# Patient Record
Sex: Male | Born: 1940 | Race: White | Hispanic: No | Marital: Married | Smoking: Former smoker
Health system: Southern US, Community
[De-identification: ages and names within clinical notes are randomized; demographics above are authoritative.]

## PROBLEM LIST (undated history)

## (undated) DIAGNOSIS — I10 Essential (primary) hypertension: Secondary | ICD-10-CM

## (undated) DIAGNOSIS — R0600 Dyspnea, unspecified: Secondary | ICD-10-CM

## (undated) DIAGNOSIS — S76112A Strain of left quadriceps muscle, fascia and tendon, initial encounter: Secondary | ICD-10-CM

## (undated) DIAGNOSIS — G912 (Idiopathic) normal pressure hydrocephalus: Secondary | ICD-10-CM

## (undated) DIAGNOSIS — M1990 Unspecified osteoarthritis, unspecified site: Secondary | ICD-10-CM

## (undated) DIAGNOSIS — F32A Depression, unspecified: Secondary | ICD-10-CM

## (undated) DIAGNOSIS — R32 Unspecified urinary incontinence: Secondary | ICD-10-CM

## (undated) DIAGNOSIS — F329 Major depressive disorder, single episode, unspecified: Secondary | ICD-10-CM

## (undated) DIAGNOSIS — R7303 Prediabetes: Secondary | ICD-10-CM

## (undated) DIAGNOSIS — R011 Cardiac murmur, unspecified: Secondary | ICD-10-CM

## (undated) DIAGNOSIS — K219 Gastro-esophageal reflux disease without esophagitis: Secondary | ICD-10-CM

## (undated) DIAGNOSIS — K5790 Diverticulosis of intestine, part unspecified, without perforation or abscess without bleeding: Secondary | ICD-10-CM

## (undated) DIAGNOSIS — N400 Enlarged prostate without lower urinary tract symptoms: Secondary | ICD-10-CM

## (undated) DIAGNOSIS — Z8489 Family history of other specified conditions: Secondary | ICD-10-CM

## (undated) DIAGNOSIS — C4491 Basal cell carcinoma of skin, unspecified: Secondary | ICD-10-CM

## (undated) DIAGNOSIS — I350 Nonrheumatic aortic (valve) stenosis: Secondary | ICD-10-CM

## (undated) HISTORY — PX: ANAL FISSURE REPAIR: SHX2312

## (undated) HISTORY — DX: Unspecified osteoarthritis, unspecified site: M19.90

## (undated) HISTORY — PX: TONSILLECTOMY: SUR1361

## (undated) HISTORY — DX: Benign prostatic hyperplasia without lower urinary tract symptoms: N40.0

## (undated) HISTORY — PX: MICRODISCECTOMY LUMBAR: SUR864

## (undated) HISTORY — PX: CARDIAC CATHETERIZATION: SHX172

## (undated) HISTORY — DX: Essential (primary) hypertension: I10

## (undated) HISTORY — PX: OTHER SURGICAL HISTORY: SHX169

## (undated) HISTORY — DX: Diverticulosis of intestine, part unspecified, without perforation or abscess without bleeding: K57.90

## (undated) HISTORY — PX: ELBOW SURGERY: SHX618

## (undated) HISTORY — PX: COLONOSCOPY W/ POLYPECTOMY: SHX1380

## (undated) HISTORY — DX: Basal cell carcinoma of skin, unspecified: C44.91

## (undated) HISTORY — DX: Gastro-esophageal reflux disease without esophagitis: K21.9

## (undated) HISTORY — PX: LEG SURGERY: SHX1003

## (undated) HISTORY — PX: FINGER SURGERY: SHX640

## (undated) HISTORY — DX: Nonrheumatic aortic (valve) stenosis: I35.0

## (undated) HISTORY — PX: BACK SURGERY: SHX140

---

## 1998-04-25 ENCOUNTER — Other Ambulatory Visit: Admission: RE | Admit: 1998-04-25 | Discharge: 1998-04-25 | Payer: Self-pay | Admitting: Family Medicine

## 1998-06-07 ENCOUNTER — Ambulatory Visit (HOSPITAL_COMMUNITY): Admission: RE | Admit: 1998-06-07 | Discharge: 1998-06-07 | Payer: Self-pay | Admitting: Gastroenterology

## 1999-01-04 ENCOUNTER — Encounter: Admission: RE | Admit: 1999-01-04 | Discharge: 1999-01-04 | Payer: Self-pay | Admitting: Family Medicine

## 1999-01-04 ENCOUNTER — Encounter: Payer: Self-pay | Admitting: Family Medicine

## 1999-02-28 ENCOUNTER — Encounter: Payer: Self-pay | Admitting: Family Medicine

## 1999-02-28 ENCOUNTER — Encounter: Admission: RE | Admit: 1999-02-28 | Discharge: 1999-02-28 | Payer: Self-pay | Admitting: Family Medicine

## 2000-08-19 ENCOUNTER — Encounter: Payer: Self-pay | Admitting: Allergy and Immunology

## 2000-08-19 ENCOUNTER — Encounter: Admission: RE | Admit: 2000-08-19 | Discharge: 2000-08-19 | Payer: Self-pay | Admitting: *Deleted

## 2001-03-13 ENCOUNTER — Encounter: Payer: Self-pay | Admitting: Family Medicine

## 2001-03-13 ENCOUNTER — Encounter: Admission: RE | Admit: 2001-03-13 | Discharge: 2001-03-13 | Payer: Self-pay | Admitting: Family Medicine

## 2001-07-29 ENCOUNTER — Ambulatory Visit (HOSPITAL_BASED_OUTPATIENT_CLINIC_OR_DEPARTMENT_OTHER): Admission: RE | Admit: 2001-07-29 | Discharge: 2001-07-29 | Payer: Self-pay | Admitting: Surgery

## 2001-11-02 ENCOUNTER — Ambulatory Visit (HOSPITAL_COMMUNITY): Admission: RE | Admit: 2001-11-02 | Discharge: 2001-11-02 | Payer: Self-pay | Admitting: Gastroenterology

## 2007-02-27 ENCOUNTER — Emergency Department (HOSPITAL_COMMUNITY): Admission: EM | Admit: 2007-02-27 | Discharge: 2007-02-27 | Payer: Self-pay | Admitting: Emergency Medicine

## 2007-03-04 ENCOUNTER — Ambulatory Visit (HOSPITAL_BASED_OUTPATIENT_CLINIC_OR_DEPARTMENT_OTHER): Admission: RE | Admit: 2007-03-04 | Discharge: 2007-03-04 | Payer: Self-pay | Admitting: Orthopedic Surgery

## 2008-03-01 ENCOUNTER — Encounter: Admission: RE | Admit: 2008-03-01 | Discharge: 2008-03-01 | Payer: Self-pay | Admitting: Family Medicine

## 2008-07-27 ENCOUNTER — Encounter: Admission: RE | Admit: 2008-07-27 | Discharge: 2008-07-27 | Payer: Self-pay | Admitting: Orthopedic Surgery

## 2008-08-12 ENCOUNTER — Ambulatory Visit (HOSPITAL_BASED_OUTPATIENT_CLINIC_OR_DEPARTMENT_OTHER): Admission: RE | Admit: 2008-08-12 | Discharge: 2008-08-12 | Payer: Self-pay | Admitting: Orthopedic Surgery

## 2008-11-01 ENCOUNTER — Ambulatory Visit (HOSPITAL_BASED_OUTPATIENT_CLINIC_OR_DEPARTMENT_OTHER): Admission: RE | Admit: 2008-11-01 | Discharge: 2008-11-01 | Payer: Self-pay | Admitting: Orthopedic Surgery

## 2008-11-01 ENCOUNTER — Encounter (INDEPENDENT_AMBULATORY_CARE_PROVIDER_SITE_OTHER): Payer: Self-pay | Admitting: Orthopedic Surgery

## 2010-03-09 ENCOUNTER — Ambulatory Visit (HOSPITAL_COMMUNITY)
Admission: RE | Admit: 2010-03-09 | Discharge: 2010-03-09 | Disposition: A | Discharge status: Home / Self Care | Payer: Medicare Other | Source: Ambulatory Visit | Attending: Neurological Surgery | Admitting: Neurological Surgery

## 2010-03-09 ENCOUNTER — Other Ambulatory Visit (HOSPITAL_COMMUNITY): Payer: Self-pay | Admitting: Neurological Surgery

## 2010-03-09 ENCOUNTER — Encounter (HOSPITAL_COMMUNITY)
Admission: RE | Admit: 2010-03-09 | Discharge: 2010-03-09 | Disposition: A | Discharge status: Home / Self Care | Payer: Medicare Other | Source: Ambulatory Visit | Attending: Neurological Surgery | Admitting: Neurological Surgery

## 2010-03-09 DIAGNOSIS — Z01818 Encounter for other preprocedural examination: Secondary | ICD-10-CM | POA: Insufficient documentation

## 2010-03-09 DIAGNOSIS — I10 Essential (primary) hypertension: Secondary | ICD-10-CM | POA: Insufficient documentation

## 2010-03-09 DIAGNOSIS — Z01811 Encounter for preprocedural respiratory examination: Secondary | ICD-10-CM

## 2010-03-09 LAB — COMPREHENSIVE METABOLIC PANEL
ALT: 22 U/L (ref 0–53)
AST: 20 U/L (ref 0–37)
Albumin: 3.4 g/dL — ABNORMAL LOW (ref 3.5–5.2)
CO2: 28 mEq/L (ref 19–32)
Calcium: 9.5 mg/dL (ref 8.4–10.5)
GFR calc Af Amer: >60 mL/min (ref >60)
GFR calc non Af Amer: >60 mL/min (ref >60)
Sodium: 141 mEq/L (ref 135–145)

## 2010-03-09 LAB — CBC
Hemoglobin: 13.8 g/dL (ref 13.0–17.0)
MCHC: 33.8 g/dL (ref 30.0–36.0)
Platelets: 269 10*3/uL (ref 150–400)
RBC: 4.85 MIL/uL (ref 4.22–5.81)

## 2010-03-15 ENCOUNTER — Observation Stay (HOSPITAL_COMMUNITY)
Admission: RE | Admit: 2010-03-15 | Discharge: 2010-03-16 | Disposition: A | Discharge status: Home / Self Care | Payer: Medicare Other | Attending: Neurological Surgery | Admitting: Neurological Surgery

## 2010-03-15 ENCOUNTER — Inpatient Hospital Stay (HOSPITAL_COMMUNITY): Payer: Medicare Other

## 2010-03-15 DIAGNOSIS — M216X9 Other acquired deformities of unspecified foot: Secondary | ICD-10-CM | POA: Insufficient documentation

## 2010-03-15 DIAGNOSIS — M5126 Other intervertebral disc displacement, lumbar region: Principal | ICD-10-CM | POA: Insufficient documentation

## 2010-03-15 LAB — PROTIME-INR
INR: 1.03 (ref 0.00–1.49)
Prothrombin Time: 13.7 seconds (ref 11.6–15.2)

## 2010-03-15 LAB — APTT: aPTT: 29 seconds (ref 24–37)

## 2010-03-16 NOTE — Op Note (Signed)
Joseph [name], Joseph [name]               ACCOUNT NO.:  [id number]  MEDICAL RECORD NO.:  [id number]           PATIENT TYPE:  I  LOCATION:  3526                         FACILITY:  [facility]  PHYSICIAN:  Tia Alert, MD     DATE OF BIRTH:  [birth year]  DATE OF PROCEDURE:  03/15/2010 DATE OF DISCHARGE:                              OPERATIVE REPORT   PREOPERATIVE DIAGNOSIS:  Right L5-S1 herniated nucleus pulposus with free fragment extending to the pedicle level causing both an L5 and a S1 radiculopathy with a partial right footdrop.  POSTOPERATIVE DIAGNOSIS:  Right L5-S1 herniated nucleus pulposus with free fragment extending to the pedicle level causing both an L5 and a S1 radiculopathy with a partial right footdrop.  PROCEDURE:  Right lumbar hemilaminectomy, medial facetectomy with foraminotomy for decompression of both the L5 and the S1 nerve roots with microdiskectomy at L5-S1 on the right utilizing microscopic dissection.  SURGEON:  Tia Alert, MD  ASSISTANT:  Reinaldo Meeker, MD  ANESTHESIA:  General endotracheal.  COMPLICATIONS:  None apparent.  INDICATIONS FOR PROCEDURE:  Joseph [name] is a 70 year old gentleman who presented with right leg pain with a footdrop on the right.  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 Because of his weakness in his foot and the severity of this pain, I recommended a microdiskectomy at L5-S1 on the right side in hopes of improving his pain syndrome and improving the weakness in his foot.  He understood the risks, benefits, expected outcome and wished to proceed.  DESCRIPTION OF PROCEDURE:  The patient was taken to the operating room and after induction of adequate generalized endotracheal anesthesia, he was rolled in prone position on the Wilson frame and all pressure  points were padded.  His back was prepped with Hibiclens followed by DuraPrep and then draped in usual sterile fashion.  5 mL of local anesthesia was injected and a dorsal midline incision was made and carried down to the lumbosacral fascia.  The fascia was opened the paraspinous musculature was taken in subperiosteal fashion to expose L5-S1.  Intraoperative x- ray confirmed my level and then I used a Kerrison punch to perform a right hemilaminectomy, medial facetectomy, and foraminotomy.  The underlying yellow ligament was opened and removed.  The underlying epidural lipomatosis was removed with both suction and a nerve hook.  I was able to identify the lateral recess, identify the disk space, identify the S1 nerve root.  I marched up and undercut the pars until I identified the L5 nerve root and decompressed it distally into its foramen.  I undercut the lateral recess to decompress the lateral recess.  I brought in the operating microscope.  I retracted at the dura medially with a D'Errico retractor.  I was able to identify a large free fragment up under the L5 nerve root.  I was able to remove this in a piecemeal fashion with a very large fragment coming out with a nerve hook  and a pituitary rongeur.  Once this was done, I could see a cavity where this disk had been, I could feel up under the midline and along the L5 nerve root.  I felt no further compression.  I did not see a significant annular tear and did not feel an intradiscal diskectomy was necessary.  I, therefore, made sure the L5 and the S1 nerve roots were well decompressed.  I irrigated it with saline solution, bacitracin, dried all bleeding points, lined the dura with Gelfoam, removed the retractor, and then closed the fascia with 0 Vicryl, closed the subcutaneous and subcuticular tissue with 2-0 and 3-0 Vicryl, and closed the skin with Benzoin and Steri-Strips.  The drapes were removed, sterile dressing was applied, the  patient was awakened from general anesthesia and transferred to recovery room in stable condition.  At the end of the procedure, all sponge, needle, and instrument counts were correct.     Tia Alert, MD     DSJ/MEDQ  D:  03/15/2010  T:  03/16/2010  Job:  [id number]  Electronically Signed by Marikay Alar MD on 03/16/2010 05:02:41 PM

## 2010-04-26 LAB — POCT I-STAT, CHEM 8
BUN: 18 mg/dL (ref 6–23)
Chloride: 102 mEq/L (ref 96–112)
HCT: 45 % (ref 39.0–52.0)
Potassium: 4 mEq/L (ref 3.5–5.1)
Sodium: 137 mEq/L (ref 135–145)

## 2010-04-26 LAB — BASIC METABOLIC PANEL
Calcium: 8.7 mg/dL (ref 8.4–10.5)
GFR calc Af Amer: >60 mL/min (ref >60)
GFR calc non Af Amer: >60 mL/min (ref >60)
Potassium: 3.5 mEq/L (ref 3.5–5.1)
Sodium: 137 mEq/L (ref 135–145)

## 2010-04-29 LAB — BASIC METABOLIC PANEL
CO2: 24 mEq/L (ref 19–32)
Chloride: 106 mEq/L (ref 96–112)
Creatinine, Ser: 1.03 mg/dL (ref 0.4–1.5)
GFR calc Af Amer: >60 mL/min (ref >60)
Sodium: 139 mEq/L (ref 135–145)

## 2010-04-29 LAB — POCT HEMOGLOBIN-HEMACUE: Hemoglobin: 12.2 g/dL — ABNORMAL LOW (ref 13.0–17.0)

## 2010-06-05 NOTE — Op Note (Signed)
Joseph [name], Joseph [name]               ACCOUNT NO.:  [id number]   MEDICAL RECORD NO.:  [id number]          PATIENT TYPE:  AMB   LOCATION:  DSC                          FACILITY:  [facility]   PHYSICIAN:  Harvie Junior, M.D.   DATE OF BIRTH:  [birth year]   DATE OF PROCEDURE:  03/04/2007  DATE OF DISCHARGE:                               OPERATIVE REPORT   PREOPERATIVE DIAGNOSIS:  Quad tendon rupture right.   POSTOPERATIVE DIAGNOSIS:  Quad tendon rupture right.   PROCEDURE:  1. Right quad tendon repair with Fiberwire suture through drill holes      in the patella.  2. Thorough irrigation and debridement of the right knee joint.   SURGEON:  Harvie Junior, M.D.   ASSISTANT:  None.   ANESTHESIA:  General.   BRIEF HISTORY:  The patient is a 70 year old male with a long history of  giving way when he got up off of the couch.  He went to the emergency  room where they took some x-rays, were uncertain of the diagnosis and he  was sent for evaluation.  We felt that he had a quad tendon rupture.  We  talked about treatment options and ultimately felt that he needed to be  taken to the operating room for quad tendon repair given the big defect  that he had and inability to do a straight leg raise.  He is brought to  the operating room for this procedure.   DESCRIPTION OF PROCEDURE:  The patient was brought to the operating room  and after adequate anesthesia was obtained with general anesthesia, the  patient was placed supine on the operating table.  The right leg was  prepped and draped in the usual sterile fashion.  Following this, the  leg was exsanguinated and tourniquet was inflated to 350 mmHg.  Following this a midline incision was made.  Subcutaneous tissues were  dissected down to the level of the quad tendon.  The quad tendon was  completely torn from way medial in the retinacular area to way lateral  in the retinacular area and the complete quad tendon was torn.  At this  point the  #5 Fiberwire stitch was passed Bennell style centrally in the  tendon and then a #2 medially and laterally.  Four drill holes were made  through the patella and the corresponding sutures were passed through  the drill holes and with the knee held in about 60 degrees of flexion,  the stitches were tied down. Excellent anatomic repair of the quad  tendon to the patella was achieved and when this was completed, the  retinaculum medially and laterally was closed after a thorough  irrigation of the joint.  The joint was completely open and we did a  thorough irrigation.  At this point the knee was copiously and  thoroughly irrigated and suctioned dry prior to the sutures being tied.  The retinaculum is repaired as outlined.  At this point the skin was closed with 0 and 2-0 Vicryl and skin with  skin staples.  A sterile compressive dressing was applied as well  as EZ  Wrap and knee immobilizer.  The patient was taken to the recovery room  where she was noted to be in satisfactory condition.  Estimated blood  loss for the procedure was none.      Harvie Junior, M.D.  Electronically Signed     JLG/MEDQ  D:  03/04/2007  T:  03/05/2007  Job:  [id number]   cc:   Hardin Negus

## 2010-06-05 NOTE — Op Note (Signed)
Joseph [name], Joseph [name]               ACCOUNT NO.:  [id number]   MEDICAL RECORD NO.:  [id number]          PATIENT TYPE:  AMB   LOCATION:  DSC                          FACILITY:  [facility]   PHYSICIAN:  Harvie Junior, M.D.   DATE OF BIRTH:  [birth year]   DATE OF PROCEDURE:  08/12/2008  DATE OF DISCHARGE:                               OPERATIVE REPORT   PREOPERATIVE DIAGNOSES:  1. Medial meniscal tear.  2. Loose body.   POSTOPERATIVE DIAGNOSES:  1. Medial meniscal tear.  2. Loose body.  3. Chondromalacia patella.  4. Retained FiberWire suture in the suprapatellar pouch.   PROCEDURE:  1. Partial posterior horn medial meniscectomy.  2. Removal of loose body.  3. Removal of foreign suture material.  4. Debridement of chondromalacia patella.   SURGEON:  Harvie Junior, MD   ASSISTANT:  Marshia Ly, PA   ANESTHESIA:  General.   BRIEF HISTORY:  [name] is a 70 year old male with a long history of  having significant right knee pain.  He had had a previous quad tendon  repair and had done well.  There was little bit of a defect kind of in  the mid lateral area.  MRI showed that the quad was actually healed, but  did show meniscal tear and loose body.  We felt like this could be the  said cause of his giving way which is why he was complaining, felt like  it could be related to what I thought was a little bit of a defect in  the quad but ultimately felt that the most effective course of action  would be to debride this medial meniscal tear and see if we could help.   PROCEDURE IN DETAIL:  The patient was brought to the operating room.  After adequate anesthesia was obtained with general anesthetic, the  patient was placed supine on the operating table.  The right leg was  prepped and draped in usual sterile fashion.  Following this, a routine  arthroscopic examination of knee revealed that there was an obvious  posterior horn medial meniscal tear.  This was debrided back to a  smooth  and stable rim.  There was a osteocartilaginous loose body in the  posteromedial areas as was seen by MRI.  This was removed with a  grasper.  Following this, attention was then turned to the medial  femoral condyle which had grade 3 changes.  This was debrided.  Attention was then turned up into the ACL which was normal.  Lateral  side was normal.  Attention was turned to the patella which had some  chondromalacia.  This was debrided back to a smooth and stable rim.  Attention was then turned to the suprapatellar pouch.  There was a bunch  of FiberWire suture hanging down this area.  We made an accessory portal  superolaterally and then we were able to get a arthroscopic scissor into  there and cut the FiberWire suture.  There were multiple strands, about  4 strand.  We cut these and then pulled the suture material out the  inferolateral portal.  At this point, the knee was copiously and  thoroughly irrigated and suctioned dried.  The arthroscopic portals were  closed with a bandage.  Sterile compression was applied.  The patient  was taken to the recovery room and was noted to be in satisfactory  condition.  Estimated blood loss for this procedure was none.      Harvie Junior, M.D.  Electronically Signed     JLG/MEDQ  D:  08/12/2008  T:  08/13/2008  Job:  [id number]
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
## 2010-06-08 NOTE — Op Note (Signed)
   NAME:  Joseph [name], Joseph [name] NO.:  [id number]   MEDICAL RECORD NO.:  [id number]                   PATIENT TYPE:  AMB   LOCATION:  ENDO                                 FACILITY:  [facility]   PHYSICIAN:  Danise Edge, M.D.                DATE OF BIRTH:  1940-08-01   DATE OF PROCEDURE:  11/02/2001  DATE OF DISCHARGE:                                 OPERATIVE REPORT   PROCEDURE:  Surveillance colonoscopy.   INDICATIONS FOR PROCEDURE:  Mr. Zandyr Barnhill is a 70 year old male born  April 09, 1940. Mr. Laventure underwent a colonoscopy with removal of a  neoplastic polyp approximately three years ago. He is scheduled for  surveillance colonoscopy with polypectomy to prevent colon cancer.   ENDOSCOPIST:  Charolett Bumpers, M.D.   PREMEDICATION:  Versed 10 mg, Demerol 50 mg .   ENDOSCOPE:  Olympus pediatric colonoscope.   DESCRIPTION OF PROCEDURE:  After obtaining informed consent, Mr. Gervasi was  placed in the left lateral decubitus position. I administered intravenous  Demerol and intravenous Versed to achieve conscious sedation for the  procedure. The patient's blood pressure, oxygen saturation and cardiac  rhythm were monitored throughout the procedure and documented in the medical  record.   Anal inspection was normal. Digital rectal exam was normal. The Olympus  pediatric video colonoscope was introduced into the rectum and advanced to  the cecum. Colonic preparation for the exam today was satisfactory.   RECTUM:  Normal.   SIGMOID COLON AND DESCENDING COLON:  Normal.   SPLENIC FLEXURE:  Normal.   TRANSVERSE COLON:  Normal.   HEPATIC FLEXURE:  Normal.   ASCENDING COLON:  Normal.   CECUM AND ILEOCECAL VALVE:  Normal.    ASSESSMENT:  Normal surveillance proctocolonoscopy to the cecum.   RECOMMENDATIONS:  Repeat colonoscopy in five years.                                                Danise Edge, M.D.    MJ/MEDQ  D:  11/02/2001  T:   11/02/2001  Job:  [id number]   cc:   Bryan Lemma. Manus Gunning, M.D.  [address] [place]  [place] [address]  Fax: [phone]

## 2010-10-12 LAB — POCT HEMOGLOBIN-HEMACUE: Hemoglobin: 14.7

## 2010-10-12 LAB — BASIC METABOLIC PANEL
CO2: 26
Calcium: 9
Chloride: 105
Creatinine, Ser: 1.18
GFR calc Af Amer: >60
Sodium: 137

## 2011-02-14 ENCOUNTER — Other Ambulatory Visit: Payer: Self-pay | Admitting: Family Medicine

## 2011-10-11 ENCOUNTER — Other Ambulatory Visit: Payer: Self-pay | Admitting: Neurological Surgery

## 2011-10-11 DIAGNOSIS — M545 Low back pain: Secondary | ICD-10-CM

## 2011-10-17 ENCOUNTER — Ambulatory Visit
Admission: RE | Admit: 2011-10-17 | Discharge: 2011-10-17 | Disposition: A | Discharge status: Home / Self Care | Payer: Medicare Other | Source: Ambulatory Visit | Attending: Neurological Surgery | Admitting: Neurological Surgery

## 2011-10-17 DIAGNOSIS — M545 Low back pain: Secondary | ICD-10-CM

## 2011-10-17 MED ORDER — GADOBENATE DIMEGLUMINE 529 MG/ML IV SOLN
20.0000 mL | Freq: Once | INTRAVENOUS | Status: AC | PRN
  Administered 2011-10-17: 20 mL via INTRAVENOUS

## 2012-12-31 ENCOUNTER — Other Ambulatory Visit: Payer: Self-pay | Admitting: Gastroenterology

## 2013-08-13 ENCOUNTER — Ambulatory Visit (HOSPITAL_COMMUNITY): Payer: Medicare Other | Attending: Family Medicine | Admitting: Radiology

## 2013-08-13 ENCOUNTER — Other Ambulatory Visit (HOSPITAL_COMMUNITY): Payer: Self-pay | Admitting: Family Medicine

## 2013-08-13 DIAGNOSIS — E669 Obesity, unspecified: Secondary | ICD-10-CM | POA: Diagnosis not present

## 2013-08-13 DIAGNOSIS — I359 Nonrheumatic aortic valve disorder, unspecified: Secondary | ICD-10-CM | POA: Insufficient documentation

## 2013-08-13 DIAGNOSIS — I059 Rheumatic mitral valve disease, unspecified: Secondary | ICD-10-CM | POA: Insufficient documentation

## 2013-08-13 NOTE — Progress Notes (Signed)
Echocardiogram performed.  

## 2013-11-18 ENCOUNTER — Encounter: Payer: Self-pay | Admitting: Neurology

## 2013-11-18 ENCOUNTER — Ambulatory Visit (INDEPENDENT_AMBULATORY_CARE_PROVIDER_SITE_OTHER): Payer: Medicare Other | Admitting: Neurology

## 2013-11-18 VITALS — BP 138/70 | HR 76 | Ht 69.0 in | Wt 256.0 lb

## 2013-11-18 DIAGNOSIS — R2681 Unsteadiness on feet: Secondary | ICD-10-CM

## 2013-11-18 DIAGNOSIS — R202 Paresthesia of skin: Secondary | ICD-10-CM

## 2013-11-18 DIAGNOSIS — I10 Essential (primary) hypertension: Secondary | ICD-10-CM | POA: Insufficient documentation

## 2013-11-18 DIAGNOSIS — R251 Tremor, unspecified: Secondary | ICD-10-CM

## 2013-11-18 DIAGNOSIS — G214 Vascular parkinsonism: Secondary | ICD-10-CM | POA: Insufficient documentation

## 2013-11-18 DIAGNOSIS — W19XXXA Unspecified fall, initial encounter: Secondary | ICD-10-CM
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
## 2013-11-18 NOTE — Patient Instructions (Signed)
1. We have scheduled you at [facility] for your MRI on 12/02/13 at 2:00 pm. Please arrive 15 minutes prior and go to 1st floor radiology. If you need to reschedule for any reason please call [phone]. 2. We will call you with appt for your EMG.

## 2013-11-25 ENCOUNTER — Ambulatory Visit (INDEPENDENT_AMBULATORY_CARE_PROVIDER_SITE_OTHER): Payer: Medicare Other | Admitting: Neurology

## 2013-11-25 DIAGNOSIS — G5603 Carpal tunnel syndrome, bilateral upper limbs: Secondary | ICD-10-CM

## 2013-11-25 DIAGNOSIS — R202 Paresthesia of skin: Secondary | ICD-10-CM

## 2013-11-25 DIAGNOSIS — G5622 Lesion of ulnar nerve, left upper limb: Secondary | ICD-10-CM

## 2013-11-25 NOTE — Procedures (Signed)
[facility]  [place], [place]  [place], [place] [address] Tel: [phone] Fax:  [phone] Test Date:  11/25/2013  Patient: Joseph [name] DOB: [birth year] Physician: Narda Amber, DO  Sex: Male Height: 5\' 10"  Ref Phys: Alonza Bogus  ID#: [id number] Temp: 35.0C Technician:    Patient Complaints: This is 73 year-old gentleman presenting for evaluation of bilateral hand paresthesias, worse on the right.  NCV & EMG Findings: Extensive electrodiagnostic testing of the right upper extremity and additional studies of the left reveals:  1. Right palmar study is abnormal. The right median and ulnar sensory responses are within normal limits. Right median and ulnar motor responses are within normal limits. 2. Left median sensory response is prolonged with normal amplitude and motor response. Left ulnar sensory response shows reduced amplitude and preserved latency. The dorsal ulnar cutaneous sensory responses also prolonged and reduced in amplitude. Left ulnar motor response shows slowed conduction velocity across the elbow. 3. Chronic motor axonal loss changes are seen affecting the left abductor pollicis brevis and flexor digitorum profundus 4 & 5 muscles, without accompanying active denervation.  Impression: 1. Right median neuropathy at or distal to the wrist consistent with the clinical diagnosis of carpal tunnel syndrome, mild in degree electrically.  2. Left median neuropathy at or distal to the wrist consistent with clinical diagnosis of carpal tunnel syndrome, moderate in degree electrically. 3. Left ulnar neuropathy with slowing across the elbow, demyelinating and axonal loss type.   ___________________________ Narda Amber, DO    Nerve Conduction Studies Anti Sensory Summary Table   Site NR Peak (ms) Norm Peak (ms) P-T Amp (V) Norm P-T Amp  Left DorsCutan Anti Sensory (Dorsum 5th MC)  Wrist    4.0 <3.2 4.4 >5  Left Median Anti Sensory (2nd Digit)  Wrist     4.4 <3.8 13.3 >10  Right Median Anti Sensory (2nd Digit)  Wrist    3.8 <3.8 11.6 >10  Left Ulnar Anti Sensory (5th Digit)  Wrist    3.1 <3.2 3.3 >5  Right Ulnar Anti Sensory (5th Digit)  Wrist    3.0 <3.2 10.9 >5   Motor Summary Table   Site NR Onset (ms) Norm Onset (ms) O-P Amp (mV) Norm O-P Amp Site1 Site2 Delta-0 (ms) Dist (cm) Vel (m/s) Norm Vel (m/s)  Left Median Motor (Abd Poll Brev)  Wrist    4.2 <4.0 7.1 >5 Elbow Wrist 6.0 30.0 50 >50  Elbow    10.2  6.9         Right Median Motor (Abd Poll Brev)  Wrist    4.0 <4.0 8.8 >5 Elbow Wrist 5.9 30.0 51 >50  Elbow    9.9  8.5         Left Ulnar Motor (Abd Dig Minimi)  Wrist    2.6 <3.1 9.5 >7 B Elbow Wrist 5.2 28.0 54 >50  B Elbow    7.8  8.2  A Elbow B Elbow 2.7 10.0 37 >50  A Elbow    10.5  7.8         Right Ulnar Motor (Abd Dig Minimi)  Wrist    2.2 <3.1 10.0 >7 B Elbow Wrist 4.8 26.0 54 >50  B Elbow    7.0  9.7  A Elbow B Elbow 2.2 11.0 50 >50  A Elbow    9.2  9.5         Left Ulnar (FDI) Motor (1st DI)  Wrist    4.4 <4.5 8.9 >  7         Comparison Summary Table   Site NR Peak (ms) Norm Peak (ms) P-T Amp (V) Site1 Site2 Delta-P (ms) Norm Delta (ms)  Right Median/Ulnar Palm Comparison (Wrist - 8cm)  Median Palm    2.3 <2.2 20.8 Median Palm Ulnar Palm 0.4   Ulnar Palm    1.9 <2.2 6.5       EMG   Side Muscle Ins Act Fibs Psw Fasc Number Recrt Dur Dur. Amp Amp. Poly Poly. Comment  Right 1stDorInt Nml Nml Nml Nml Nml Nml Nml Nml Nml Nml Nml Nml N/A  Right Abd Poll Brev Nml Nml Nml Nml Nml Nml Nml Nml Nml Nml Nml Nml N/A  Right PronatorTeres Nml Nml Nml Nml Nml Nml Nml Nml Nml Nml Nml Nml N/A  Right Biceps Nml Nml Nml Nml Nml Nml Nml Nml Nml Nml Nml Nml N/A  Right Triceps Nml Nml Nml Nml Nml Nml Nml Nml Nml Nml Nml Nml N/A  Right Deltoid Nml Nml Nml Nml Nml Nml Nml Nml Nml Nml Nml Nml N/A  Right Ext Indicis Nml Nml Nml Nml Nml Nml Nml Nml Nml Nml Nml Nml N/A  Left Triceps Nml Nml Nml Nml Nml Nml Nml Nml Nml Nml Nml Nml N/A   Left 1stDorInt Nml Nml Nml Nml Nml Nml Nml Nml Nml Nml Nml Nml N/A  Left ABD Dig Min Nml Nml Nml Nml Nml Nml Nml Nml Nml Nml Nml Nml N/A  Left Abd Poll Brev Nml Nml Nml Nml 1- Mod-R Few 1+ Nml Nml Nml Nml N/A  Left FlexDigProf 4,5 Nml Nml Nml Nml 1- Mod-R Few 1+ Few 1+ Nml Nml N/A      Waveforms:

## 2013-11-26 ENCOUNTER — Telehealth: Payer: Self-pay | Admitting: *Deleted

## 2013-11-26 NOTE — Telephone Encounter (Signed)
-----   Message from [place], DO sent at 11/26/2013  8:56 AM EST ----- Let pt know that EMG showed b/l CTS and pinched nerve at elbow on L as well.  For now, give RX for cock up wrist splints (get at [facility] where they will fit him) and wear at least nightly.

## 2013-11-26 NOTE — Telephone Encounter (Signed)
Left message for pateint to return call regards results

## 2013-12-02 ENCOUNTER — Ambulatory Visit (HOSPITAL_COMMUNITY)
Admission: RE | Admit: 2013-12-02 | Discharge: 2013-12-02 | Disposition: A | Discharge status: Home / Self Care | Payer: Medicare Other | Source: Ambulatory Visit | Attending: Neurology | Admitting: Neurology

## 2013-12-02 DIAGNOSIS — G458 Other transient cerebral ischemic attacks and related syndromes: Secondary | ICD-10-CM | POA: Diagnosis not present

## 2013-12-02 DIAGNOSIS — R2681 Unsteadiness on feet: Secondary | ICD-10-CM

## 2013-12-02 DIAGNOSIS — R260 Ataxic gait: Secondary | ICD-10-CM | POA: Diagnosis present

## 2013-12-02 DIAGNOSIS — G319 Degenerative disease of nervous system, unspecified: Secondary | ICD-10-CM | POA: Insufficient documentation

## 2013-12-02 DIAGNOSIS — W19XXXA Unspecified fall, initial encounter: Secondary | ICD-10-CM

## 2013-12-02 DIAGNOSIS — R251 Tremor, unspecified: Secondary | ICD-10-CM

## 2013-12-03 ENCOUNTER — Other Ambulatory Visit: Payer: Self-pay

## 2013-12-07 ENCOUNTER — Encounter: Payer: Self-pay | Admitting: Neurology

## 2013-12-07 ENCOUNTER — Ambulatory Visit (INDEPENDENT_AMBULATORY_CARE_PROVIDER_SITE_OTHER): Payer: Medicare Other | Admitting: Neurology

## 2013-12-07 VITALS — BP 124/82 | HR 108 | Resp 18 | Ht 69.0 in | Wt 256.0 lb

## 2013-12-07 DIAGNOSIS — G20 Parkinson's disease: Secondary | ICD-10-CM

## 2013-12-07 DIAGNOSIS — G5602 Carpal tunnel syndrome, left upper limb: Secondary | ICD-10-CM

## 2013-12-07 DIAGNOSIS — G912 (Idiopathic) normal pressure hydrocephalus: Secondary | ICD-10-CM

## 2013-12-07 DIAGNOSIS — G5603 Carpal tunnel syndrome, bilateral upper limbs: Secondary | ICD-10-CM

## 2013-12-07 DIAGNOSIS — R9402 Abnormal brain scan: Secondary | ICD-10-CM

## 2013-12-07 DIAGNOSIS — G5601 Carpal tunnel syndrome, right upper limb: Secondary | ICD-10-CM

## 2013-12-07 MED ORDER — WRIST SPLINT/COCK-UP/RIGHT L MISC: 1.0000 | Freq: Every day | Status: DC

## 2013-12-07 MED ORDER — WRIST SPLINT/COCK-UP/LEFT L MISC: 1.0000 | Freq: Every day | Status: DC

## 2013-12-07 NOTE — Progress Notes (Signed)
Joseph [name] was seen today in the movement disorders clinic for neurologic consultation at the request of Simona Huh, MD.  The consultation is for the evaluation of balance changes, tremor and to r/o PD.  Pt is accompanied by his wife who supplements the history.    Pt states that the balance issues started first and he attributed it to a knee injury 5 years ago, but it has been worse over the last 6 months.  One time, he fell down a step.  One time, he fell off a chair a wheels.  One time, he tripped over his cane (yesterday).  He has had no fx's with these.  About 3 weeks ago, he noted tremor but he has noted only a few episodes.  His wife said that she noted only a single episode and it when he was holding the paper in the R hand.  He only noted one or two episodes.   He is R hand dominant.  12/07/13 update:  The patient has a history of parkinsonism.  He had an MRI of the brain since last visit that I had the opportunity to review.  There is evidence of moderate small vessel disease.  The ventricles were slightly enlarged and were just slightly out of proportion to the amount of atrophy.  No falls since last visit.  Admits to some incontinence.  Minimal memory changes.  Going out of town and wife would like to rent a scooter and asks me if that is a possibility.  They are moving to [place] soon.    He has has a history of hand paresthesias.  EMG demonstrated bilateral carpal tunnel syndrome.  The right side was mild and the left was moderate.  There is also evidence of ulnar neuropathy at the elbow on the left.  Pt reports that this is not very bothersome to him.    MRI brain reviewed with patient and wife today and films shown to him.  PREVIOUS MEDICATIONS: none to date  ALLERGIES:   Allergies  Allergen Reactions  . Ace Inhibitors Cough  . Atenolol   . Cozaar [Losartan Potassium]   . Norvasc [Amlodipine]   . Prilosec [Omeprazole] Rash    CURRENT MEDICATIONS:  Outpatient  Encounter Prescriptions as of 12/07/2013  Medication Sig  . acetaminophen (TYLENOL) 500 MG tablet Take 500 mg by mouth every 6 (six) hours as needed.  Marland Kitchen aspirin 81 MG tablet Take 81 mg by mouth daily.  . B Complex Vitamins (B-COMPLEX HIGH POTENCY PO) Take by mouth.  Marland Kitchen buPROPion (WELLBUTRIN XL) 150 MG 24 hr tablet Take 300 mg by mouth daily.   . Calcium Carbonate (CALTRATE 600 PO) Take by mouth.  . hydrochlorothiazide (HYDRODIURIL) 25 MG tablet Take 12.5 mg by mouth daily.  . lansoprazole (PREVACID) 30 MG capsule Take 30 mg by mouth daily at 12 noon.  Marland Kitchen losartan (COZAAR) 100 MG tablet Take 100 mg by mouth daily.  . mirabegron ER (MYRBETRIQ) 50 MG TB24 tablet Take 50 mg by mouth daily.  . nabumetone (RELAFEN) 750 MG tablet Take 1,500 mg by mouth daily.   . psyllium (REGULOID) 0.52 G capsule Take 0.52 g by mouth 2 (two) times daily.    PAST MEDICAL HISTORY:   Past Medical History  Diagnosis Date  . Hypertension   . GERD (gastroesophageal reflux disease)   . Diverticulosis   . Arthritis   . Aortic stenosis   . BPH (benign prostatic hyperplasia)   . Skin cancer, basal  cell     PAST SURGICAL HISTORY:   Past Surgical History  Procedure Laterality Date  . Left shoulder rtc tear    . Anal fissure repair    . Tonsillectomy    . Finger surgery    . Microdiscectomy lumbar      l5-s1  . Leg surgery      tendon repair right leg    SOCIAL HISTORY:   History   Social History  . Marital Status: Married    Spouse Name: N/A    Number of Children: N/A  . Years of Education: N/A   Occupational History  . Not on file.   Social History Main Topics  . Smoking status: Former Research scientist (life sciences)  . Smokeless tobacco: Not on file     Comment: Quit in the 80's  . Alcohol Use: Yes     Comment: 1-2 times week  . Drug Use: No  . Sexual Activity: Not on file   Other Topics Concern  . Not on file   Social History Narrative    FAMILY HISTORY:   Family Status  Relation Status Death Age  .  Mother Deceased     Dementia, heart disease  . Father Deceased     unknown    ROS:  Finger paresthesias with decreased coordination bilaterally.   A complete 10 system review of systems was obtained and was unremarkable apart from what is mentioned above.  PHYSICAL EXAMINATION:    VITALS:   Filed Vitals:   12/07/13 1232  BP: 124/82  Pulse: 108  Resp: 18  Height: 5\' 9"  (1.753 m)  Weight: 256 lb (116.121 kg)  SpO2: 93%    GEN:  The patient appears stated age and is in NAD. HEENT:  Normocephalic, atraumatic.  The mucous membranes are moist. The superficial temporal arteries are without ropiness or tenderness. CV:  RRR with 3/6 SEM Lungs:  CTAB Neck/HEME:  There are no carotid bruits bilaterally.  Neurological examination:  Orientation: The patient is alert and oriented x3. Fund of knowledge is appropriate.   Cranial nerves: There is good facial symmetry. Pupils are equal round and reactive to light bilaterally. Fundoscopic exam reveals clear margins bilaterally. Extraocular muscles are intact. The visual fields are full to confrontational testing. The speech is fluent and clear. Soft palate rises symmetrically and there is no tongue deviation. Hearing is intact to conversational tone. Sensation: Sensation is intact to light touch throughout. Motor: Strength is 5/5 in the bilateral upper and lower extremities, with the exception of finger abduction on the right and that is markedly decreased.   Shoulder shrug is equal and symmetric.  There is no pronator drift.  Movement examination: Tone: There is normal tone in the bilateral upper extremities.  The tone in the lower extremities is normal.  Abnormal movements: minimal tremor can be felt in the RUE Coordination:  There is no decremation with RAM's, with any form of RAM, including alternating supination and pronation of the forearm, hand opening and closing, finger taps, heel taps and toe taps. Gait and Station: The patient has  minimal difficulty arising out of a deep-seated chair without the use of the hands. The patient's stride length is decreased with a very wide based gait.  The patient has a positive pull test.      ASSESSMENT/PLAN:  1. Parkinsonism  -Differential now lies between vascular and NPH.  Talked to him about this.  Pt literature/handouts and education provided.  Will schedule for high volume LP with PT  present.  -If high volume LP helpful, then consider shunt.  If not, trial of levodopa. 2.  Bilateral CTS and L ulnar neuropathy  -cock up wrist splints at least nightly.  Pt not interested in injections or surgery.  Educated to stay off of the elbow in cars and when resting. 3.  Follow up after completion of LP

## 2013-12-07 NOTE — Patient Instructions (Signed)
1. Your lumbar puncture is scheduled on 12/23/2013 to arrive at [facility] at 9:00 am. Nothing to eat 4 hours prior and have a driver. If you need to reschedule for any reason please call [phone].

## 2013-12-20 ENCOUNTER — Ambulatory Visit (HOSPITAL_COMMUNITY): Payer: Medicare Other

## 2013-12-21 ENCOUNTER — Other Ambulatory Visit: Payer: Self-pay | Admitting: Neurology

## 2013-12-21 DIAGNOSIS — G912 (Idiopathic) normal pressure hydrocephalus: Secondary | ICD-10-CM

## 2013-12-22 ENCOUNTER — Other Ambulatory Visit: Payer: Self-pay | Admitting: Radiology

## 2013-12-23 ENCOUNTER — Ambulatory Visit (HOSPITAL_COMMUNITY)
Admission: RE | Admit: 2013-12-23 | Discharge: 2013-12-23 | Disposition: A | Discharge status: Home / Self Care | Payer: Medicare Other | Source: Ambulatory Visit | Attending: Neurology | Admitting: Neurology

## 2013-12-23 DIAGNOSIS — G912 (Idiopathic) normal pressure hydrocephalus: Secondary | ICD-10-CM | POA: Diagnosis present

## 2013-12-23 DIAGNOSIS — Z9181 History of falling: Secondary | ICD-10-CM | POA: Insufficient documentation

## 2013-12-23 NOTE — Discharge Instructions (Signed)
Lumbar Puncture, Care After °Refer to this sheet in the next few weeks. These instructions provide you with information on caring for yourself after your procedure. Your health care provider may also give you more specific instructions. Your treatment has been planned according to current medical practices, but problems sometimes occur. Call your health care provider if you have any problems or questions after your procedure. °WHAT TO EXPECT AFTER THE PROCEDURE °After your procedure, it is typical to have the following sensations: °· Mild discomfort or pain at the insertion site. °· Mild headache that is relieved with pain medicines. °HOME CARE INSTRUCTIONS °· Avoid lifting anything heavier than 10 lb (4.5 kg) for at least 12 hours after the procedure. °· Drink enough fluids to keep your urine clear or pale yellow. °SEEK MEDICAL CARE IF: °· You have fever or chills. °· You have nausea or vomiting. °· You have a headache that lasts for more than 2 days. °SEEK IMMEDIATE MEDICAL CARE IF: °· You have any numbness or tingling in your legs. °· You are unable to control your bowel or bladder. °· You have bleeding or swelling in your back at the insertion site. °· You are dizzy or faint. °Document Released: 01/12/2013 Document Reviewed: 01/12/2013 °ExitCare® Patient Information ©2015 [employer]. This information is not intended to replace advice given to you by your health care provider. Make sure you discuss any questions you have with your health care provider. ° °

## 2013-12-23 NOTE — Progress Notes (Signed)
12/23/2013  Pt's Pre and Post Puncture PT assessment completed.  Please see evaluation faxed to Dr. Doristine Devoid office (also sent to medical records to be scanned into E-chart/EPIC).  Pt with mild to moderate improvements in gait speed, stability, and balance.  He remains, even post test at a high fall risk and I continue to recommend cane use and if falls continue, OP PT for balance and falls therapy.  Pt and wife both subjectively report he looks more confident and he has less shuffle/choppy gait pattern (I agree).  Please call with questions.  Thanks,  Barbarann Ehlers. [place], [place], DPT [phone]

## 2013-12-23 NOTE — Procedures (Signed)
Technically successful fluoro guided LP yielding 30 cc of NML appearing CSF. No immediate post procedural complications.

## 2013-12-24 ENCOUNTER — Telehealth: Payer: Self-pay | Admitting: Neurology

## 2013-12-24 NOTE — Telephone Encounter (Signed)
Patient made aware we received lumbar puncture results and physical therapy assessment notes. He states he felt better after the lumbar puncture as well. He would like referral to discuss shunt. Referral made to [facility]. He requested Dr Ronnald Ramp due to MD performing a previous back surgery. Notes and referral faxed to [phone] with confirmation received. Physical therapy notes sent to scan.

## 2013-12-24 NOTE — Telephone Encounter (Signed)
-----   Message from Annamaria Helling, [place] sent at 12/21/2013  9:55 AM EST ----- Lumbar puncture

## 2013-12-27 ENCOUNTER — Telehealth: Payer: Self-pay | Admitting: Neurology

## 2013-12-27 NOTE — Telephone Encounter (Signed)
Referral sent to [facility] last week - they will schedule appt directly with patient.

## 2013-12-27 NOTE — Telephone Encounter (Signed)
Pt called this morning wanting to inform Jade and Dr.Tat to please make sure his referral appt to [facility] Is scheduled after this week.

## 2013-12-31 ENCOUNTER — Telehealth: Payer: Self-pay | Admitting: Neurology

## 2013-12-31 NOTE — Telephone Encounter (Signed)
Pt called f/u on the referral to [place]. Please call pt # [phone]

## 2013-12-31 NOTE — Telephone Encounter (Signed)
Patient informed that referral was sent  

## 2014-02-08 ENCOUNTER — Other Ambulatory Visit: Payer: Self-pay | Admitting: Neurological Surgery

## 2014-02-16 NOTE — Pre-Procedure Instructions (Signed)
Joseph [name]  02/16/2014   Your procedure is scheduled on:  Friday February 25, 2014 at 7:30 AM.  Report to [facility] Admitting at 5:30 AM.  Call this number if you have problems the morning of surgery: [phone]  For any other questions Monday-Friday from 8am-4pm call: [phone]  Remember:   Do not eat food or drink liquids after midnight.   Take these medicines the morning of surgery with A SIP OF WATER: Acetaminophen (Tylenol) if needed, Bupropion (Wellbutrin), Lansoprazole (Prevacid), Mirabegron (Myrbetriq)   Please stop taking any vitamins, relafen, advil, motrin, alleve, etc 7 days before your surgery Friday 02/18/14   Do not wear jewelry.  Do not wear lotions, powders, or cologne.   Men may shave face and neck.  Do not bring valuables to the hospital.  [facility] is not responsible for any belongings or valuables.               Contacts, dentures or bridgework may not be worn into surgery.  Leave suitcase in the car. After surgery it may be brought to your room.  For patients admitted to the hospital, discharge time is determined by your treatment team.               Patients discharged the day of surgery will not be allowed to drive home.  Name and phone number of your driver:   Special Instructions: Shower using CHG soap the night before and the morning of your surgery   Please read over the following fact sheets that you were given: Pain Booklet, Coughing and Deep Breathing and Surgical Site Infection Prevention

## 2014-02-17 ENCOUNTER — Encounter (HOSPITAL_COMMUNITY)
Admission: RE | Admit: 2014-02-17 | Discharge: 2014-02-17 | Disposition: A | Discharge status: Home / Self Care | Payer: Medicare Other | Source: Ambulatory Visit | Attending: Neurological Surgery | Admitting: Neurological Surgery

## 2014-02-17 ENCOUNTER — Encounter (HOSPITAL_COMMUNITY): Payer: Self-pay

## 2014-02-17 DIAGNOSIS — Z01812 Encounter for preprocedural laboratory examination: Secondary | ICD-10-CM | POA: Diagnosis not present

## 2014-02-17 DIAGNOSIS — Z01818 Encounter for other preprocedural examination: Secondary | ICD-10-CM | POA: Insufficient documentation

## 2014-02-17 DIAGNOSIS — Z01810 Encounter for preprocedural cardiovascular examination: Secondary | ICD-10-CM | POA: Insufficient documentation

## 2014-02-17 DIAGNOSIS — I10 Essential (primary) hypertension: Secondary | ICD-10-CM | POA: Diagnosis not present

## 2014-02-17 DIAGNOSIS — K219 Gastro-esophageal reflux disease without esophagitis: Secondary | ICD-10-CM | POA: Insufficient documentation

## 2014-02-17 DIAGNOSIS — Z87891 Personal history of nicotine dependence: Secondary | ICD-10-CM | POA: Insufficient documentation

## 2014-02-17 DIAGNOSIS — G919 Hydrocephalus, unspecified: Secondary | ICD-10-CM | POA: Diagnosis not present

## 2014-02-17 HISTORY — DX: Cardiac murmur, unspecified: R01.1

## 2014-02-17 HISTORY — DX: Family history of other specified conditions: Z84.89

## 2014-02-17 HISTORY — DX: Depression, unspecified: F32.A

## 2014-02-17 HISTORY — DX: Major depressive disorder, single episode, unspecified: F32.9

## 2014-02-17 HISTORY — DX: Unspecified urinary incontinence: R32

## 2014-02-17 LAB — BASIC METABOLIC PANEL
ANION GAP: 6 (ref 5–15)
BUN: 13 mg/dL (ref 6–23)
CALCIUM: 9.7 mg/dL (ref 8.4–10.5)
CO2: 29 mmol/L (ref 19–32)
Chloride: 105 mmol/L (ref 96–112)
Creatinine, Ser: 1.11 mg/dL (ref 0.50–1.35)
GFR calc Af Amer: 75 mL/min — ABNORMAL LOW (ref >90)
GFR calc non Af Amer: 65 mL/min — ABNORMAL LOW (ref >90)
GLUCOSE: 105 mg/dL — AB (ref 70–99)
Potassium: 4.3 mmol/L (ref 3.5–5.1)
Sodium: 140 mmol/L (ref 135–145)

## 2014-02-17 LAB — CBC WITH DIFFERENTIAL/PLATELET
Basophils Absolute: 0.1 10*3/uL (ref 0.0–0.1)
Basophils Relative: 1 % (ref 0–1)
EOS PCT: 5 % (ref 0–5)
Eosinophils Absolute: 0.4 10*3/uL (ref 0.0–0.7)
HCT: 50 % (ref 39.0–52.0)
HEMOGLOBIN: 17.2 g/dL — AB (ref 13.0–17.0)
LYMPHS ABS: 2 10*3/uL (ref 0.7–4.0)
Lymphocytes Relative: 25 % (ref 12–46)
MCH: 30.8 pg (ref 26.0–34.0)
MCHC: 34.4 g/dL (ref 30.0–36.0)
MCV: 89.6 fL (ref 78.0–100.0)
MONO ABS: 1 10*3/uL (ref 0.1–1.0)
Monocytes Relative: 13 % — ABNORMAL HIGH (ref 3–12)
NEUTROS ABS: 4.5 10*3/uL (ref 1.7–7.7)
Neutrophils Relative %: 56 % (ref 43–77)
Platelets: 230 10*3/uL (ref 150–400)
RBC: 5.58 MIL/uL (ref 4.22–5.81)
RDW: 13.2 % (ref 11.5–15.5)
WBC: 8 10*3/uL (ref 4.0–10.5)

## 2014-02-17 LAB — PROTIME-INR
INR: 1.08 (ref 0.00–1.49)
Prothrombin Time: 14.1 seconds (ref 11.6–15.2)

## 2014-02-17 NOTE — Progress Notes (Signed)
   02/17/14 0906  OBSTRUCTIVE SLEEP APNEA  Have you ever been diagnosed with sleep apnea through a sleep study? No  Do you snore loudly (loud enough to be heard through closed doors)?  0  Do you often feel tired, fatigued, or sleepy during the daytime? 1  Has anyone observed you stop breathing during your sleep? 1  Do you have, or are you being treated for high blood pressure? 1  BMI more than 35 kg/m2? 1  Age over 72 years old? 1  Neck circumference greater than 40 cm/16 inches? 1  Gender: 1  Obstructive Sleep Apnea Score 7  Score 4 or greater  Results sent to PCP   This patient has screened at risk for sleep apnea using the STOP Bang tool used during a pre-surgical visit. A score of 4 or greater is at risk for sleep apnea.

## 2014-02-17 NOTE — Progress Notes (Signed)
PCP is Gaynelle Arabian. Patient denied having a stress test, cardiac cath, or sleep study. Wife at chair side during PAT visit. Sleep apnea results sent to PCP.

## 2014-02-18 NOTE — Progress Notes (Addendum)
Anesthesia Chart Review:  Pt is 74 year old male scheduled for shunt replacement (dx normal pressure hydrocephalus) on 02/25/2014 with Dr. Ronnald Ramp.   PMH includes: HTN, aortic stenosis, heart murmur, GERD. Former smoker. BMI 37  Medications include: ASA, losartan, mirabegron  Preoperative labs reviewed.    Chest x-ray reviewed. No acute cardiopulmonary disease.   EKG: Sinus rhythm with frequent PVCs and PACs.   Echo 08/13/2013:  -Left ventricle: The cavity size was normal. Wall thickness was normal. Systolic function was normal. The estimated ejection fraction was in the range of 60% to 65%. Wall motion was normal; there were no regional wall motion abnormalities. Doppler parameters are consistent with abnormal left ventricular relaxation (grade 1 diastolic dysfunction). The E/e&' ratio is <8, suggesting normal LV filling pressure. - Aortic valve: Calcified leafelts with reduced excursion. Peak and mean gradients of 44 mmHG and 28 mmHG. Based on an LVOT diameter of 2.5 cm, the calculated AVA is 1.1 cm2, consistent with moderate aortic stenosis. Mean gradient (S): 24 mm Hg. Peak gradient (S): 39 mm Hg. - Mitral valve: Calcified annulus. - Left atrium: The atrium was normal in size. Impressions: - Compared to data from 06/2012, the peak and mean gradient has increased from 36 and 22 mmHG, to 44 and 28 mmHg, respectively. AVA is now closer to 1.1 cm2.`  There are notes in media tab from 2012 which indicate pt has seen Dr. Irish Lack in the past. I don't see any recent cardiac eval.   Discussed case with Dr. Conrad [place]. Pt will need cardiac clearance prior to surgery. Notified Lorriane Shire in Dr. Ronnald Ramp' office.   Willeen Cass, FNP-BC [facility]/Anesthesiology Phone: [phone] 02/18/2014 3:23 PM  Addendum: Patient was seen by cardiology by Cecilie Kicks, NP.  He had a nuclear stress test on 02/25/14 that showed: Overall Impression: Low risk stress nuclear study Mild  inferoapical/Septal ischemia although interferance from increased gut uptake may be confounding. Clinical correlation  Recommended. LV Wall Motion: Not performed because of ectopy. He was felt to be low risk based on Lee cardiac risk index (LEE criteria).  Cardiologist Dr. Irish Lack signed an noted of cardiac clearance on 03/01/14.  If no acute changes then I would anticipate that he could proceed as planned.  George Hugh [facility]/Anesthesiology Phone [phone] 03/02/2014 4:04 PM

## 2014-02-22 ENCOUNTER — Encounter: Payer: Self-pay | Admitting: Cardiology

## 2014-02-22 ENCOUNTER — Encounter: Payer: Self-pay | Admitting: Interventional Cardiology

## 2014-02-22 ENCOUNTER — Ambulatory Visit (INDEPENDENT_AMBULATORY_CARE_PROVIDER_SITE_OTHER): Payer: Medicare Other | Admitting: Cardiology

## 2014-02-22 VITALS — BP 118/78 | HR 96 | Ht 70.0 in | Wt 258.3 lb

## 2014-02-22 DIAGNOSIS — G214 Vascular parkinsonism: Secondary | ICD-10-CM

## 2014-02-22 DIAGNOSIS — I10 Essential (primary) hypertension: Secondary | ICD-10-CM

## 2014-02-22 DIAGNOSIS — Z01810 Encounter for preprocedural cardiovascular examination: Secondary | ICD-10-CM

## 2014-02-22 DIAGNOSIS — I38 Endocarditis, valve unspecified: Secondary | ICD-10-CM | POA: Insufficient documentation

## 2014-02-22 NOTE — Progress Notes (Signed)
02/22/2014   PCP: Simona Huh, MD   Chief Complaint  Patient presents with  . rov    patient is here for surgical clearance. having a shunt put in head dy Dr.David Jones.  last saw Dr. Beau Fanny 2 years ago    Primary Cardiologist:Dr. Irish Lack  HPI:  74 year old pt referred for cardiac eval for surgery planned for next week with Dr. Ronnald Ramp.  I do not have an office visit from him.  Pt tells me this will be general anesthesia.  Pt  With imbalance and Parkinsonism, after high volume LP done then referred to Dr. Ronnald Ramp.    Pt has no hx of chest pain or stress test.  He has moderate AS on Echo from 07/2013.  He had an EKG on 02/17/14 with SR with PACs and PVCs.  He is not very active due to balance issues.  He may walk up 2 flights of steps at home and with that no SOB or chest pain.  No awareness of tachycardia or palpitations.  He used to snore heavily but now very mild.  No tobacco for 28 years.  No lightheadedness no dizziness.  Allergies to ACE and BB. Also to amlodipine.  Echo: Left ventricle: The cavity size was normal. Wall thickness was normal. Systolic function was normal. The estimated ejection fraction was in the range of 60% to 65%. Wall motion was normal; there were no regional wall motion abnormalities. Doppler parameters are consistent with abnormal left ventricular relaxation (grade 1 diastolic dysfunction). The E/e&' ratio is <8, suggesting normal LV filling pressure. - Aortic valve: Calcified leafelts with reduced excursion. Peak and mean gradients of 44 mmHG and 28 mmHG. Based on an LVOT diameter of 2.5 cm, the calculated AVA is 1.1 cm2, consistent with moderate aortic stenosis. Mean gradient (S): 24 mm Hg. Peak gradient (S): 39 mm Hg. - Mitral valve: Calcified annulus. - Left atrium: The atrium was normal in size. Impressions: - Compared to data from 06/2012, the peak and mean gradient has increased from 36 and 22 mmHG, to 44 and  28 mmHg, respectively. AVA is now closer to 1.1 cm2.  No recent lipid panel.    BMET    Component Value Date/Time   NA 140 02/17/2014 0920   K 4.3 02/17/2014 0920   CL 105 02/17/2014 0920   CO2 29 02/17/2014 0920   GLUCOSE 105* 02/17/2014 0920   BUN 13 02/17/2014 0920   CREATININE 1.11 02/17/2014 0920   CALCIUM 9.7 02/17/2014 0920   GFRNONAA 65* 02/17/2014 0920   GFRAA 75* 02/17/2014 0920    CBC    Component Value Date/Time   WBC 8.0 02/17/2014 0920   RBC 5.58 02/17/2014 0920   HGB 17.2* 02/17/2014 0920   HCT 50.0 02/17/2014 0920   PLT 230 02/17/2014 0920   MCV 89.6 02/17/2014 0920   MCH 30.8 02/17/2014 0920   MCHC 34.4 02/17/2014 0920   RDW 13.2 02/17/2014 0920   LYMPHSABS 2.0 02/17/2014 0920   MONOABS 1.0 02/17/2014 0920   EOSABS 0.4 02/17/2014 0920   BASOSABS 0.1 02/17/2014 0920         Allergies  Allergen Reactions  . Ace Inhibitors Cough  . Atenolol Shortness Of Breath  . Beta Adrenergic Blockers Shortness Of Breath    Moderate reaction  . Erythromycin Diarrhea  . Norvasc [Amlodipine] Other (See Comments)    unknown  . Prilosec [Omeprazole] Rash    Current Outpatient Prescriptions  Medication Sig Dispense Refill  . aspirin EC 81 MG tablet Take 81 mg by mouth every morning.    . B Complex Vitamins (B-COMPLEX HIGH POTENCY PO) Take 1 tablet by mouth daily with supper.     Marland Kitchen buPROPion (WELLBUTRIN XL) 300 MG 24 hr tablet Take 300 mg by mouth every morning.     . Calcium Carb-Cholecalciferol (CALCIUM 1000 + D PO) Take 1 tablet by mouth 2 (two) times daily.    . lansoprazole (PREVACID) 30 MG capsule Take 30 mg by mouth 2 (two) times daily before a meal.     . losartan (COZAAR) 100 MG tablet Take 100 mg by mouth every morning.     . mirabegron ER (MYRBETRIQ) 50 MG TB24 tablet Take 50 mg by mouth every morning.     . nabumetone (RELAFEN) 750 MG tablet Take 1,500 mg by mouth every morning.     . psyllium (REGULOID) 0.52 G capsule Take 1.04 g by mouth daily  with supper.      No current facility-administered medications for this visit.    Past Medical History  Diagnosis Date  . Hypertension   . GERD (gastroesophageal reflux disease)   . Arthritis   . Aortic stenosis   . BPH (benign prostatic hyperplasia)   . Skin cancer, basal cell   . Family history of adverse reaction to anesthesia     Pts mother had a difficult time awakening after anesthesia  . Heart murmur   . Depression   . Urinary incontinence   . Diverticulosis     Past Surgical History  Procedure Laterality Date  . Left shoulder rtc tear    . Anal fissure repair    . Tonsillectomy    . Finger surgery    . Microdiscectomy lumbar      l5-s1  . Leg surgery      tendon repair right leg  . Elbow surgery Right   . Colonoscopy w/ polypectomy      BZJ:IRCVELF:YB colds or fevers, no weight changes Skin:no rashes or ulcers HEENT:no blurred vision, no congestion CV:see HPI PUL:see HPI GI:no diarrhea constipation or melena, no indigestion GU:no hematuria, no dysuria MS:no joint pain, no claudication Neuro:no syncope, no lightheadedness Endo:no diabetes, no thyroid disease  Wt Readings from Last 3 Encounters:  02/22/14 258 lb 4.8 oz (117.164 kg)  12/07/13 256 lb (116.121 kg)  11/18/13 256 lb (116.121 kg)    PHYSICAL EXAM BP 118/78 mmHg  Pulse 96  Ht 5\' 10"  (1.778 m)  Wt 258 lb 4.8 oz (117.164 kg)  BMI 37.06 kg/m2 General:Pleasant affect, NAD Skin:Warm and dry, brisk capillary refill HEENT:normocephalic, sclera clear, mucus membranes moist Neck:supple, no JVD, no bruits, no adenopathy  Heart:S1S2 RRR with 3/6 systolic aortic murmur, gallup, rub or click Lungs:clear without rales, rhonchi, or wheezes OFB:PZWC, non tender, + BS, do not palpate liver spleen or masses Ext:no lower ext edema, 2+ pedal pulses, 2+ radial pulses, + muscle wasting. Neuro:alert and oriented, MAE, follows commands, + facial symmetry EKG from the 28th reviewed but no old one to compare  to.  ASSESSMENT AND PLAN Pre-operative cardiovascular examination, valvular heart disease Pt with moderate AS.  No prior stress test, will do a lexiscan myoview to further evaluate risk as pt does not do much physical activity due to balance issues.  I discussed with Dr. Gwenlyn Found.  Once myoview complete will have Dr. Irish Lack review for surgical clearance.  Pt agreeable, plan to do this week.  Pt would not be  able to walk on treadmill.  Will obtain Dr. Ronnald Ramp office note.     HTN (hypertension) Controlled.   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

## 2014-02-22 NOTE — Assessment & Plan Note (Addendum)
Pt with moderate AS.  No prior stress test, will do a lexiscan myoview to further evaluate risk as pt does not do much physical activity due to balance issues.  I discussed with Dr. Gwenlyn Found.  Once myoview complete will have Dr. Irish Lack review for surgical clearance.  Pt agreeable, plan to do this week.  Pt would not be able to walk on treadmill.  Will obtain Dr. Ronnald Ramp office note.

## 2014-02-22 NOTE — Assessment & Plan Note (Signed)
Followed by Neuro. 

## 2014-02-22 NOTE — Patient Instructions (Signed)
Your physician has requested that you have a lexiscan myoview. This will be scheduled this week.  Your physician recommends that you schedule a follow-up appointment in: 3-4 months with Dr. Ala Dach.

## 2014-02-22 NOTE — Assessment & Plan Note (Signed)
Controlled.  

## 2014-02-23 ENCOUNTER — Telehealth (HOSPITAL_COMMUNITY): Payer: Self-pay

## 2014-02-23 NOTE — Telephone Encounter (Signed)
Encounter complete. 

## 2014-02-24 NOTE — Progress Notes (Addendum)
Spoke with Lorriane Shire at Dr Ronnald Ramp' office and she stated that pt is to have cardiac appointment today with Dr Acie Fredrickson and he was to let them know after he was there.  I reminded her that surgery is tomorrow at 0730.   Pt is scheduled for a stress test 02/25/14 with Dr Acie Fredrickson.  Spoke with Lorriane Shire and surgery date is changed to 2/11.

## 2014-02-25 ENCOUNTER — Ambulatory Visit (HOSPITAL_COMMUNITY)
Admission: RE | Admit: 2014-02-25 | Discharge: 2014-02-25 | Disposition: A | Discharge status: Home / Self Care | Payer: Medicare Other | Source: Ambulatory Visit | Attending: Cardiology | Admitting: Cardiology

## 2014-02-25 DIAGNOSIS — R9431 Abnormal electrocardiogram [ECG] [EKG]: Secondary | ICD-10-CM | POA: Insufficient documentation

## 2014-02-25 DIAGNOSIS — Z01810 Encounter for preprocedural cardiovascular examination: Secondary | ICD-10-CM | POA: Insufficient documentation

## 2014-02-25 DIAGNOSIS — E669 Obesity, unspecified: Secondary | ICD-10-CM | POA: Insufficient documentation

## 2014-02-25 DIAGNOSIS — I10 Essential (primary) hypertension: Secondary | ICD-10-CM | POA: Insufficient documentation

## 2014-02-25 DIAGNOSIS — Z87891 Personal history of nicotine dependence: Secondary | ICD-10-CM | POA: Insufficient documentation

## 2014-02-25 DIAGNOSIS — Z86718 Personal history of other venous thrombosis and embolism: Secondary | ICD-10-CM | POA: Insufficient documentation

## 2014-02-25 DIAGNOSIS — Z6837 Body mass index (BMI) 37.0-37.9, adult: Secondary | ICD-10-CM | POA: Insufficient documentation

## 2014-02-25 MED ORDER — TECHNETIUM TC 99M SESTAMIBI GENERIC - CARDIOLITE
10.4000 | Freq: Once | INTRAVENOUS | Status: AC | PRN
  Administered 2014-02-25: 10 via INTRAVENOUS

## 2014-02-25 MED ORDER — TECHNETIUM TC 99M SESTAMIBI GENERIC - CARDIOLITE
30.6000 | Freq: Once | INTRAVENOUS | Status: AC | PRN
  Administered 2014-02-25: 31 via INTRAVENOUS

## 2014-02-25 MED ORDER — REGADENOSON 0.4 MG/5ML IV SOLN
0.4000 mg | Freq: Once | INTRAVENOUS | Status: AC
  Administered 2014-02-25: 0.4 mg via INTRAVENOUS

## 2014-02-25 NOTE — Procedures (Addendum)
[facility] [address] [place] [address] [phone]  Cardiology Nuclear Med Study  Joseph [name] is a 73 y.o. male     MRN : [id number]     DOB: 10-28-1940  Procedure Date: 02/25/2014  Nuclear Med Background Indication for Stress Test:  Surgical Clearance and Abnormal EKG History:  Aortic Stenosis;Heart Murmu;DVT;No priro NUC MPI for comparison. Cardiac Risk Factors: History of Smoking, Hypertension and Obesity  Symptoms:  Pt states he has no symptoms at this time.   Nuclear Pre-Procedure Caffeine/Decaff Intake:  12:00am NPO After: 10am   IV Site: R Forearm  IV 0.9% NS with Angio Cath:  22g  Chest Size (in):  52" IV Started by: Rolene Course, RN  Height: 5\' 10"  (1.778 m)  Cup Size: n/a  BMI:  Body mass index is 37.02 kg/(m^2). Weight:  258 lb (117.028 kg)   Tech Comments:  n/a    Nuclear Med Study 1 or 2 day study: 1 day  Stress Test Type:  [place] Provider:  Dr. Irish Lack   Resting Radionuclide: Technetium 29m Sestamibi  Resting Radionuclide Dose: 10.4 mCi   Stress Radionuclide:  Technetium 43m Sestamibi  Stress Radionuclide Dose: 30.6 mCi           Stress Protocol Rest HR: 75 Stress HR: 108  Rest BP: 124/89 Stress BP: 137/96  Exercise Time (min): n/a METS: n/a          Dose of Adenosine (mg):  n/a Dose of Lexiscan: 0.4 mg  Dose of Atropine (mg): n/a Dose of Dobutamine: n/a mcg/kg/min (at max HR)  Stress Test Technologist: Leane Para, CCT Nuclear Technologist: Imagene Riches, CNMT   Rest Procedure:  Myocardial perfusion imaging was performed at rest 45 minutes following the intravenous administration of Technetium 7m Sestamibi. Stress Procedure:  The patient received IV Lexiscan 0.4 mg over 15-seconds.  Technetium 39m Sestamibi injected IV at 30-seconds.  There were no significant changes with Lexiscan.  Quantitative spect images were obtained after a 45 minute  delay.  Transient Ischemic Dilatation (Normal <1.22):  1.26  QGS EDV:  n/a ml QGS ESV:  n/a ml LV Ejection Fraction: Study not gated       Rest ECG: NSR - Normal EKG and Freq PVCs  Stress ECG: No significant change from baseline ECG  QPS Raw Data Images:  There is interference from nuclear activity from structures below the diaphragm. This does not affect the ability to read the study. Stress Images:  There is decreased uptake in the inferior wall. Rest Images:  Normal homogeneous uptake in all areas of the myocardium. Subtraction (SDS):  These findings are consistent with ischemia.  Impression Exercise Capacity:  Lexiscan with no exercise. BP Response:  Normal blood pressure response. Clinical Symptoms:  No significant symptoms noted. ECG Impression:  No significant ST segment change suggestive of ischemia. Comparison with Prior Nuclear Study: No images to compare  Overall Impression:  Low risk stress nuclear study Mild inferoapical/Septal  ischemia although interferance from increased gut uptake may be confounding. Clinical correlation recommended.  LV Wall Motion:  Not performed because of ectopy   Lorretta Harp, MD  02/25/2014 5:29 PM

## 2014-03-01 ENCOUNTER — Telehealth: Payer: Self-pay | Admitting: Cardiology

## 2014-03-01 NOTE — Telephone Encounter (Signed)
Benson Setting sent Mickel Baas a message, but I think all that can be done on this end is done.

## 2014-03-01 NOTE — Telephone Encounter (Signed)
Reviewed w/ pt that result findings OK.   Noted Joseph [name] sent correspondence back to requesting neurology physician.  Pt will f/u w/ neurology.  Routing to [place] for any additional correspondence needed.

## 2014-03-01 NOTE — Telephone Encounter (Signed)
Pt ould like his stress test results from 02-24-14 please.

## 2014-03-02 ENCOUNTER — Telehealth: Payer: Self-pay | Admitting: Interventional Cardiology

## 2014-03-02 MED ORDER — CEFAZOLIN SODIUM-DEXTROSE 2-3 GM-% IV SOLR
2.0000 g | INTRAVENOUS | Status: AC
  Administered 2014-03-03: 2 g via INTRAVENOUS
  Filled 2014-03-02: qty 50

## 2014-03-02 MED ORDER — DEXAMETHASONE SODIUM PHOSPHATE 10 MG/ML IJ SOLN
10.0000 mg | INTRAMUSCULAR | Status: AC
  Administered 2014-03-03: 10 mg via INTRAVENOUS
  Filled 2014-03-02: qty 1

## 2014-03-02 NOTE — Progress Notes (Signed)
PATIENT STATED THE OFFICE INSTRUCTED HIM TO ARRIVE AT 1330 PM.

## 2014-03-02 NOTE — Telephone Encounter (Signed)
New message     Request for surgical clearance:  1. What type of surgery is being performed? Shunt put in head  When is this surgery scheduled? 03-03-14 Are there any medications that need to be held prior to surgery and how long? no 2. Name of physician performing surgery?Dr Shanon Brow Jones---[facility]  3. What is your office phone and fax number? [phone] 4. Please call patient and Dr Ronnald Ramp with the stress test results

## 2014-03-02 NOTE — Telephone Encounter (Signed)
Spoke with pt and informed him that I faxed over the paperwork for his cardia clearance for surgery. Pt verbalized understanding.

## 2014-03-03 ENCOUNTER — Encounter (HOSPITAL_COMMUNITY)
Admission: RE | Disposition: A | Discharge status: Home / Self Care | Payer: Self-pay | Source: Ambulatory Visit | Attending: Neurological Surgery

## 2014-03-03 ENCOUNTER — Inpatient Hospital Stay (HOSPITAL_COMMUNITY): Payer: Medicare Other | Admitting: Vascular Surgery

## 2014-03-03 ENCOUNTER — Encounter (HOSPITAL_COMMUNITY): Payer: Self-pay | Admitting: *Deleted

## 2014-03-03 ENCOUNTER — Inpatient Hospital Stay (HOSPITAL_COMMUNITY)
Admission: RE | Admit: 2014-03-03 | Discharge: 2014-03-04 | DRG: 033 | Disposition: A | Discharge status: Home / Self Care | Payer: Medicare Other | Source: Ambulatory Visit | Attending: Neurological Surgery | Admitting: Neurological Surgery

## 2014-03-03 ENCOUNTER — Inpatient Hospital Stay (HOSPITAL_COMMUNITY): Payer: Medicare Other | Admitting: Anesthesiology

## 2014-03-03 DIAGNOSIS — Z79899 Other long term (current) drug therapy: Secondary | ICD-10-CM

## 2014-03-03 DIAGNOSIS — G912 (Idiopathic) normal pressure hydrocephalus: Secondary | ICD-10-CM | POA: Diagnosis present

## 2014-03-03 DIAGNOSIS — I10 Essential (primary) hypertension: Secondary | ICD-10-CM | POA: Diagnosis present

## 2014-03-03 DIAGNOSIS — R269 Unspecified abnormalities of gait and mobility: Secondary | ICD-10-CM | POA: Diagnosis present

## 2014-03-03 DIAGNOSIS — K219 Gastro-esophageal reflux disease without esophagitis: Secondary | ICD-10-CM | POA: Diagnosis present

## 2014-03-03 DIAGNOSIS — Z7982 Long term (current) use of aspirin: Secondary | ICD-10-CM

## 2014-03-03 DIAGNOSIS — Z982 Presence of cerebrospinal fluid drainage device: Secondary | ICD-10-CM

## 2014-03-03 DIAGNOSIS — M1990 Unspecified osteoarthritis, unspecified site: Secondary | ICD-10-CM | POA: Diagnosis present

## 2014-03-03 DIAGNOSIS — Z87891 Personal history of nicotine dependence: Secondary | ICD-10-CM | POA: Diagnosis not present

## 2014-03-03 DIAGNOSIS — Z85828 Personal history of other malignant neoplasm of skin: Secondary | ICD-10-CM | POA: Diagnosis not present

## 2014-03-03 DIAGNOSIS — N400 Enlarged prostate without lower urinary tract symptoms: Secondary | ICD-10-CM | POA: Diagnosis present

## 2014-03-03 DIAGNOSIS — Z6835 Body mass index (BMI) 35.0-35.9, adult: Secondary | ICD-10-CM | POA: Diagnosis not present

## 2014-03-03 DIAGNOSIS — F329 Major depressive disorder, single episode, unspecified: Secondary | ICD-10-CM | POA: Diagnosis present

## 2014-03-03 HISTORY — PX: VENTRICULOPERITONEAL SHUNT: SHX204

## 2014-03-03 LAB — MRSA PCR SCREENING: MRSA BY PCR: NEGATIVE

## 2014-03-03 SURGERY — SHUNT INSERTION VENTRICULAR-PERITONEAL
Anesthesia: General | Site: Head | Laterality: Right

## 2014-03-03 MED ORDER — FENTANYL CITRATE 0.05 MG/ML IJ SOLN
INTRAMUSCULAR | Status: AC
  Filled 2014-03-03: qty 5

## 2014-03-03 MED ORDER — BUPROPION HCL ER (XL) 300 MG PO TB24
300.0000 mg | ORAL_TABLET | Freq: Every morning | ORAL | Status: DC
  Administered 2014-03-04: 300 mg via ORAL
  Filled 2014-03-03: qty 1

## 2014-03-03 MED ORDER — ROCURONIUM BROMIDE 100 MG/10ML IV SOLN
INTRAVENOUS | Status: DC | PRN
  Administered 2014-03-03: 40 mg via INTRAVENOUS

## 2014-03-03 MED ORDER — NEOSTIGMINE METHYLSULFATE 10 MG/10ML IV SOLN
INTRAVENOUS | Status: DC | PRN
  Administered 2014-03-03: 3 mg via INTRAVENOUS

## 2014-03-03 MED ORDER — ROCURONIUM BROMIDE 50 MG/5ML IV SOLN
INTRAVENOUS | Status: AC
  Filled 2014-03-03: qty 1

## 2014-03-03 MED ORDER — FENTANYL CITRATE 0.05 MG/ML IJ SOLN
INTRAMUSCULAR | Status: DC | PRN
  Administered 2014-03-03: 50 ug via INTRAVENOUS
  Administered 2014-03-03: 150 ug via INTRAVENOUS
  Administered 2014-03-03: 50 ug via INTRAVENOUS

## 2014-03-03 MED ORDER — PHENYLEPHRINE HCL 10 MG/ML IJ SOLN
10.0000 mg | INTRAMUSCULAR | Status: DC | PRN
  Administered 2014-03-03: 10 ug/min via INTRAVENOUS

## 2014-03-03 MED ORDER — MORPHINE SULFATE 2 MG/ML IJ SOLN
1.0000 mg | INTRAMUSCULAR | Status: DC | PRN
  Administered 2014-03-04: 1 mg via INTRAVENOUS
  Filled 2014-03-03: qty 1

## 2014-03-03 MED ORDER — SODIUM CHLORIDE 0.9 % IR SOLN
Status: DC | PRN
  Administered 2014-03-03: 18:00:00

## 2014-03-03 MED ORDER — HEMOSTATIC AGENTS (NO CHARGE) OPTIME
TOPICAL | Status: DC | PRN
  Administered 2014-03-03: 1 via TOPICAL

## 2014-03-03 MED ORDER — PROMETHAZINE HCL 25 MG PO TABS: 12.5000 mg | ORAL_TABLET | ORAL | Status: DC | PRN

## 2014-03-03 MED ORDER — VECURONIUM BROMIDE 10 MG IV SOLR
INTRAVENOUS | Status: DC | PRN
  Administered 2014-03-03: 2 mg via INTRAVENOUS

## 2014-03-03 MED ORDER — THROMBIN 5000 UNITS EX SOLR
CUTANEOUS | Status: DC | PRN
  Administered 2014-03-03 (×2): 5000 [IU] via TOPICAL

## 2014-03-03 MED ORDER — POTASSIUM CHLORIDE IN NACL 20-0.9 MEQ/L-% IV SOLN
INTRAVENOUS | Status: DC
  Administered 2014-03-03: 22:00:00 via INTRAVENOUS
  Filled 2014-03-03 (×3): qty 1000

## 2014-03-03 MED ORDER — MIDAZOLAM HCL 5 MG/5ML IJ SOLN
INTRAMUSCULAR | Status: DC | PRN
  Administered 2014-03-03: 2 mg via INTRAVENOUS

## 2014-03-03 MED ORDER — OXYCODONE HCL 5 MG PO TABS: 5.0000 mg | ORAL_TABLET | Freq: Once | ORAL | Status: DC | PRN

## 2014-03-03 MED ORDER — OXYCODONE HCL 5 MG/5ML PO SOLN: 5.0000 mg | Freq: Once | ORAL | Status: DC | PRN

## 2014-03-03 MED ORDER — LACTATED RINGERS IV SOLN
INTRAVENOUS | Status: DC
  Administered 2014-03-03: 14:00:00 via INTRAVENOUS

## 2014-03-03 MED ORDER — ACETAMINOPHEN 650 MG RE SUPP: 650.0000 mg | RECTAL | Status: DC | PRN

## 2014-03-03 MED ORDER — GLYCOPYRROLATE 0.2 MG/ML IJ SOLN
INTRAMUSCULAR | Status: AC
  Filled 2014-03-03: qty 3

## 2014-03-03 MED ORDER — LIDOCAINE HCL (CARDIAC) 20 MG/ML IV SOLN
INTRAVENOUS | Status: DC | PRN
  Administered 2014-03-03: 100 mg via INTRAVENOUS

## 2014-03-03 MED ORDER — HYDROCODONE-ACETAMINOPHEN 5-325 MG PO TABS
1.0000 | ORAL_TABLET | ORAL | Status: DC | PRN
  Administered 2014-03-03 – 2014-03-04 (×2): 1 via ORAL
  Filled 2014-03-03 (×2): qty 1

## 2014-03-03 MED ORDER — PHENYLEPHRINE HCL 10 MG/ML IJ SOLN
INTRAMUSCULAR | Status: DC | PRN
  Administered 2014-03-03: 80 ug via INTRAVENOUS
  Administered 2014-03-03: 280 ug via INTRAVENOUS

## 2014-03-03 MED ORDER — ONDANSETRON HCL 4 MG/2ML IJ SOLN
INTRAMUSCULAR | Status: AC
  Filled 2014-03-03: qty 2

## 2014-03-03 MED ORDER — LACTATED RINGERS IV SOLN
INTRAVENOUS | Status: DC | PRN
  Administered 2014-03-03 (×2): via INTRAVENOUS

## 2014-03-03 MED ORDER — CETYLPYRIDINIUM CHLORIDE 0.05 % MT LIQD
7.0000 mL | Freq: Two times a day (BID) | OROMUCOSAL | Status: DC
  Administered 2014-03-03 – 2014-03-04 (×2): 7 mL via OROMUCOSAL

## 2014-03-03 MED ORDER — ACETAMINOPHEN 325 MG PO TABS: 650.0000 mg | ORAL_TABLET | ORAL | Status: DC | PRN

## 2014-03-03 MED ORDER — ONDANSETRON HCL 4 MG/2ML IJ SOLN
INTRAMUSCULAR | Status: DC | PRN
  Administered 2014-03-03: 4 mg via INTRAVENOUS

## 2014-03-03 MED ORDER — HYDROMORPHONE HCL 1 MG/ML IJ SOLN
INTRAMUSCULAR | Status: AC
  Filled 2014-03-03: qty 1

## 2014-03-03 MED ORDER — LIDOCAINE HCL (CARDIAC) 20 MG/ML IV SOLN
INTRAVENOUS | Status: AC
  Filled 2014-03-03: qty 5

## 2014-03-03 MED ORDER — VECURONIUM BROMIDE 10 MG IV SOLR
INTRAVENOUS | Status: AC
  Filled 2014-03-03: qty 10

## 2014-03-03 MED ORDER — ARTIFICIAL TEARS OP OINT
TOPICAL_OINTMENT | OPHTHALMIC | Status: AC
  Filled 2014-03-03: qty 3.5

## 2014-03-03 MED ORDER — GLYCOPYRROLATE 0.2 MG/ML IJ SOLN
INTRAMUSCULAR | Status: DC | PRN
  Administered 2014-03-03: 0.4 mg via INTRAVENOUS

## 2014-03-03 MED ORDER — ONDANSETRON HCL 4 MG/2ML IJ SOLN: 4.0000 mg | INTRAMUSCULAR | Status: DC | PRN

## 2014-03-03 MED ORDER — 0.9 % SODIUM CHLORIDE (POUR BTL) OPTIME
TOPICAL | Status: DC | PRN
  Administered 2014-03-03: 1000 mL

## 2014-03-03 MED ORDER — PROMETHAZINE HCL 25 MG/ML IJ SOLN
6.2500 mg | INTRAMUSCULAR | Status: DC | PRN
Start: 2014-03-03 — End: 2014-03-03

## 2014-03-03 MED ORDER — ONDANSETRON HCL 4 MG PO TABS: 4.0000 mg | ORAL_TABLET | ORAL | Status: DC | PRN

## 2014-03-03 MED ORDER — PROPOFOL 10 MG/ML IV BOLUS
INTRAVENOUS | Status: AC
  Filled 2014-03-03: qty 20

## 2014-03-03 MED ORDER — HYDROMORPHONE HCL 1 MG/ML IJ SOLN
0.2500 mg | INTRAMUSCULAR | Status: DC | PRN
  Administered 2014-03-03: 0.5 mg via INTRAVENOUS

## 2014-03-03 MED ORDER — BACITRACIN ZINC 500 UNIT/GM EX OINT
TOPICAL_OINTMENT | CUTANEOUS | Status: DC | PRN
  Administered 2014-03-03: 1 via TOPICAL

## 2014-03-03 MED ORDER — SUCCINYLCHOLINE CHLORIDE 20 MG/ML IJ SOLN
INTRAMUSCULAR | Status: AC
  Filled 2014-03-03: qty 1

## 2014-03-03 MED ORDER — PROPOFOL 10 MG/ML IV BOLUS
INTRAVENOUS | Status: DC | PRN
  Administered 2014-03-03: 130 mg via INTRAVENOUS

## 2014-03-03 MED ORDER — ARTIFICIAL TEARS OP OINT
TOPICAL_OINTMENT | OPHTHALMIC | Status: DC | PRN
  Administered 2014-03-03: 1 via OPHTHALMIC

## 2014-03-03 MED ORDER — LOSARTAN POTASSIUM 50 MG PO TABS: 100.0000 mg | ORAL_TABLET | Freq: Every morning | ORAL | Status: DC

## 2014-03-03 MED ORDER — LIDOCAINE-EPINEPHRINE 1 %-1:100000 IJ SOLN
INTRAMUSCULAR | Status: DC | PRN
  Administered 2014-03-03: 10 mL

## 2014-03-03 MED ORDER — LOSARTAN POTASSIUM 50 MG PO TABS
100.0000 mg | ORAL_TABLET | Freq: Every day | ORAL | Status: DC
  Administered 2014-03-04: 100 mg via ORAL
  Filled 2014-03-03: qty 2

## 2014-03-03 MED ORDER — CEFAZOLIN SODIUM-DEXTROSE 2-3 GM-% IV SOLR
2.0000 g | Freq: Three times a day (TID) | INTRAVENOUS | Status: AC
  Administered 2014-03-03 – 2014-03-04 (×2): 2 g via INTRAVENOUS
  Filled 2014-03-03 (×2): qty 50

## 2014-03-03 MED ORDER — MIDAZOLAM HCL 2 MG/2ML IJ SOLN
INTRAMUSCULAR | Status: AC
  Filled 2014-03-03: qty 2

## 2014-03-03 SURGICAL SUPPLY — 71 items
APL SKNCLS STERI-STRIP NONHPOA (GAUZE/BANDAGES/DRESSINGS) ×2
BAG DECANTER FOR FLEXI CONT (MISCELLANEOUS) ×4 IMPLANT
BENZOIN TINCTURE PRP APPL 2/3 (GAUZE/BANDAGES/DRESSINGS) ×5 IMPLANT
BLADE CLIPPER SURG NEURO (BLADE) ×1 IMPLANT
BUR ACORN 6.0 PRECISION (BURR) ×3 IMPLANT
BUR ACORN 6.0MM PRECISION (BURR) ×1
CANISTER SUCT 3000ML PPV (MISCELLANEOUS) ×4 IMPLANT
CLIP RANEY DISP (INSTRUMENTS) IMPLANT
CLOSURE WOUND 1/2 X4 (GAUZE/BANDAGES/DRESSINGS) ×1
CONT SPEC 4OZ CLIKSEAL STRL BL (MISCELLANEOUS) ×3 IMPLANT
DRAPE INCISE IOBAN 66X45 STRL (DRAPES) ×4 IMPLANT
DRAPE ORTHO SPLIT 77X108 STRL (DRAPES) ×8
DRAPE POUCH INSTRU U-SHP 10X18 (DRAPES) ×4 IMPLANT
DRAPE SURG 17X23 STRL (DRAPES) ×3 IMPLANT
DRAPE SURG ORHT 6 SPLT 77X108 (DRAPES) ×4 IMPLANT
DRSG OPSITE 4X5.5 SM (GAUZE/BANDAGES/DRESSINGS) ×2 IMPLANT
DRSG OPSITE POSTOP 4X6 (GAUZE/BANDAGES/DRESSINGS) ×3 IMPLANT
DRSG TELFA 3X8 NADH (GAUZE/BANDAGES/DRESSINGS) IMPLANT
ELECT REM PT RETURN 9FT ADLT (ELECTROSURGICAL) ×4
ELECTRODE REM PT RTRN 9FT ADLT (ELECTROSURGICAL) ×2 IMPLANT
GAUZE SPONGE 4X4 12PLY STRL (GAUZE/BANDAGES/DRESSINGS) ×3 IMPLANT
GAUZE SPONGE 4X4 16PLY XRAY LF (GAUZE/BANDAGES/DRESSINGS) ×1 IMPLANT
GLOVE BIO SURGEON STRL SZ8 (GLOVE) ×7 IMPLANT
GLOVE BIOGEL PI IND STRL 7.5 (GLOVE) ×1 IMPLANT
GLOVE BIOGEL PI IND STRL 8.5 (GLOVE) ×1 IMPLANT
GLOVE BIOGEL PI INDICATOR 7.5 (GLOVE) ×2
GLOVE BIOGEL PI INDICATOR 8.5 (GLOVE) ×2
GLOVE ECLIPSE 7.5 STRL STRAW (GLOVE) ×6 IMPLANT
GOWN STRL REUS W/ TWL LRG LVL3 (GOWN DISPOSABLE) ×1 IMPLANT
GOWN STRL REUS W/ TWL XL LVL3 (GOWN DISPOSABLE) ×2 IMPLANT
GOWN STRL REUS W/TWL 2XL LVL3 (GOWN DISPOSABLE) ×1 IMPLANT
GOWN STRL REUS W/TWL LRG LVL3 (GOWN DISPOSABLE) ×4
GOWN STRL REUS W/TWL XL LVL3 (GOWN DISPOSABLE) ×8
KIT BASIN OR (CUSTOM PROCEDURE TRAY) ×4 IMPLANT
KIT ROOM TURNOVER OR (KITS) ×4 IMPLANT
LIQUID BAND (GAUZE/BANDAGES/DRESSINGS) ×3 IMPLANT
NDL BLUNT 16X1.5 OR ONLY (NEEDLE) IMPLANT
NDL HYPO 25X1 1.5 SAFETY (NEEDLE) ×1 IMPLANT
NEEDLE BLUNT 16X1.5 OR ONLY (NEEDLE) IMPLANT
NEEDLE HYPO 25X1 1.5 SAFETY (NEEDLE) ×4 IMPLANT
NS IRRIG 1000ML POUR BTL (IV SOLUTION) ×4 IMPLANT
PACK LAMINECTOMY NEURO (CUSTOM PROCEDURE TRAY) ×4 IMPLANT
PAD ARMBOARD 7.5X6 YLW CONV (MISCELLANEOUS) ×12 IMPLANT
PAD DRESSING TELFA 3X8 NADH (GAUZE/BANDAGES/DRESSINGS) ×1 IMPLANT
PATTIES SURGICAL .5 X.5 (GAUZE/BANDAGES/DRESSINGS) IMPLANT
PATTIES SURGICAL .5 X3 (DISPOSABLE) IMPLANT
PATTIES SURGICAL .75X.75 (GAUZE/BANDAGES/DRESSINGS) IMPLANT
RUBBERBAND STERILE (MISCELLANEOUS) IMPLANT
SHEATH PERITONEAL INTRO 46 (MISCELLANEOUS) ×3 IMPLANT
SHEATH PERITONEAL INTRO 61 (MISCELLANEOUS) ×3 IMPLANT
SPONGE LAP 4X18 X RAY DECT (DISPOSABLE) IMPLANT
SPONGE SURGIFOAM ABS GEL SZ50 (HEMOSTASIS) ×4 IMPLANT
STAPLER VISISTAT 35W (STAPLE) ×4 IMPLANT
STRIP CLOSURE SKIN 1/2X4 (GAUZE/BANDAGES/DRESSINGS) ×3 IMPLANT
SUT ETHILON 3 0 FSL (SUTURE) ×3 IMPLANT
SUT SILK 0 TIES 10X30 (SUTURE) IMPLANT
SUT SILK 2 0 TIES 17X18 (SUTURE) ×4
SUT SILK 2-0 18XBRD TIE BLK (SUTURE) ×2 IMPLANT
SUT SILK 3 0 SH 30 (SUTURE) IMPLANT
SUT VIC AB 2-0 CP2 18 (SUTURE) ×8 IMPLANT
SUT VIC AB 3-0 SH 8-18 (SUTURE) ×7 IMPLANT
SYR 5ML LL (SYRINGE) ×3 IMPLANT
SYR CONTROL 10ML LL (SYRINGE) ×5 IMPLANT
SYR TB 1ML 25GX5/8 (SYRINGE) IMPLANT
TAPE CLOTH SURG 4X10 WHT LF (GAUZE/BANDAGES/DRESSINGS) ×3 IMPLANT
TOWEL OR 17X24 6PK STRL BLUE (TOWEL DISPOSABLE) ×4 IMPLANT
TOWEL OR 17X26 10 PK STRL BLUE (TOWEL DISPOSABLE) ×7 IMPLANT
TRAY FOLEY CATH 14FRSI W/METER (CATHETERS) IMPLANT
UNDERPAD 30X30 INCONTINENT (UNDERPADS AND DIAPERS) ×4 IMPLANT
VALVE RT ANGLE UNITIZE DIST (Valve) ×3 IMPLANT
WATER STERILE IRR 1000ML POUR (IV SOLUTION) ×4 IMPLANT

## 2014-03-03 NOTE — Progress Notes (Signed)
Called pt to arrive at 1400. Per Neuro holding, Dr. Ronnald Ramp running about an hour ahead. No answer, message left on answering machine. Called and informed Donnie in Neuro holding regarding inability to speak with pt.

## 2014-03-03 NOTE — Anesthesia Postprocedure Evaluation (Signed)
  Anesthesia Post-op Note  Patient: Joseph [name]  Procedure(s) Performed: Procedure(s): SHUNT INSERTION VENTRICULAR-PERITONEAL (Right)  Patient Location: PACU  Anesthesia Type:General  Level of Consciousness: awake and alert   Airway and Oxygen Therapy: Patient Spontanous Breathing  Post-op Pain: mild  Post-op Assessment: Post-op Vital signs reviewed, Patient's Cardiovascular Status Stable and Respiratory Function Stable  Post-op Vital Signs: Reviewed  Filed Vitals:   03/03/14 1915  BP:   Pulse: 91  Temp:   Resp: 16    Complications: No apparent anesthesia complications

## 2014-03-03 NOTE — Anesthesia Preprocedure Evaluation (Addendum)
Anesthesia Evaluation  Patient identified by MRN, date of birth, ID band Patient awake    Reviewed: Allergy & Precautions, NPO status , Patient's Chart, lab work & pertinent test results  Airway Mallampati: II  TM Distance: >3 FB Neck ROM: Full    Dental  (+) Teeth Intact, Dental Advisory Given   Pulmonary former smoker,  breath sounds clear to auscultation        Cardiovascular hypertension, Pt. on medications + Valvular Problems/Murmurs AS Rhythm:Regular Rate:Normal + Systolic murmurs 01/6008: EF 55%. Moderate AS  02/26/14- Low risk nuclear stress test.   Neuro/Psych Depression Normal pressure hydrocephalus    GI/Hepatic Neg liver ROS, GERD-  ,  Endo/Other  Morbid obesity  Renal/GU negative Renal ROS     Musculoskeletal  (+) Arthritis -,   Abdominal   Peds  Hematology negative hematology ROS (+)   Anesthesia Other Findings   Reproductive/Obstetrics                          Anesthesia Physical Anesthesia Plan  ASA: III  Anesthesia Plan: General   Post-op Pain Management:    Induction: Intravenous  Airway Management Planned: Oral ETT  Additional Equipment:   Intra-op Plan:   Post-operative Plan: Extubation in OR  Informed Consent: I have reviewed the patients History and Physical, chart, labs and discussed the procedure including the risks, benefits and alternatives for the proposed anesthesia with the patient or authorized representative who has indicated his/her understanding and acceptance.   Dental advisory given  Plan Discussed with: CRNA  Anesthesia Plan Comments:         Anesthesia Quick Evaluation

## 2014-03-03 NOTE — Progress Notes (Signed)
Pt arrived at this time. Pt reported that he was told his surgery was at 1530. Joseph [name], in Neuro holding called and informed.

## 2014-03-03 NOTE — Anesthesia Procedure Notes (Signed)
Procedure Name: Intubation Date/Time: 03/03/2014 4:22 PM Performed by: Ned Grace Pre-anesthesia Checklist: Patient identified, Timeout performed, Emergency Drugs available, Suction available and Patient being monitored Patient Re-evaluated:Patient Re-evaluated prior to inductionOxygen Delivery Method: Circle system utilized Preoxygenation: Pre-oxygenation with 100% oxygen Intubation Type: IV induction Ventilation: Mask ventilation without difficulty and Oral airway inserted - appropriate to patient size Laryngoscope Size: Mac and 4 Grade View: Grade II Tube type: Oral Tube size: 7.5 mm Number of attempts: 1 Airway Equipment and Method: Stylet and Oral airway Placement Confirmation: ETT inserted through vocal cords under direct vision,  breath sounds checked- equal and bilateral and positive ETCO2 Secured at: 23 cm Tube secured with: Tape Dental Injury: Teeth and Oropharynx as per pre-operative assessment

## 2014-03-03 NOTE — Transfer of Care (Signed)
Immediate Anesthesia Transfer of Care Note  Patient: Joseph [name]  Procedure(s) Performed: Procedure(s): SHUNT INSERTION VENTRICULAR-PERITONEAL (Right)  Patient Location: PACU  Anesthesia Type:General  Level of Consciousness: awake, alert , oriented and patient cooperative  Airway & Oxygen Therapy: Patient Spontanous Breathing and Patient connected to face mask oxygen  Post-op Assessment: Report given to RN, Post -op Vital signs reviewed and stable and Patient moving all extremities X 4  Post vital signs: Reviewed and stable  Complications: No apparent anesthesia complications

## 2014-03-03 NOTE — Op Note (Signed)
03/03/2014  6:24 PM  PATIENT:  Joseph [name]  74 y.o. male  PRE-OPERATIVE DIAGNOSIS:  Normal pressure hydrocephalus  POST-OPERATIVE DIAGNOSIS:  Same  PROCEDURE:  Right ventriculoperitoneal shunt placement with [place] programmable valve set at 140  SURGEON:  Sherley Bounds, MD  ASSISTANTS: Dr. Vertell Limber  ANESTHESIA:   General  EBL: 30 ml  Total I/O In: 1500 [I.V.:1500] Out: 30 [Blood:30]  BLOOD ADMINISTERED:none  DRAINS: None   SPECIMEN:  No Specimen  INDICATION FOR PROCEDURE: This patient presented with difficulty with ambulation as well as some urinary incontinence. He had a lumbar puncture which improved his gait stability. MRI showed ventriculomegaly. He was diagnosed with normal pressure hydrocephalus. We recommended ventriculoperitoneal shunting. Patient understood the risks, benefits, and alternatives and potential outcomes and wished to proceed.  PROCEDURE DETAILS: The patient was taken to the operating room and after induction of adequate general endotracheal anesthesia he was placed in supine position on operating room table. His right parietal Region was shaved and cleaned with alcohol. He was draped from the right axilla region down to the right upper quadrant. He was then cleaned with Betadine and prepped with DuraPrep and draped in usual sterile fashion. A curvilinear incision was made behind the right parietal occipital region. A linear incision was made over the rectus muscle and the right upper quadrant. The shunt passer was passed from the right upper quadrant incision to the cranial incision. We then passed the unitized distal catheter through the shunt passer to the right upper quadrant incision. A burr hole was placed in the right parietal region to expose underlying dura which was opened. The brain was coagulated and the shunt passer was passed to a depth of 9 mm with good flow of clear CSF under pressure. We cut it and attached it to the [place] reservoir. We used a  silk suture to attach the ventricular catheter to the [place] reservoir. We then had good flow through the distal catheter. We then opened the rectus fascia and went through the muscle and identified the posterior rectus fascia which was opened within a 5 the underlying peritoneum. The peritoneum was opened and we saw omentum with peristalsis. As then passed in the right upper quadrant. The wounds were then irrigated and the cranial incision was closed with 2-0 Vicryl in the galea and staples in the skin. The abdominal incision was closed with 2-0 Vicryl in the rectus fascia both posteriorly and anteriorly. Subcutaneous tissues closed with 2-0 Vicryl in the subcuticular tissue was closed with 3-0 Vicryl. The skin was closed with benzoin and Steri-Strips. Sterile dressings were applied, patient was awakened from general anesthesia and transported to the recovery room in stable condition. At the end of the procedure all sponge needle and instrument counts were correct.  PLAN OF CARE: Admit to inpatient   PATIENT DISPOSITION:  ICU   Delay start of Pharmacological VTE agent (>24hrs) due to surgical blood loss or risk of bleeding:  yes

## 2014-03-03 NOTE — H&P (Signed)
Subjective:   Patient is a 74 y.o. male admitted for NPH. The patient first presented to me with complaints of difficulty with gait. He has also had urinary incontinence and some difficulty with memory.. Onset of symptoms was 1 year ago. The symptoms have been slowly progressive. Symptoms are exacerbated by none, and are relieved by none.  Previous work up includes lumbar puncture which improved his gait for 2 or 3 days. Imaging showed ventriculomegaly.  Past Medical History  Diagnosis Date  . Hypertension   . GERD (gastroesophageal reflux disease)   . Arthritis   . Aortic stenosis   . BPH (benign prostatic hyperplasia)   . Skin cancer, basal cell   . Family history of adverse reaction to anesthesia     Pts mother had a difficult time awakening after anesthesia  . Heart murmur   . Depression   . Urinary incontinence   . Diverticulosis     Past Surgical History  Procedure Laterality Date  . Left shoulder rtc tear    . Anal fissure repair    . Tonsillectomy    . Finger surgery    . Microdiscectomy lumbar      l5-s1  . Leg surgery      tendon repair right leg  . Elbow surgery Right   . Colonoscopy w/ polypectomy      Allergies  Allergen Reactions  . Ace Inhibitors Cough  . Atenolol Shortness Of Breath  . Beta Adrenergic Blockers Shortness Of Breath    Moderate reaction  . Erythromycin Diarrhea  . Norvasc [Amlodipine] Other (See Comments)    unknown  . Prilosec [Omeprazole] Rash    History  Substance Use Topics  . Smoking status: Former Research scientist (life sciences)  . Smokeless tobacco: Never Used     Comment: Quit in the 80's  . Alcohol Use: 1.2 - 1.8 oz/week    2-3 Glasses of wine per week     Comment: occasional 1-2 times week    Family History  Problem Relation Age of Onset  . Heart disease Mother    Prior to Admission medications   Medication Sig Start Date End Date Taking? Authorizing Provider  aspirin EC 81 MG tablet Take 81 mg by mouth every morning.   Yes Historical Provider,  MD  B Complex Vitamins (B-COMPLEX HIGH POTENCY PO) Take 1 tablet by mouth daily with supper.    Yes Historical Provider, MD  buPROPion (WELLBUTRIN XL) 300 MG 24 hr tablet Take 300 mg by mouth every morning.    Yes Historical Provider, MD  Calcium Carb-Cholecalciferol (CALCIUM 1000 + D PO) Take 1 tablet by mouth 2 (two) times daily.   Yes Historical Provider, MD  lansoprazole (PREVACID) 30 MG capsule Take 30 mg by mouth 2 (two) times daily before a meal.    Yes Historical Provider, MD  losartan (COZAAR) 100 MG tablet Take 100 mg by mouth every morning.    Yes Historical Provider, MD  mirabegron ER (MYRBETRIQ) 50 MG TB24 tablet Take 50 mg by mouth every morning.    Yes Historical Provider, MD  nabumetone (RELAFEN) 750 MG tablet Take 1,500 mg by mouth every morning.    Yes Historical Provider, MD  psyllium (REGULOID) 0.52 G capsule Take 1.04 g by mouth daily with supper.    Yes Historical Provider, MD     Review of Systems  Positive ROS: Negative  All other systems have been reviewed and were otherwise negative with the exception of those mentioned in the HPI and as  above.  Objective: Vital signs in last 24 hours: Temp:  [98 F (36.7 C)] 98 F (36.7 C) (02/11 1402) Pulse Rate:  [76] 76 (02/11 1402) Resp:  [18] 18 (02/11 1402) BP: (125)/(84) 125/84 mmHg (02/11 1402) SpO2:  [98 %] 98 % (02/11 1402) Weight:  [258 lb (117.028 kg)] 258 lb (117.028 kg) (02/11 1402)  General Appearance: Alert, cooperative, no distress, appears stated age Head: Normocephalic, without obvious abnormality, atraumatic Eyes: PERRL, conjunctiva/corneas clear, EOM's intact      Neck: Supple, symmetrical, trachea midline, Back: Symmetric, no curvature, ROM normal, no CVA tenderness Lungs:  respirations unlabored Heart: Regular rate and rhythm Abdomen: Soft, non-tender Extremities: Extremities normal, atraumatic, no cyanosis or edema Pulses: 2+ and symmetric all extremities Skin: Skin color, texture, turgor  normal, no rashes or lesions  NEUROLOGIC:  Mental status: Alert and oriented x4, no aphasia, good attention span, fund of knowledge and memory  Motor Exam - grossly normal Sensory Exam - grossly normal Reflexes: 1+ Coordination - grossly normal Gait - antalgic Balance - poor Cranial Nerves: I: smell Not tested  II: visual acuity  OS: nl    OD: nl  II: visual fields Full to confrontation  II: pupils Equal, round, reactive to light  III,VII: ptosis None  III,IV,VI: extraocular muscles  Full ROM  V: mastication Normal  V: facial light touch sensation  Normal  V,VII: corneal reflex  Present  VII: facial muscle function - upper  Normal  VII: facial muscle function - lower Normal  VIII: hearing Not tested  IX: soft palate elevation  Normal  IX,X: gag reflex Present  XI: trapezius strength  5/5  XI: sternocleidomastoid strength 5/5  XI: neck flexion strength  5/5  XII: tongue strength  Normal    Data Review Lab Results  Component Value Date   WBC 8.0 02/17/2014   HGB 17.2* 02/17/2014   HCT 50.0 02/17/2014   MCV 89.6 02/17/2014   PLT 230 02/17/2014   Lab Results  Component Value Date   NA 140 02/17/2014   K 4.3 02/17/2014   CL 105 02/17/2014   CO2 29 02/17/2014   BUN 13 02/17/2014   CREATININE 1.11 02/17/2014   GLUCOSE 105* 02/17/2014   Lab Results  Component Value Date   INR 1.08 02/17/2014    Assessment:   Normal pressure hydrocephalus. Patient has failed conservative therapy. Planned surgery : Ventricular peritoneal shunt placement  Plan:   I explained the condition and procedure to the patient and answered any questions.  Patient wishes to proceed with procedure as planned. Understands risks/ benefits/ and expected or typical outcomes.  Arwyn Besaw S 03/03/2014 3:23 PM

## 2014-03-04 ENCOUNTER — Encounter (HOSPITAL_COMMUNITY): Payer: Self-pay | Admitting: Neurological Surgery

## 2014-03-04 MED ORDER — HYDROCODONE-ACETAMINOPHEN 5-325 MG PO TABS: 1.0000 | ORAL_TABLET | ORAL | Status: DC | PRN

## 2014-03-04 NOTE — Discharge Summary (Signed)
Physician Discharge Summary  Patient ID: Joseph [name] MRN: [id number] DOB/AGE: Dec 19, 1940 74 y.o.  Admit date: 03/03/2014 Discharge date: 03/04/2014  Admission Diagnoses: Hydrocephalus   Discharge Diagnoses: Same   Discharged Condition: stable  Hospital Course: The patient was admitted on 03/03/2014 and taken to the operating room where the patient underwent ventriculoperitoneal shunting. The patient tolerated the procedure well and was taken to the recovery room and then to the ICU in stable condition. The hospital course was routine. There were no complications. The wound remained clean dry and intact. Pt had appropriate head soreness. No complaints of  new N/T/W. The patient remained afebrile with stable vital signs, and tolerated a regular diet. The patient continued to increase activities and was ambulating around the unit without difficulty and minimal assistance, and pain was well controlled with oral pain medications.   Consults: None  Significant Diagnostic Studies:  Results for orders placed or performed during the hospital encounter of 03/03/14  MRSA PCR Screening  Result Value Ref Range   MRSA by PCR NEGATIVE NEGATIVE    Chest 2 View  02/17/2014   CLINICAL DATA:  Hydrocephalus.  EXAM: CHEST  2 VIEW  COMPARISON:  03/09/2010.  FINDINGS: Mediastinum and hilar structures are normal. The lungs are clear. Heart size normal. No pleural effusion or pneumothorax. No acute bony abnormality identified.  IMPRESSION: No acute cardiopulmonary disease.   Electronically Signed   By: Marcello Moores  Register   On: 02/17/2014 10:15    Antibiotics:  Anti-infectives    Start     Dose/Rate Route Frequency Ordered Stop   03/04/14 0030  ceFAZolin (ANCEF) IVPB 2 g/50 mL premix     2 g 100 mL/hr over 30 Minutes Intravenous Every 8 hours 03/03/14 2010 03/04/14 1629   03/03/14 1741  bacitracin 50,000 Units in sodium chloride irrigation 0.9 % 500 mL irrigation  Status:  Discontinued       As needed  03/03/14 1742 03/03/14 1828   03/03/14 0600  ceFAZolin (ANCEF) IVPB 2 g/50 mL premix     2 g 100 mL/hr over 30 Minutes Intravenous On call to O.R. 03/02/14 1400 03/03/14 1625      Discharge Exam: Blood pressure 101/67, pulse 94, temperature 98.1 F (36.7 C), temperature source Oral, resp. rate 15, height 5\' 10"  (1.778 m), weight 244 lb 14.9 oz (111.1 kg), SpO2 92 %. Neurologic: Grossly normal, walking well with wide-based gait, awake and alert and pleasant and conversant Incision clean dry and intact  Discharge Medications:     Medication List    TAKE these medications        aspirin EC 81 MG tablet  Take 81 mg by mouth every morning.     B-COMPLEX HIGH POTENCY PO  Take 1 tablet by mouth daily with supper.     buPROPion 300 MG 24 hr tablet  Commonly known as:  WELLBUTRIN XL  Take 300 mg by mouth every morning.     CALCIUM 1000 + D PO  Take 1 tablet by mouth 2 (two) times daily.     HYDROcodone-acetaminophen 5-325 MG per tablet  Commonly known as:  NORCO/VICODIN  Take 1 tablet by mouth every 4 (four) hours as needed for moderate pain.     lansoprazole 30 MG capsule  Commonly known as:  PREVACID  Take 30 mg by mouth 2 (two) times daily before a meal.     losartan 100 MG tablet  Commonly known as:  COZAAR  Take 100 mg by mouth every morning.  MYRBETRIQ 50 MG Tb24 tablet  Generic drug:  mirabegron ER  Take 50 mg by mouth every morning.     nabumetone 750 MG tablet  Commonly known as:  RELAFEN  Take 1,500 mg by mouth every morning.     psyllium 0.52 G capsule  Commonly known as:  REGULOID  Take 1.04 g by mouth daily with supper.        Disposition: Home   Final Dx: Ventriculoperitoneal shunt placement      Discharge Instructions    Call MD for:  difficulty breathing, headache or visual disturbances    Complete by:  As directed      Call MD for:  persistant nausea and vomiting    Complete by:  As directed      Call MD for:  redness, tenderness, or  signs of infection (pain, swelling, redness, odor or green/yellow discharge around incision site)    Complete by:  As directed      Call MD for:  severe uncontrolled pain    Complete by:  As directed      Call MD for:  temperature >100.4    Complete by:  As directed      Diet - low sodium heart healthy    Complete by:  As directed      Discharge instructions    Complete by:  As directed   No driving or heavy lifting, no strenuous activity     Increase activity slowly    Complete by:  As directed      Remove dressing in 48 hours    Complete by:  As directed            Follow-up Information    Follow up with Tonica Brasington S, MD. Schedule an appointment as soon as possible for a visit in 2 weeks.   Specialty:  Neurosurgery   Contact information:   [address] [place] [address] [phone]        Signed: [name] 03/04/2014, 7:33 AM

## 2014-03-04 NOTE — Progress Notes (Signed)
UR completed.  Rayley Gao, RN BSN [facility] CCM Trauma/Neuro ICU Case Manager [phone]  

## 2014-05-05 ENCOUNTER — Other Ambulatory Visit: Payer: Self-pay | Admitting: Neurological Surgery

## 2014-05-05 DIAGNOSIS — G912 (Idiopathic) normal pressure hydrocephalus: Secondary | ICD-10-CM

## 2014-05-31 ENCOUNTER — Ambulatory Visit
Admission: RE | Admit: 2014-05-31 | Discharge: 2014-05-31 | Disposition: A | Discharge status: Home / Self Care | Payer: Medicare Other | Source: Ambulatory Visit | Attending: Neurological Surgery | Admitting: Neurological Surgery

## 2014-05-31 DIAGNOSIS — G912 (Idiopathic) normal pressure hydrocephalus: Secondary | ICD-10-CM

## 2014-07-18 ENCOUNTER — Other Ambulatory Visit: Payer: Self-pay

## 2014-07-20 ENCOUNTER — Ambulatory Visit (INDEPENDENT_AMBULATORY_CARE_PROVIDER_SITE_OTHER): Payer: Medicare Other | Admitting: Interventional Cardiology

## 2014-07-20 ENCOUNTER — Encounter (HOSPITAL_BASED_OUTPATIENT_CLINIC_OR_DEPARTMENT_OTHER): Payer: Self-pay | Admitting: *Deleted

## 2014-07-20 ENCOUNTER — Encounter: Payer: Self-pay | Admitting: Interventional Cardiology

## 2014-07-20 VITALS — BP 106/74 | HR 105 | Ht 70.0 in | Wt 240.0 lb

## 2014-07-20 DIAGNOSIS — Z01810 Encounter for preprocedural cardiovascular examination: Principal | ICD-10-CM

## 2014-07-20 DIAGNOSIS — R9439 Abnormal result of other cardiovascular function study: Secondary | ICD-10-CM

## 2014-07-20 DIAGNOSIS — I38 Endocarditis, valve unspecified: Secondary | ICD-10-CM

## 2014-07-20 DIAGNOSIS — R931 Abnormal findings on diagnostic imaging of heart and coronary circulation: Secondary | ICD-10-CM

## 2014-07-20 DIAGNOSIS — I350 Nonrheumatic aortic (valve) stenosis: Secondary | ICD-10-CM | POA: Diagnosis not present

## 2014-07-20 NOTE — Patient Instructions (Signed)
Medication Instructions:  None  Labwork: None  Testing/Procedures: None  Follow-Up: Your physician wants you to follow-up in: 1 year with Dr. Varanasi.  You will receive a reminder letter in the mail two months in advance. If you don't receive a letter, please call our office to schedule the follow-up appointment.    Any Other Special Instructions Will Be Listed Below (If Applicable).   

## 2014-07-20 NOTE — Progress Notes (Signed)
Patient ID: Joseph [name], male   DOB: July 08, 1940, 74 y.o.   MRN: [id number]     Cardiology Office Note   Date:  07/20/2014   ID:  Joseph [name], DOB [birth year], MRN [id number]  PCP:  Simona Huh, MD    No chief complaint on file.   aortic stenosis  Wt Readings from Last 3 Encounters:  07/20/14 240 lb (108.863 kg)  03/03/14 244 lb 14.9 oz (111.1 kg)  02/25/14 258 lb (117.028 kg)       History of Present Illness: Joseph [name] is a 74 y.o. male  with a history of moderate aortic stenosis. He underwent stress testing in February 2016 prior to an intracranial shunt placement. He did well with anesthesia. His stress test was mildly abnormal but it was thought perhaps the abnormality was due to diaphragmatic attenuation. He had been doing well. He does not do any regular exercise but does walk some. He denied any chest discomfort or shortness of breath. Several days ago, he tripped while going down some stairs. He tore quadriceps tendon. He will likely need surgery. He did not pass out. There are no chest pain or shortness of breath associated with this. He has not had any lightheadedness or fluid retention symptoms.    Past Medical History  Diagnosis Date  . Hypertension   . GERD (gastroesophageal reflux disease)   . Arthritis   . Aortic stenosis   . BPH (benign prostatic hyperplasia)   . Skin cancer, basal cell   . Family history of adverse reaction to anesthesia     Pts mother had a difficult time awakening after anesthesia  . Heart murmur   . Depression   . Urinary incontinence   . Diverticulosis     Past Surgical History  Procedure Laterality Date  . Left shoulder rtc tear    . Anal fissure repair    . Tonsillectomy    . Finger surgery    . Microdiscectomy lumbar      l5-s1  . Leg surgery      tendon repair right leg  . Elbow surgery Right   . Colonoscopy w/ polypectomy    . Ventriculoperitoneal shunt Right 03/03/2014    Procedure: SHUNT INSERTION  VENTRICULAR-PERITONEAL;  Surgeon: Eustace Moore, MD;  Location: [facility];  Service: Neurosurgery;  Laterality: Right;     Current Outpatient Prescriptions  Medication Sig Dispense Refill  . aspirin EC 81 MG tablet Take 81 mg by mouth every morning.    . B Complex Vitamins (B-COMPLEX HIGH POTENCY PO) Take 1 tablet by mouth daily with supper.     Marland Kitchen buPROPion (WELLBUTRIN XL) 300 MG 24 hr tablet Take 300 mg by mouth every morning.     . Calcium Carb-Cholecalciferol (CALCIUM 1000 + D PO) Take 1 tablet by mouth 2 (two) times daily.    Marland Kitchen HYDROcodone-acetaminophen (NORCO/VICODIN) 5-325 MG per tablet Take 1 tablet by mouth every 4 (four) hours as needed for moderate pain. 30 tablet 0  . lansoprazole (PREVACID) 30 MG capsule Take 30 mg by mouth 2 (two) times daily before a meal.     . losartan (COZAAR) 100 MG tablet Take 100 mg by mouth every morning.     . mirabegron ER (MYRBETRIQ) 50 MG TB24 tablet Take 50 mg by mouth every morning.     . nabumetone (RELAFEN) 750 MG tablet Take 1,500 mg by mouth every morning.     . psyllium (REGULOID) 0.52 G capsule Take 1.04  g by mouth daily with supper.      No current facility-administered medications for this visit.    Allergies:   Ace inhibitors; Atenolol; Beta adrenergic blockers; Erythromycin; Norvasc; and Prilosec    Social History:  The patient  reports that he has quit smoking. He has never used smokeless tobacco. He reports that he drinks about 1.2 - 1.8 oz of alcohol per week. He reports that he does not use illicit drugs.   Family History:  The patient's family history includes Heart disease in his mother.    ROS:  Please see the history of present illness.   Otherwise, review of systems are positive for leg pain.   All other systems are reviewed and negative.    PHYSICAL EXAM: VS:  BP 106/74 mmHg  Pulse 105  Ht 5\' 10"  (1.778 m)  Wt 240 lb (108.863 kg)  BMI 34.44 kg/m2  SpO2 93% , BMI Body mass index is 34.44 kg/(m^2). GEN: Well  nourished, well developed, in no acute distress HEENT: normal Neck: no JVD, carotid bruits, or masses Cardiac: RRR; 2/6 systolic murmur, no rubs, or gallops,no edema  Respiratory:  clear to auscultation bilaterally, normal work of breathing GI: soft, nontender, nondistended, + BS MS: no deformity or atrophy, Left leg bruised Skin: warm and dry, no rash Neuro:  Strength and sensation are intact Psych: euthymic mood, full affect     Recent Labs: 02/17/2014: BUN 13; Creatinine, Ser 1.11; Hemoglobin 17.2*; Platelets 230; Potassium 4.3; Sodium 140   Lipid Panel No results found for: CHOL, TRIG, HDL, CHOLHDL, VLDL, LDLCALC, LDLDIRECT      ASSESSMENT AND PLAN:  1. Aortic stenosis:  Moderate. No symptoms of severe aortic stenosis 2. Abnormal stress test:   Mild inferior abnormality periods test still low risk. No symptoms. Tolerated anesthesia well before a few months ago. 3. Preoperative cardiovascular evaluation:  Given the above results, he does not require any further cardiac evaluation prior to orthopedic surgery. 4.  Long-term, he will benefit from weight loss and exercise. I tried to stress the importance of this to him. He stated several times that he is lazy. 5.  obesity: he states that he thinks his weight is closer to 260 than 240.   We discussed weight loss.   Current medicines are reviewed at length with the patient today.  The patient concerns regarding his medicines were addressed.  The following changes have been made:  No change  Labs/ tests ordered today include:  No orders of the defined types were placed in this encounter.    Recommend 150 minutes/week of aerobic exercise Low fat, low carb, high fiber diet recommended  Disposition:   FU in  1 year   Teresita Madura., MD  07/20/2014 9:14 AM    [facility] [address], [place], [place]  [address] Phone: [phone]; Fax: [phone]

## 2014-07-20 NOTE — Progress Notes (Signed)
Patient chart and cardiac notes reviewed by Dr Marcie Bal, [place] for [facility].

## 2014-07-21 ENCOUNTER — Ambulatory Visit (HOSPITAL_BASED_OUTPATIENT_CLINIC_OR_DEPARTMENT_OTHER): Payer: Medicare Other | Admitting: Certified Registered"

## 2014-07-21 ENCOUNTER — Encounter (HOSPITAL_BASED_OUTPATIENT_CLINIC_OR_DEPARTMENT_OTHER): Payer: Self-pay | Admitting: *Deleted

## 2014-07-21 ENCOUNTER — Ambulatory Visit (HOSPITAL_BASED_OUTPATIENT_CLINIC_OR_DEPARTMENT_OTHER)
Admission: RE | Admit: 2014-07-21 | Discharge: 2014-07-21 | Disposition: A | Discharge status: Home / Self Care | Payer: Medicare Other | Source: Ambulatory Visit | Attending: Orthopedic Surgery | Admitting: Orthopedic Surgery

## 2014-07-21 ENCOUNTER — Encounter (HOSPITAL_BASED_OUTPATIENT_CLINIC_OR_DEPARTMENT_OTHER)
Admission: RE | Disposition: A | Discharge status: Home / Self Care | Payer: Self-pay | Source: Ambulatory Visit | Attending: Orthopedic Surgery

## 2014-07-21 DIAGNOSIS — M1990 Unspecified osteoarthritis, unspecified site: Secondary | ICD-10-CM | POA: Diagnosis not present

## 2014-07-21 DIAGNOSIS — Z87891 Personal history of nicotine dependence: Secondary | ICD-10-CM | POA: Diagnosis not present

## 2014-07-21 DIAGNOSIS — Z982 Presence of cerebrospinal fluid drainage device: Secondary | ICD-10-CM | POA: Insufficient documentation

## 2014-07-21 DIAGNOSIS — K219 Gastro-esophageal reflux disease without esophagitis: Secondary | ICD-10-CM | POA: Insufficient documentation

## 2014-07-21 DIAGNOSIS — Y999 Unspecified external cause status: Secondary | ICD-10-CM | POA: Diagnosis not present

## 2014-07-21 DIAGNOSIS — S76112A Strain of left quadriceps muscle, fascia and tendon, initial encounter: Secondary | ICD-10-CM | POA: Diagnosis not present

## 2014-07-21 DIAGNOSIS — I10 Essential (primary) hypertension: Secondary | ICD-10-CM | POA: Diagnosis not present

## 2014-07-21 DIAGNOSIS — Z791 Long term (current) use of non-steroidal anti-inflammatories (NSAID): Secondary | ICD-10-CM | POA: Insufficient documentation

## 2014-07-21 DIAGNOSIS — W109XXA Fall (on) (from) unspecified stairs and steps, initial encounter: Secondary | ICD-10-CM | POA: Diagnosis not present

## 2014-07-21 DIAGNOSIS — R32 Unspecified urinary incontinence: Secondary | ICD-10-CM | POA: Diagnosis not present

## 2014-07-21 DIAGNOSIS — F329 Major depressive disorder, single episode, unspecified: Secondary | ICD-10-CM | POA: Insufficient documentation

## 2014-07-21 DIAGNOSIS — Z79891 Long term (current) use of opiate analgesic: Secondary | ICD-10-CM | POA: Diagnosis not present

## 2014-07-21 DIAGNOSIS — Z79899 Other long term (current) drug therapy: Secondary | ICD-10-CM | POA: Diagnosis not present

## 2014-07-21 DIAGNOSIS — Z7982 Long term (current) use of aspirin: Secondary | ICD-10-CM | POA: Diagnosis not present

## 2014-07-21 DIAGNOSIS — N400 Enlarged prostate without lower urinary tract symptoms: Secondary | ICD-10-CM | POA: Diagnosis not present

## 2014-07-21 DIAGNOSIS — Y939 Activity, unspecified: Secondary | ICD-10-CM | POA: Insufficient documentation

## 2014-07-21 DIAGNOSIS — Y929 Unspecified place or not applicable: Secondary | ICD-10-CM | POA: Diagnosis not present

## 2014-07-21 DIAGNOSIS — Z85828 Personal history of other malignant neoplasm of skin: Secondary | ICD-10-CM | POA: Insufficient documentation

## 2014-07-21 DIAGNOSIS — I350 Nonrheumatic aortic (valve) stenosis: Secondary | ICD-10-CM | POA: Diagnosis not present

## 2014-07-21 HISTORY — PX: QUADRICEPS TENDON REPAIR: SHX756

## 2014-07-21 HISTORY — DX: (Idiopathic) normal pressure hydrocephalus: G91.2

## 2014-07-21 HISTORY — DX: Strain of left quadriceps muscle, fascia and tendon, initial encounter: S76.112A

## 2014-07-21 LAB — POCT HEMOGLOBIN-HEMACUE: Hemoglobin: 14.8 g/dL (ref 13.0–17.0)

## 2014-07-21 SURGERY — REPAIR, TENDON, QUADRICEPS
Anesthesia: Regional | Site: Knee | Laterality: Left

## 2014-07-21 MED ORDER — PROPOFOL 500 MG/50ML IV EMUL
INTRAVENOUS | Status: AC
  Filled 2014-07-21: qty 50

## 2014-07-21 MED ORDER — ONDANSETRON HCL 4 MG PO TABS: 4.0000 mg | ORAL_TABLET | Freq: Three times a day (TID) | ORAL | Status: DC | PRN

## 2014-07-21 MED ORDER — CEFAZOLIN SODIUM-DEXTROSE 2-3 GM-% IV SOLR
INTRAVENOUS | Status: AC
  Filled 2014-07-21: qty 50

## 2014-07-21 MED ORDER — CEFAZOLIN SODIUM-DEXTROSE 2-3 GM-% IV SOLR
2.0000 g | INTRAVENOUS | Status: AC
  Administered 2014-07-21: 2 g via INTRAVENOUS

## 2014-07-21 MED ORDER — GLYCOPYRROLATE 0.2 MG/ML IJ SOLN: 0.2000 mg | Freq: Once | INTRAMUSCULAR | Status: DC | PRN

## 2014-07-21 MED ORDER — ONDANSETRON HCL 4 MG/2ML IJ SOLN
INTRAMUSCULAR | Status: DC | PRN
  Administered 2014-07-21: 4 mg via INTRAVENOUS

## 2014-07-21 MED ORDER — PROPOFOL 10 MG/ML IV BOLUS
INTRAVENOUS | Status: DC | PRN
  Administered 2014-07-21: 30 mg via INTRAVENOUS
  Administered 2014-07-21: 120 mg via INTRAVENOUS
  Administered 2014-07-21: 20 mg via INTRAVENOUS

## 2014-07-21 MED ORDER — HYDROCODONE-ACETAMINOPHEN 10-325 MG PO TABS: 1.0000 | ORAL_TABLET | Freq: Four times a day (QID) | ORAL | Status: DC | PRN

## 2014-07-21 MED ORDER — SCOPOLAMINE 1 MG/3DAYS TD PT72: 1.0000 | MEDICATED_PATCH | Freq: Once | TRANSDERMAL | Status: DC | PRN

## 2014-07-21 MED ORDER — MIDAZOLAM HCL 2 MG/2ML IJ SOLN
INTRAMUSCULAR | Status: AC
Start: 2014-07-21 — End: 2014-07-21
  Filled 2014-07-21: qty 2

## 2014-07-21 MED ORDER — CEFAZOLIN SODIUM-DEXTROSE 2-3 GM-% IV SOLR: 2.0000 g | INTRAVENOUS | Status: DC

## 2014-07-21 MED ORDER — MIDAZOLAM HCL 2 MG/2ML IJ SOLN
1.0000 mg | INTRAMUSCULAR | Status: DC | PRN
  Administered 2014-07-21: 1 mg via INTRAVENOUS

## 2014-07-21 MED ORDER — FENTANYL CITRATE (PF) 100 MCG/2ML IJ SOLN
50.0000 ug | INTRAMUSCULAR | Status: AC | PRN
  Administered 2014-07-21 (×4): 50 ug via INTRAVENOUS

## 2014-07-21 MED ORDER — SENNA-DOCUSATE SODIUM 8.6-50 MG PO TABS: 2.0000 | ORAL_TABLET | Freq: Every day | ORAL | Status: DC

## 2014-07-21 MED ORDER — DEXAMETHASONE SODIUM PHOSPHATE 10 MG/ML IJ SOLN
INTRAMUSCULAR | Status: DC | PRN
  Administered 2014-07-21: 10 mg via INTRAVENOUS

## 2014-07-21 MED ORDER — BACLOFEN 10 MG PO TABS: 10.0000 mg | ORAL_TABLET | Freq: Three times a day (TID) | ORAL | Status: DC

## 2014-07-21 MED ORDER — MIDAZOLAM HCL 2 MG/2ML IJ SOLN
INTRAMUSCULAR | Status: AC
  Filled 2014-07-21: qty 2

## 2014-07-21 MED ORDER — BUPIVACAINE HCL (PF) 0.5 % IJ SOLN
INTRAMUSCULAR | Status: DC | PRN
  Administered 2014-07-21: 10 mL

## 2014-07-21 MED ORDER — LIDOCAINE HCL (CARDIAC) 20 MG/ML IV SOLN
INTRAVENOUS | Status: DC | PRN
Start: 2014-07-21 — End: 2014-07-21
  Administered 2014-07-21: 60 mg via INTRAVENOUS

## 2014-07-21 MED ORDER — LACTATED RINGERS IV SOLN
INTRAVENOUS | Status: DC
  Administered 2014-07-21: 13:00:00 via INTRAVENOUS
  Administered 2014-07-21: 10 mL/h via INTRAVENOUS
  Administered 2014-07-21: 12:00:00 via INTRAVENOUS

## 2014-07-21 MED ORDER — FENTANYL CITRATE (PF) 100 MCG/2ML IJ SOLN
INTRAMUSCULAR | Status: AC
  Filled 2014-07-21: qty 2

## 2014-07-21 MED ORDER — FENTANYL CITRATE (PF) 100 MCG/2ML IJ SOLN
INTRAMUSCULAR | Status: AC
  Filled 2014-07-21: qty 6

## 2014-07-21 SURGICAL SUPPLY — 74 items
BAG DECANTER FOR FLEXI CONT (MISCELLANEOUS) IMPLANT
BANDAGE ELASTIC 6 VELCRO ST LF (GAUZE/BANDAGES/DRESSINGS) ×5 IMPLANT
BANDAGE ESMARK 6X9 LF (GAUZE/BANDAGES/DRESSINGS) ×1 IMPLANT
BIT DRILL 5/64X5 DISP (BIT) ×2 IMPLANT
BLADE SURG 15 STRL LF DISP TIS (BLADE) ×2 IMPLANT
BLADE SURG 15 STRL SS (BLADE) ×6
BNDG CMPR 9X6 STRL LF SNTH (GAUZE/BANDAGES/DRESSINGS) ×1
BNDG ESMARK 6X9 LF (GAUZE/BANDAGES/DRESSINGS) ×3
CANISTER SUCT 1200ML W/VALVE (MISCELLANEOUS) ×2 IMPLANT
CLOSURE STERI-STRIP 1/2X4 (GAUZE/BANDAGES/DRESSINGS) ×1
CLSR STERI-STRIP ANTIMIC 1/2X4 (GAUZE/BANDAGES/DRESSINGS) ×2 IMPLANT
DECANTER SPIKE VIAL GLASS SM (MISCELLANEOUS) IMPLANT
DRAPE EXTREMITY T 121X128X90 (DRAPE) ×3 IMPLANT
DRAPE U-SHAPE 47X51 STRL (DRAPES) ×3 IMPLANT
DURAPREP 26ML APPLICATOR (WOUND CARE) ×3 IMPLANT
ELECT REM PT RETURN 9FT ADLT (ELECTROSURGICAL) ×3
ELECTRODE REM PT RTRN 9FT ADLT (ELECTROSURGICAL) ×1 IMPLANT
GAUZE SPONGE 4X4 12PLY STRL (GAUZE/BANDAGES/DRESSINGS) ×3 IMPLANT
GAUZE XEROFORM 1X8 LF (GAUZE/BANDAGES/DRESSINGS) IMPLANT
GLOVE BIO SURGEON STRL SZ 6.5 (GLOVE) ×1 IMPLANT
GLOVE BIO SURGEONS STRL SZ 6.5 (GLOVE) ×1
GLOVE BIOGEL PI IND STRL 7.0 (GLOVE) IMPLANT
GLOVE BIOGEL PI IND STRL 8 (GLOVE) ×2 IMPLANT
GLOVE BIOGEL PI INDICATOR 7.0 (GLOVE) ×6
GLOVE BIOGEL PI INDICATOR 8 (GLOVE) ×6
GLOVE ECLIPSE 6.5 STRL STRAW (GLOVE) ×2 IMPLANT
GLOVE ORTHO TXT STRL SZ7.5 (GLOVE) ×3 IMPLANT
GLOVE SURG ORTHO 8.0 STRL STRW (GLOVE) ×1 IMPLANT
GOWN STRL REUS W/ TWL LRG LVL3 (GOWN DISPOSABLE) ×1 IMPLANT
GOWN STRL REUS W/ TWL XL LVL3 (GOWN DISPOSABLE) ×2 IMPLANT
GOWN STRL REUS W/TWL LRG LVL3 (GOWN DISPOSABLE) ×3
GOWN STRL REUS W/TWL XL LVL3 (GOWN DISPOSABLE) ×9
IMMOBILIZER KNEE 24 THIGH 36 (MISCELLANEOUS) ×1 IMPLANT
IMMOBILIZER KNEE 24 UNIV (MISCELLANEOUS)
NDL KEITH SZ10 STRAIGHT (NEEDLE) IMPLANT
NDL MAYO TROCAR (NEEDLE) IMPLANT
NDL SUT 6 .5 CRC .975X.05 MAYO (NEEDLE) IMPLANT
NEEDLE KEITH SZ10 STRAIGHT (NEEDLE) ×3 IMPLANT
NEEDLE MAYO TAPER (NEEDLE)
NEEDLE MAYO TROCAR (NEEDLE) ×3 IMPLANT
NS IRRIG 1000ML POUR BTL (IV SOLUTION) ×3 IMPLANT
PACK ARTHROSCOPY DSU (CUSTOM PROCEDURE TRAY) ×3 IMPLANT
PACK BASIN DAY SURGERY FS (CUSTOM PROCEDURE TRAY) ×3 IMPLANT
PADDING CAST ABS 4INX4YD NS (CAST SUPPLIES) ×6
PADDING CAST ABS COTTON 4X4 ST (CAST SUPPLIES) IMPLANT
PADDING CAST COTTON 6X4 STRL (CAST SUPPLIES) ×3 IMPLANT
PASSER SUT SWANSON 36MM LOOP (INSTRUMENTS) IMPLANT
PENCIL BUTTON HOLSTER BLD 10FT (ELECTRODE) ×3 IMPLANT
SHEET MEDIUM DRAPE 40X70 STRL (DRAPES) ×3 IMPLANT
SLEEVE SCD COMPRESS KNEE MED (MISCELLANEOUS) ×3 IMPLANT
SPLINT FAST PLASTER 5X30 (CAST SUPPLIES) ×60
SPLINT PLASTER CAST FAST 5X30 (CAST SUPPLIES) IMPLANT
SPONGE LAP 18X18 X RAY DECT (DISPOSABLE) ×2 IMPLANT
SPONGE LAP 4X18 X RAY DECT (DISPOSABLE) ×3 IMPLANT
STAPLER VISISTAT (STAPLE) IMPLANT
STAPLER VISISTAT 35W (STAPLE) IMPLANT
SUCTION FRAZIER TIP 10 FR DISP (SUCTIONS) IMPLANT
SUT FIBERWIRE #2 38 T-5 BLUE (SUTURE) ×6
SUT MNCRL AB 4-0 PS2 18 (SUTURE) ×1 IMPLANT
SUT VIC AB 0 CT1 18XCR BRD 8 (SUTURE) IMPLANT
SUT VIC AB 0 CT1 27 (SUTURE) ×9
SUT VIC AB 0 CT1 27XBRD ANBCTR (SUTURE) IMPLANT
SUT VIC AB 0 CT1 8-18 (SUTURE)
SUT VIC AB 0 SH 27 (SUTURE) IMPLANT
SUT VIC AB 1 CT1 27 (SUTURE)
SUT VIC AB 1 CT1 27XBRD ANBCTR (SUTURE) ×1 IMPLANT
SUT VIC AB 2-0 SH 27 (SUTURE) ×3
SUT VIC AB 2-0 SH 27XBRD (SUTURE) ×1 IMPLANT
SUT VICRYL 3-0 CR8 SH (SUTURE) ×3 IMPLANT
SUTURE FIBERWR #2 38 T-5 BLUE (SUTURE) IMPLANT
SYR BULB 3OZ (MISCELLANEOUS) ×3 IMPLANT
TOWEL OR 17X24 6PK STRL BLUE (TOWEL DISPOSABLE) ×5 IMPLANT
UNDERPAD 30X30 (UNDERPADS AND DIAPERS) ×1 IMPLANT
YANKAUER SUCT BULB TIP NO VENT (SUCTIONS) ×2 IMPLANT

## 2014-07-21 NOTE — Anesthesia Procedure Notes (Signed)
Procedure Name: LMA Insertion Date/Time: 07/21/2014 12:05 PM Performed by: Baxter Flattery Pre-anesthesia Checklist: Patient identified, Emergency Drugs available, Suction available and Patient being monitored Patient Re-evaluated:Patient Re-evaluated prior to inductionOxygen Delivery Method: Circle System Utilized Preoxygenation: Pre-oxygenation with 100% oxygen Intubation Type: IV induction Ventilation: Mask ventilation without difficulty LMA: LMA inserted LMA Size: 5.0 Number of attempts: 1 Airway Equipment and Method: Bite block Placement Confirmation: positive ETCO2 and breath sounds checked- equal and bilateral Tube secured with: Tape Dental Injury: Teeth and Oropharynx as per pre-operative assessment

## 2014-07-21 NOTE — Op Note (Signed)
07/21/2014  1:09 PM  PATIENT:  Joseph [name]    PRE-OPERATIVE DIAGNOSIS:  LEFT QUADRICEPS TENDON RUPTURE  POST-OPERATIVE DIAGNOSIS:  Same  PROCEDURE:  REPAIR LEFT QUADRICEPS TENDON  SURGEON:  Johnny Bridge, MD  PHYSICIAN ASSISTANT: Joya Gaskins, OPA-C, present and scrubbed throughout the case, critical for completion in a timely fashion, and for retraction, instrumentation, and closure.  ANESTHESIA:   General  PREOPERATIVE INDICATIONS:  [name] is a  74 y.o. male was had a previous right quadriceps tendon rupture, who fell down the stairs and tore his left quadriceps tendon.  The risks benefits and alternatives were discussed with the patient preoperatively including but not limited to the risks of infection, bleeding, nerve injury, cardiopulmonary complications, the need for revision surgery, among others, and the patient was willing to proceed. We also discussed the risks for extensor lag, recurrent rupture, incomplete relief of symptoms, weakness, among others.  OPERATIVE IMPLANTS: FiberWire 2 taken through drill holes in the patella with 0 Vicryl for the retinaculum on medial and lateral sides.  OPERATIVE FINDINGS: 100% quadriceps tendon rupture with substantial soft tissue destruction.  OPERATIVE PROCEDURE: The patient is brought to the operating room and placed in supine position. General anesthesia was noticed her. IV antibiotics were given. The left lower extremity was prepped and draped in usual sterile fashion. Time out performed. The leg was elevated and exsanguinated and a tourniquet was inflated. Anterior midline approach was carried out, and medial and lateral flaps elevated. There was substantial soft tissue destruction. The bed of the patella was prepared with a rongeur, and the tendon edges freshened, and I used a total of 2 #2 FiberWire using a Krakw-type suture going up and down the tendon twice, and then passed through a total of 3 drill holes in the  patella, with 2 of the suture limbs going centrally and one suture going through a medial tunnel and a lateral tunnel. I then used a free needle to minimize the soft tissue bridge between the sutures distally, and then secured the quadriceps tendon down to bone. Excellent repair of the tissue was achieved. I irrigated the wounds copiously and repaired the retinacular tissue with 0 Vicryl, along with 0 Vicryl for the anterior prepatellar tissue as well.  The subcutaneous tissue was closed with Vicryl followed by routine closure for the skin with Steri-Strips and sterile gauze and a long-leg splint with plaster.  He was awakened and returned to the PACU in stable and satisfactory condition. There were no complications and he tolerated the procedure well.

## 2014-07-21 NOTE — Discharge Instructions (Signed)
Diet: As you were doing prior to hospitalization   Shower:  May shower but keep the wounds dry, use an occlusive plastic wrap, NO SOAKING IN TUB.  May need to do "birdbath" given splint.  Dressing:  Keep splint dry.  Activity:  Increase activity slowly as tolerated, but follow the weight bearing instructions below.  No lifting or driving for 6 weeks.  Weight Bearing:   As tolerated with the splint on and using a walker or crutches..    To prevent constipation: you may use a stool softener such as -  Colace (over the counter) 100 mg by mouth twice a day  Drink plenty of fluids (prune juice may be helpful) and high fiber foods Miralax (over the counter) for constipation as needed.    Itching:  If you experience itching with your medications, try taking only a single pain pill, or even half a pain pill at a time.  You may take up to 10 pain pills per day, and you can also use benadryl over the counter for itching or also to help with sleep.   Precautions:  If you experience chest pain or shortness of breath - call 911 immediately for transfer to the hospital emergency department!!  If you develop a fever greater that 101 F, purulent drainage from wound, increased redness or drainage from wound, or calf pain -- Call the office at [phone]                                                Follow- Up Appointment:  Please call for an appointment to be seen in 2 weeks [place] - [phone]  Regional Anesthesia Blocks  1. Numbness or the inability to move the "blocked" extremity may last from 3-48 hours after placement. The length of time depends on the medication injected and your individual response to the medication. If the numbness is not going away after 48 hours, call your surgeon.  2. The extremity that is blocked will need to be protected until the numbness is gone and the  Strength has returned. Because you cannot feel it, you will need to take extra care to avoid injury. Because it  may be weak, you may have difficulty moving it or using it. You may not know what position it is in without looking at it while the block is in effect.  3. For blocks in the legs and feet, returning to weight bearing and walking needs to be done carefully. You will need to wait until the numbness is entirely gone and the strength has returned. You should be able to move your leg and foot normally before you try and bear weight or walk. You will need someone to be with you when you first try to ensure you do not fall and possibly risk injury.  4. Bruising and tenderness at the needle site are common side effects and will resolve in a few days.  5. Persistent numbness or new problems with movement should be communicated to the surgeon or the [place] [phone] [place] [phone]).  Post Anesthesia Home Care Instructions  Activity: Get plenty of rest for the remainder of the day. A responsible adult should stay with you for 24 hours following the procedure.  For the next 24 hours, DO NOT: -Drive a car -Paediatric nurse -Drink alcoholic beverages -Take  any medication unless instructed by your physician -Make any legal decisions or sign important papers.  Meals: Start with liquid foods such as gelatin or soup. Progress to regular foods as tolerated. Avoid greasy, spicy, heavy foods. If nausea and/or vomiting occur, drink only clear liquids until the nausea and/or vomiting subsides. Call your physician if vomiting continues.  Special Instructions/Symptoms: Your throat may feel dry or sore from the anesthesia or the breathing tube placed in your throat during surgery. If this causes discomfort, gargle with warm salt water. The discomfort should disappear within 24 hours.  If you had a scopolamine patch placed behind your ear for the management of post- operative nausea and/or vomiting:  1. The medication in the patch is effective for 72 hours, after which  it should be removed.  Wrap patch in a tissue and discard in the trash. Wash hands thoroughly with soap and water. 2. You may remove the patch earlier than 72 hours if you experience unpleasant side effects which may include dry mouth, dizziness or visual disturbances. 3. Avoid touching the patch. Wash your hands with soap and water after contact with the patch.

## 2014-07-21 NOTE — Anesthesia Preprocedure Evaluation (Signed)
Anesthesia Evaluation  Patient identified by MRN, date of birth, ID band Patient awake    Reviewed: Allergy & Precautions, NPO status , Patient's Chart, lab work & pertinent test results  Airway Mallampati: II   Neck ROM: full    Dental   Pulmonary former smoker,  breath sounds clear to auscultation        Cardiovascular hypertension, + Valvular Problems/Murmurs AS Rhythm:regular Rate:Normal  Moderate AS.  Cleared by cardiologist.   Neuro/Psych Depression    GI/Hepatic GERD-  ,  Endo/Other  obese  Renal/GU      Musculoskeletal  (+) Arthritis -,   Abdominal   Peds  Hematology   Anesthesia Other Findings   Reproductive/Obstetrics                             Anesthesia Physical Anesthesia Plan  ASA: III  Anesthesia Plan: General and Regional   Post-op Pain Management: MAC Combined w/ Regional for Post-op pain   Induction: Intravenous  Airway Management Planned: LMA  Additional Equipment:   Intra-op Plan:   Post-operative Plan:   Informed Consent: I have reviewed the patients History and Physical, chart, labs and discussed the procedure including the risks, benefits and alternatives for the proposed anesthesia with the patient or authorized representative who has indicated his/her understanding and acceptance.     Plan Discussed with: CRNA, Anesthesiologist and Surgeon  Anesthesia Plan Comments:         Anesthesia Quick Evaluation

## 2014-07-21 NOTE — Transfer of Care (Signed)
Immediate Anesthesia Transfer of Care Note  Patient: Joseph [name]  Procedure(s) Performed: Procedure(s): REPAIR LEFT QUADRICEP TENDON (Left)  Patient Location: PACU  Anesthesia Type:GA combined with regional for post-op pain  Level of Consciousness: awake, sedated and responds to stimulation  Airway & Oxygen Therapy: Patient Spontanous Breathing and Patient connected to face mask oxygen  Post-op Assessment: Report given to RN, Post -op Vital signs reviewed and stable and Patient moving all extremities  Post vital signs: Reviewed and stable  Last Vitals:  Filed Vitals:   07/21/14 1135  BP: 122/69  Pulse:   Temp:   Resp: 19    Complications: No apparent anesthesia complications

## 2014-07-21 NOTE — Progress Notes (Signed)
Assisted Dr. Marcie Bal with left, ultrasound guided, femoral block. Side rails up, monitors on throughout procedure. See vital signs in flow sheet. Tolerated Procedure well.

## 2014-07-21 NOTE — H&P (Signed)
PREOPERATIVE H&P  Chief Complaint: LEFT QUADRICEPS TENDON RUPTURE  HPI: Joseph [name] is a 74 y.o. male who presents for preoperative history and physical with a diagnosis of quadriceps tendon rupture, left. Symptoms are rated as moderate to severe, and have been worsening.  This is significantly impairing activities of daily living.  He has elected for surgical management. He had a previous right quadriceps tendon rupture before, that required surgical intervention. He currently cannot walk on the left side, complains of acute severe pain that was traumatic after he slipped on a stair last week.  Past Medical History  Diagnosis Date  . Hypertension   . GERD (gastroesophageal reflux disease)   . Arthritis   . Aortic stenosis   . BPH (benign prostatic hyperplasia)   . Skin cancer, basal cell   . Family history of adverse reaction to anesthesia     Pts mother had a difficult time awakening after anesthesia  . Heart murmur   . Depression   . Urinary incontinence   . Diverticulosis   . 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 Left shoulder rtc tear    . Anal fissure repair    . Tonsillectomy    . Finger surgery    . Microdiscectomy lumbar      l5-s1  . Leg surgery      tendon repair right leg  . Elbow surgery Right   . Colonoscopy w/ polypectomy    . Ventriculoperitoneal shunt Right 03/03/2014    Procedure: SHUNT INSERTION VENTRICULAR-PERITONEAL;  Surgeon: Eustace Moore, MD;  Location: [facility];  Service: Neurosurgery;  Laterality: Right;   History   Social History  . Marital Status: Married    Spouse Name: N/A  . Number of Children: N/A  . Years of Education: N/A   Social History Main Topics  . Smoking status: Former Research scientist (life sciences)  . Smokeless tobacco: Never Used     Comment: Quit in the 80's  . Alcohol Use: 1.2 - 1.8 oz/week    2-3 Glasses of wine per week     Comment: occasional 1-2 times  week  . Drug Use: No  . Sexual Activity: Not on file   Other Topics Concern  . None   Social History Narrative   Family History  Problem Relation Age of Onset  . Heart disease Mother    Allergies  Allergen Reactions  . Ace Inhibitors Cough  . Atenolol Shortness Of Breath  . Beta Adrenergic Blockers Shortness Of Breath    Moderate reaction  . Erythromycin Diarrhea  . Norvasc [Amlodipine] Other (See Comments)    unknown   Prior to Admission medications   Medication Sig Start Date End Date Taking? Authorizing Provider  aspirin EC 81 MG tablet Take 81 mg by mouth every morning.   Yes Historical Provider, MD  B Complex Vitamins (B-COMPLEX HIGH POTENCY PO) Take 1 tablet by mouth daily with supper.    Yes Historical Provider, MD  buPROPion (WELLBUTRIN XL) 300 MG 24 hr tablet Take 300 mg by mouth every morning.    Yes Historical Provider, MD  Calcium Carb-Cholecalciferol (CALCIUM 1000 + D PO) Take 1 tablet by mouth 2 (two) times daily.   Yes Historical Provider, MD  HYDROcodone-acetaminophen (NORCO/VICODIN) 5-325 MG per tablet Take 1 tablet by mouth every 4 (four) hours as needed for moderate pain. 03/04/14  Yes Eustace Moore, MD  lansoprazole (PREVACID)  30 MG capsule Take 30 mg by mouth 2 (two) times daily before a meal.    Yes Historical Provider, MD  losartan (COZAAR) 100 MG tablet Take 100 mg by mouth every morning.    Yes Historical Provider, MD  mirabegron ER (MYRBETRIQ) 50 MG TB24 tablet Take 50 mg by mouth every morning.    Yes Historical Provider, MD  nabumetone (RELAFEN) 750 MG tablet Take 1,500 mg by mouth every morning.    Yes Historical Provider, MD  psyllium (REGULOID) 0.52 G capsule Take 1.04 g by mouth daily with supper.    Yes Historical Provider, MD     Positive ROS: All other systems have been reviewed and were otherwise negative with the exception of those mentioned in the HPI and as above.  Physical Exam: General: Alert, no acute distress Cardiovascular: No pedal  edema Respiratory: No cyanosis, no use of accessory musculature GI: No organomegaly, abdomen is soft and non-tender Skin: No lesions in the area of chief complaint Neurologic: Sensation intact distally Psychiatric: Patient is competent for consent with normal mood and affect Lymphatic: No axillary or cervical lymphadenopathy  MUSCULOSKELETAL: Left leg has a palpable gap above the patella, unable to do a straight leg raise, sensation intact distally in the leg.  Assessment: Left quadriceps tendon rupture  Plan: Plan for Procedure(s): REPAIR LEFT QUADRICEP TENDON  The risks benefits and alternatives were discussed with the patient including but not limited to the risks of nonoperative treatment, versus surgical intervention including infection, bleeding, nerve injury,  blood clots, cardiopulmonary complications, morbidity, mortality, among others, and they were willing to proceed. We also discussed the risk for recurrent rupture, extensor lag, weakness, among others.  Johnny Bridge, MD [facility] [phone]   07/21/2014 10:47 AM

## 2014-07-21 NOTE — Anesthesia Postprocedure Evaluation (Signed)
Anesthesia Post Note  Patient: Joseph [name]  Procedure(s) Performed: Procedure(s) (LRB): REPAIR LEFT QUADRICEP TENDON (Left)  Anesthesia type: general  Patient location: PACU  Post pain: Pain level controlled  Post assessment: Patient's Cardiovascular Status Stable  Last Vitals:  Filed Vitals:   07/21/14 1400  BP: 136/85  Pulse: 95  Temp:   Resp: 16    Post vital signs: Reviewed and stable  Level of consciousness: sedated  Complications: No apparent anesthesia complications

## 2014-07-22 ENCOUNTER — Encounter (HOSPITAL_BASED_OUTPATIENT_CLINIC_OR_DEPARTMENT_OTHER): Payer: Self-pay | Admitting: Orthopedic Surgery

## 2014-10-20 ENCOUNTER — Other Ambulatory Visit (HOSPITAL_COMMUNITY): Payer: Self-pay | Admitting: Family Medicine

## 2014-10-20 DIAGNOSIS — I350 Nonrheumatic aortic (valve) stenosis: Secondary | ICD-10-CM

## 2014-10-25 ENCOUNTER — Other Ambulatory Visit (HOSPITAL_COMMUNITY): Payer: Medicare Other

## 2014-10-26 ENCOUNTER — Ambulatory Visit (HOSPITAL_COMMUNITY): Payer: Medicare Other | Attending: Cardiology

## 2014-10-26 ENCOUNTER — Other Ambulatory Visit: Payer: Self-pay

## 2014-10-26 DIAGNOSIS — I10 Essential (primary) hypertension: Secondary | ICD-10-CM | POA: Insufficient documentation

## 2014-10-26 DIAGNOSIS — I352 Nonrheumatic aortic (valve) stenosis with insufficiency: Secondary | ICD-10-CM | POA: Diagnosis not present

## 2014-10-26 DIAGNOSIS — Z6837 Body mass index (BMI) 37.0-37.9, adult: Secondary | ICD-10-CM | POA: Diagnosis not present

## 2014-10-26 DIAGNOSIS — I350 Nonrheumatic aortic (valve) stenosis: Secondary | ICD-10-CM

## 2014-10-26 DIAGNOSIS — I059 Rheumatic mitral valve disease, unspecified: Secondary | ICD-10-CM | POA: Insufficient documentation

## 2015-02-27 ENCOUNTER — Other Ambulatory Visit: Payer: Self-pay | Admitting: Family Medicine

## 2015-02-27 DIAGNOSIS — Z136 Encounter for screening for cardiovascular disorders: Secondary | ICD-10-CM

## 2015-02-27 DIAGNOSIS — Z789 Other specified health status: Secondary | ICD-10-CM

## 2015-03-09 ENCOUNTER — Ambulatory Visit
Admission: RE | Admit: 2015-03-09 | Discharge: 2015-03-09 | Disposition: A | Discharge status: Home / Self Care | Payer: Medicare HMO | Source: Ambulatory Visit | Attending: Family Medicine | Admitting: Family Medicine

## 2015-03-09 DIAGNOSIS — Z789 Other specified health status: Secondary | ICD-10-CM

## 2015-03-09 DIAGNOSIS — Z136 Encounter for screening for cardiovascular disorders: Secondary | ICD-10-CM

## 2015-07-19 NOTE — Progress Notes (Signed)
Patient ID: Joseph [name], male   DOB: Jun 20, 1940, 75 y.o.   MRN: [id number]     Cardiology Office Note   Date:  07/20/2015   ID:  Joseph [name], DOB 1940-10-04, MRN [id number]  PCP:  Simona Huh, MD    No chief complaint on file.   aortic stenosis  Wt Readings from Last 3 Encounters:  07/20/15 267 lb (121.11 kg)  07/21/14 260 lb (117.935 kg)  07/20/14 240 lb (108.863 kg)       History of Present Illness: Joseph [name] is a 75 y.o. male  with a history of moderate aortic stenosis. He underwent stress testing in February 2016 prior to an intracranial shunt placement. He did well with anesthesia. His stress test was mildly abnormal but it was thought perhaps the abnormality was due to diaphragmatic attenuation. He had been doing well. He does not do any regular exercise but does walk some. He denied any chest discomfort or shortness of breath.  Last year, he tripped while going down some stairs. He tore quadriceps tendon and had surgery in 6/16. That went well.   There are no chest pain or shortness of breath. He has not had any lightheadedness or fluid retention symptoms.  He now lives in [place].  He has gained weight because the food is good.  3x/week, he does water aerobics, he does chair aerobics on other days.  Many classes are available.  He knows he needs to decrease PO intake.  BP has been better.     Past Medical History  Diagnosis Date  . Hypertension   . GERD (gastroesophageal reflux disease)   . Arthritis   . Aortic stenosis   . BPH (benign prostatic hyperplasia)   . Skin cancer, basal cell   . Family history of adverse reaction to anesthesia     Pts mother had a difficult time awakening after anesthesia  . Heart murmur   . Depression   . Urinary incontinence   . Diverticulosis   . 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 Rupture of left quadriceps tendon 07/21/2014    Past Surgical History  Procedure Laterality Date    . Left shoulder rtc tear    . Anal fissure repair    . Tonsillectomy    . Finger surgery    . Microdiscectomy lumbar      l5-s1  . Leg surgery      tendon repair right leg  . Elbow surgery Right   . Colonoscopy w/ polypectomy    . Ventriculoperitoneal shunt Right 03/03/2014    Procedure: SHUNT INSERTION VENTRICULAR-PERITONEAL;  Surgeon: Eustace Moore, MD;  Location: [facility];  Service: Neurosurgery;  Laterality: Right;  . Quadriceps tendon repair Left 07/21/2014    Procedure: REPAIR LEFT QUADRICEP TENDON;  Surgeon: Marchia Bond, MD;  Location: [facility];  Service: Orthopedics;  Laterality: Left;     Current Outpatient Prescriptions  Medication Sig Dispense Refill  . acetaminophen (TYLENOL) 500 MG tablet Take 500 mg by mouth every 6 (six) hours as needed for mild pain.    Marland Kitchen aspirin EC 81 MG tablet Take 81 mg by mouth every morning.    . B Complex Vitamins (B-COMPLEX HIGH POTENCY PO) Take 1 tablet by mouth daily with supper.     . Calcium Carb-Cholecalciferol (CALCIUM 1000 + D PO) Take 3 tablets by mouth daily.     . lansoprazole (PREVACID) 30 MG capsule Take 60  mg by mouth daily at 12 noon.     Marland Kitchen losartan (COZAAR) 100 MG tablet Take 100 mg by mouth every morning.     . mirabegron ER (MYRBETRIQ) 50 MG TB24 tablet Take 50 mg by mouth every morning.     . nabumetone (RELAFEN) 750 MG tablet Take 750 mg by mouth 2 (two) times daily.     . psyllium (REGULOID) 0.52 G capsule Take 1.04 g by mouth daily with supper.      No current facility-administered medications for this visit.    Allergies:   Ace inhibitors; Atenolol; Beta adrenergic blockers; Norvasc; and Erythromycin    Social History:  The patient  reports that he has quit smoking. He has never used smokeless tobacco. He reports that he drinks about 1.2 - 1.8 oz of alcohol per week. He reports that he does not use illicit drugs.   Family History:  The patient's family history includes Heart attack in his  maternal grandmother; Heart disease in his mother. There is no history of Hypertension or Stroke.    ROS:  Please see the history of present illness.   Otherwise, review of systems are positive for weight gain.   All other systems are reviewed and negative.    PHYSICAL EXAM: VS:  BP 110/70 mmHg  Pulse 92  Ht 5\' 9"  (1.753 m)  Wt 267 lb (121.11 kg)  BMI 39.41 kg/m2 , BMI Body mass index is 39.41 kg/(m^2). GEN: Well nourished, well developed, in no acute distress HEENT: normal Neck: no JVD, carotid bruits, or masses Cardiac: RRR; 2/6 systolic murmur, no rubs, or gallops,no edema  Respiratory:  clear to auscultation bilaterally, normal work of breathing GI: soft, nontender, nondistended, + BS MS: no deformity or atrophy, Left leg bruised Skin: warm and dry, no rash Neuro:  Strength and sensation are intact Psych: euthymic mood, full affect     Recent Labs: 07/21/2014: Hemoglobin 14.8   Lipid Panel No results found for: CHOL, TRIG, HDL, CHOLHDL, VLDL, LDLCALC, LDLDIRECT      ASSESSMENT AND PLAN:  1. Aortic stenosis:  Severe in 10/16. No symptoms of severe aortic stenosis.  He remains active.  He will let us know if this changes. 2. Abnormal stress test:   Mild inferior abnormality periods test still low risk. No symptoms. Tolerated cataract surgery well before a few months ago.  Would not pursue any further w/u.  3.  Long-term, he will benefit from weight loss and exercise. I stressed the importance of this to him. 4.   obesity: he states that he thinks his weight is closer to 260 than 240.   We discussed weight loss.  He will try to cut back on portion size.  5. HTN: Well controlled.   Current medicines are reviewed at length with the patient today.  The patient concerns regarding his medicines were addressed.  The following changes have been made:  No change  Labs/ tests ordered today include:   Orders Placed This Encounter  Procedures  . EKG 12-Lead    Recommend  150 minutes/week of aerobic exercise Low fat, low carb, high fiber diet recommended  Disposition:   FU in  1 year   Signed, Larae Grooms, MD  07/20/2015 10:19 AM    [facility] [address], [place], [place]  [address] Phone: [phone]; Fax: [phone]

## 2015-07-20 ENCOUNTER — Encounter: Payer: Self-pay | Admitting: Interventional Cardiology

## 2015-07-20 ENCOUNTER — Ambulatory Visit (INDEPENDENT_AMBULATORY_CARE_PROVIDER_SITE_OTHER): Payer: Medicare HMO | Admitting: Interventional Cardiology

## 2015-07-20 DIAGNOSIS — R931 Abnormal findings on diagnostic imaging of heart and coronary circulation: Secondary | ICD-10-CM | POA: Diagnosis not present

## 2015-07-20 DIAGNOSIS — I350 Nonrheumatic aortic (valve) stenosis: Secondary | ICD-10-CM | POA: Diagnosis not present

## 2015-07-20 DIAGNOSIS — I10 Essential (primary) hypertension: Secondary | ICD-10-CM | POA: Diagnosis not present

## 2015-07-20 DIAGNOSIS — R9439 Abnormal result of other cardiovascular function study: Secondary | ICD-10-CM

## 2015-07-20 NOTE — Patient Instructions (Signed)
**Note De-identified [employer]agail Whittlesey Obfuscation** Medication Instructions:  Same-no changes  Labwork: None  Testing/Procedures: None  Follow-Up: Your physician wants you to follow-up in: 1 year. You will receive a reminder letter in the mail two months in advance. If you don't receive a letter, please call our office to schedule the follow-up appointment.      If you need a refill on your cardiac medications before your next appointment, please call your pharmacy.   

## 2015-10-25 ENCOUNTER — Other Ambulatory Visit: Payer: Self-pay | Admitting: Family Medicine

## 2015-10-25 ENCOUNTER — Ambulatory Visit
Admission: RE | Admit: 2015-10-25 | Discharge: 2015-10-25 | Disposition: A | Discharge status: Home / Self Care | Payer: Medicare HMO | Source: Ambulatory Visit | Attending: Family Medicine | Admitting: Family Medicine

## 2015-10-25 DIAGNOSIS — R52 Pain, unspecified: Secondary | ICD-10-CM

## 2016-01-31 DIAGNOSIS — S92354D Nondisplaced fracture of fifth metatarsal bone, right foot, subsequent encounter for fracture with routine healing: Secondary | ICD-10-CM | POA: Diagnosis not present

## 2016-03-12 DIAGNOSIS — G912 (Idiopathic) normal pressure hydrocephalus: Secondary | ICD-10-CM | POA: Diagnosis not present

## 2016-03-15 ENCOUNTER — Other Ambulatory Visit: Payer: Self-pay | Admitting: Neurological Surgery

## 2016-03-15 DIAGNOSIS — G912 (Idiopathic) normal pressure hydrocephalus: Secondary | ICD-10-CM

## 2016-03-21 ENCOUNTER — Ambulatory Visit
Admission: RE | Admit: 2016-03-21 | Discharge: 2016-03-21 | Disposition: A | Discharge status: Home / Self Care | Payer: PPO | Source: Ambulatory Visit | Attending: Neurological Surgery | Admitting: Neurological Surgery

## 2016-03-21 DIAGNOSIS — R42 Dizziness and giddiness: Secondary | ICD-10-CM | POA: Diagnosis not present

## 2016-03-21 DIAGNOSIS — G912 (Idiopathic) normal pressure hydrocephalus: Secondary | ICD-10-CM

## 2016-03-26 DIAGNOSIS — G912 (Idiopathic) normal pressure hydrocephalus: Secondary | ICD-10-CM | POA: Diagnosis not present

## 2016-03-27 DIAGNOSIS — Q253 Supravalvular aortic stenosis: Secondary | ICD-10-CM | POA: Diagnosis not present

## 2016-03-27 DIAGNOSIS — Z86010 Personal history of colonic polyps: Secondary | ICD-10-CM | POA: Diagnosis not present

## 2016-03-27 DIAGNOSIS — E669 Obesity, unspecified: Secondary | ICD-10-CM | POA: Diagnosis not present

## 2016-03-27 DIAGNOSIS — R7303 Prediabetes: Secondary | ICD-10-CM | POA: Diagnosis not present

## 2016-03-27 DIAGNOSIS — G912 (Idiopathic) normal pressure hydrocephalus: Secondary | ICD-10-CM | POA: Diagnosis not present

## 2016-03-27 DIAGNOSIS — Z0000 Encounter for general adult medical examination without abnormal findings: Secondary | ICD-10-CM | POA: Diagnosis not present

## 2016-03-27 DIAGNOSIS — K219 Gastro-esophageal reflux disease without esophagitis: Secondary | ICD-10-CM | POA: Diagnosis not present

## 2016-03-27 DIAGNOSIS — N400 Enlarged prostate without lower urinary tract symptoms: Secondary | ICD-10-CM | POA: Diagnosis not present

## 2016-03-27 DIAGNOSIS — F325 Major depressive disorder, single episode, in full remission: Secondary | ICD-10-CM | POA: Diagnosis not present

## 2016-03-27 DIAGNOSIS — M1990 Unspecified osteoarthritis, unspecified site: Secondary | ICD-10-CM | POA: Diagnosis not present

## 2016-03-27 DIAGNOSIS — I10 Essential (primary) hypertension: Secondary | ICD-10-CM | POA: Diagnosis not present

## 2016-05-15 DIAGNOSIS — Z13820 Encounter for screening for osteoporosis: Secondary | ICD-10-CM | POA: Diagnosis not present

## 2016-05-15 DIAGNOSIS — Z8781 Personal history of (healed) traumatic fracture: Secondary | ICD-10-CM | POA: Diagnosis not present

## 2016-05-15 DIAGNOSIS — M8588 Other specified disorders of bone density and structure, other site: Secondary | ICD-10-CM | POA: Diagnosis not present

## 2016-05-20 DIAGNOSIS — M899 Disorder of bone, unspecified: Secondary | ICD-10-CM | POA: Diagnosis not present

## 2016-06-11 DIAGNOSIS — H40013 Open angle with borderline findings, low risk, bilateral: Secondary | ICD-10-CM | POA: Diagnosis not present

## 2016-06-14 DIAGNOSIS — L814 Other melanin hyperpigmentation: Secondary | ICD-10-CM | POA: Diagnosis not present

## 2016-06-14 DIAGNOSIS — L820 Inflamed seborrheic keratosis: Secondary | ICD-10-CM | POA: Diagnosis not present

## 2016-06-14 DIAGNOSIS — L570 Actinic keratosis: Secondary | ICD-10-CM | POA: Diagnosis not present

## 2016-06-14 DIAGNOSIS — D1801 Hemangioma of skin and subcutaneous tissue: Secondary | ICD-10-CM | POA: Diagnosis not present

## 2016-06-14 DIAGNOSIS — L918 Other hypertrophic disorders of the skin: Secondary | ICD-10-CM | POA: Diagnosis not present

## 2016-06-14 DIAGNOSIS — D225 Melanocytic nevi of trunk: Secondary | ICD-10-CM | POA: Diagnosis not present

## 2016-06-14 DIAGNOSIS — L821 Other seborrheic keratosis: Secondary | ICD-10-CM | POA: Diagnosis not present

## 2016-07-19 ENCOUNTER — Ambulatory Visit: Payer: PPO | Admitting: Interventional Cardiology

## 2016-07-23 ENCOUNTER — Other Ambulatory Visit: Payer: Self-pay | Admitting: *Deleted

## 2016-07-23 NOTE — Patient Outreach (Addendum)
[facility]) Care Management  07/23/2016  Joseph [name] [birth year] [id number]   First attempt made to contact member for screening according to high risk assessment with [employer]. No answer, HIPAA compliant voice message left.  Will await call back.  If no call back, will follow up within the next week.   Update @ 1pm:  Voice message received back from member.  Call placed back to member, attempted to identify address and date of birth, member reluctant to provide information, stating "you called me, I shouldn't have to give you that information."  He is advised that this information will have to be verified for his safety and per HIPAA guidelines.  He continues to refuse to answer questions, however is receptive to verifying when stated by this care manager.  He confirms that he has had several visits with his primary MD, including one yearly to "make sure I'm not dead."    According to questionnaire, he needs assistance with ADL/IADLs, however when reviewing with member, he denies needing assistance, stating he is independent.  He denies currently smoking, and report his blood pressure is controlled with medications.  He does express concern regarding affordability of medications once he hit the donut hole.  Offered to place referral to pharmacy, he declines stating "if they're going to ask me what my date of birth and address is when they call there's no need for them to call."  He also state "there's no need for them to call if they can't eliminate the donut hole."  Assistance through [facility] pharmacist offered again, he declines.  He is receptive to receiving information packet in the mail.    Will not place referral, will not open case.   Joseph [name], [place], MSN [place] [phone]

## 2016-08-01 ENCOUNTER — Encounter: Payer: Self-pay | Admitting: *Deleted

## 2016-08-05 ENCOUNTER — Ambulatory Visit (INDEPENDENT_AMBULATORY_CARE_PROVIDER_SITE_OTHER): Payer: PPO | Admitting: Interventional Cardiology

## 2016-08-05 ENCOUNTER — Encounter: Payer: Self-pay | Admitting: Interventional Cardiology

## 2016-08-05 VITALS — BP 134/70 | HR 97 | Ht 69.0 in | Wt 274.8 lb

## 2016-08-05 DIAGNOSIS — Z6841 Body Mass Index (BMI) 40.0 and over, adult: Secondary | ICD-10-CM

## 2016-08-05 DIAGNOSIS — I350 Nonrheumatic aortic (valve) stenosis: Secondary | ICD-10-CM

## 2016-08-05 DIAGNOSIS — R9439 Abnormal result of other cardiovascular function study: Secondary | ICD-10-CM

## 2016-08-05 DIAGNOSIS — I119 Hypertensive heart disease without heart failure: Secondary | ICD-10-CM | POA: Diagnosis not present

## 2016-08-05 DIAGNOSIS — E669 Obesity, unspecified: Secondary | ICD-10-CM | POA: Diagnosis not present

## 2016-08-05 DIAGNOSIS — IMO0001 Reserved for inherently not codable concepts without codable children: Secondary | ICD-10-CM

## 2016-08-05 NOTE — Progress Notes (Signed)
Cardiology Office Note   Date:  08/05/2016   ID:  Joseph [name], DOB [birth year], MRN [id number]  PCP:  Gaynelle Arabian, MD    Chief Complaint  Patient presents with  . Follow-up  aortic stenosis   Wt Readings from Last 3 Encounters:  08/05/16 274 lb 12.8 oz (124.6 kg)  07/20/15 267 lb (121.1 kg)  07/21/14 260 lb (117.9 kg)       History of Present Illness: Joseph [name] is a 76 y.o. male  with a history of moderate aortic stenosis. He underwent stress testing in February 2016 prior to an intracranial shunt placement. He did well with anesthesia. His stress test was mildly abnormal but it was thought perhaps the abnormality was due to diaphragmatic attenuation.  He tore quadriceps tendon and had surgery in 6/16.   He moved to [place] in 2016 and gained weight because the food is good.    3x/week, he does water aerobics, he does chair aerobics on other days.  Many classes are available.   Denies : Chest pain. Dizziness. Leg edema. Nitroglycerin use. Orthopnea. Palpitations. Paroxysmal nocturnal dyspnea. Syncope.   DOE with long walks.  His legs have been a problem and this has limited walking in general.  Occasional lightheadedness when he coughs.        Past Medical History:  Diagnosis Date  . Aortic stenosis   . Arthritis   . BPH (benign prostatic hyperplasia)   . Depression   . Diverticulosis   . Family history of adverse reaction to anesthesia    Pts mother had a difficult time awakening after anesthesia  . GERD (gastroesophageal reflux disease)   . Heart murmur   . Hypertension   . 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 Rupture of left quadriceps tendon 07/21/2014  . Skin cancer, basal cell   . Urinary incontinence     Past Surgical History:  Procedure Laterality Date  . ANAL FISSURE REPAIR    . COLONOSCOPY W/ POLYPECTOMY    . ELBOW SURGERY Right   . FINGER SURGERY    . left shoulder RTC tear    . LEG SURGERY       tendon repair right leg  . MICRODISCECTOMY LUMBAR     l5-s1  . QUADRICEPS TENDON REPAIR Left 07/21/2014   Procedure: REPAIR LEFT QUADRICEP TENDON;  Surgeon: Marchia Bond, MD;  Location: [facility];  Service: Orthopedics;  Laterality: Left;  . TONSILLECTOMY    . 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   Procedure: SHUNT INSERTION VENTRICULAR-PERITONEAL;  Surgeon: Eustace Moore, MD;  Location: [facility];  Service: Neurosurgery;  Laterality: Right;     Current Outpatient Prescriptions  Medication Sig Dispense Refill  . acetaminophen (TYLENOL) 500 MG tablet Take 500 mg by mouth every 6 (six) hours as needed for mild pain.    Marland Kitchen aspirin EC 81 MG tablet Take 81 mg by mouth daily.     . B Complex Vitamins (B-COMPLEX HIGH POTENCY PO) Take 1 tablet by mouth daily with supper.     . Calcium Carb-Cholecalciferol (CALCIUM 1000 + D PO) Take 3 tablets by mouth daily.     . lansoprazole (PREVACID) 30 MG capsule Take 60 mg by mouth daily at 12 noon.     Marland Kitchen losartan (COZAAR) 100 MG tablet Take 100 mg by mouth every morning.     . mirabegron ER (MYRBETRIQ) 50 MG TB24 tablet Take 50 mg by mouth every  morning.     . nabumetone (RELAFEN) 750 MG tablet Take 750 mg by mouth 2 (two) times daily.     . psyllium (REGULOID) 0.52 G capsule Take 1.04 g by mouth daily with supper.      No current facility-administered medications for this visit.     Allergies:   Ace inhibitors; Atenolol; Beta adrenergic blockers; Norvasc [amlodipine]; and Erythromycin    Social History:  The patient  reports that he has quit smoking. He has never used smokeless tobacco. He reports that he drinks about 1.2 - 1.8 oz of alcohol per week . He reports that he does not use drugs.   Family History:  The patient's family history includes Heart attack in his maternal grandmother; Heart disease in his mother.    ROS:  Please see the history of present illness.   Otherwise, review of systems are positive for DOE,  weight increasing.   All other systems are reviewed and negative.    PHYSICAL EXAM: VS:  BP 134/70   Pulse 97   Ht 5\' 9"  (1.753 m)   Wt 274 lb 12.8 oz (124.6 kg)   BMI 40.58 kg/m  , BMI Body mass index is 40.58 kg/m. GEN: Well nourished, well developed, in no acute distress  HEENT: normal  Neck: no JVD, carotid bruits, or masses Cardiac: RRR, occasional premature beats; 3/6 murmurs, no rubs, or gallops,; tr edema  Respiratory:  clear to auscultation bilaterally, normal work of breathing GI: soft, nontender, nondistended, + BS MS: no deformity or atrophy  Skin: warm and dry, no rash Neuro:  Strength and sensation are intact Psych: euthymic mood, full affect   EKG:   The ekg ordered today demonstrates NSR, PVC, new lateral ST changes compared to prior   Recent Labs: No results found for requested labs within last 8760 hours.   Lipid Panel No results found for: CHOL, TRIG, HDL, CHOLHDL, VLDL, LDLCALC, LDLDIRECT   Other studies Reviewed: Additional studies/ records that were reviewed today with results demonstrating: 2016 echo reviewed.   ASSESSMENT AND PLAN:  1. Aortic stenosis: Noted to be severe in 2016. Difficult to determine whether he is having exertional symptoms due to his orthopedic issues. He does tolerate water aerobics. We will recheck echocardiogram to see if there is been progression of his valve disease. We will likely refer him to cardiac surgery just to get an evaluation for valve replacement. Given his overall status, he may be a candidate for TAVR, depending on his risk score. 2. Mildly abnormal stress test: It was thought to be due to diaphragmatic attenuation.  No symptoms of angina. Continue medical therapy. He will need a cardiac cath if aortic valve replacement is to take place. 3. Obesity: We spoke about portion control to help with his obesity. His weight has increased since he moved to [place]. 4. Hypertensive heart disease: Some evidence of LVH on  ECG today.   This may be related to blood pressure as well as progression of his aortic stenosis.   Current medicines are reviewed at length with the patient today.  The patient concerns regarding his medicines were addressed.  The following changes have been made:  No change  Labs/ tests ordered today include:  No orders of the defined types were placed in this encounter.   Recommend 150 minutes/week of aerobic exercise Low fat, low carb, high fiber diet recommended  Disposition:   FU for echo   Lawson Radar, MD  08/05/2016 10:04 AM  [facility] [place], [place], [place]  [address] Phone: [phone]; Fax: [phone]

## 2016-08-05 NOTE — Patient Instructions (Signed)
Medication Instructions:  Your physician recommends that you continue on your current medications as directed. Please refer to the Current Medication list given to you today.   Labwork: None ordered  Testing/Procedures: Your physician has requested that you have an echocardiogram. Echocardiography is a painless test that uses sound waves to create images of your heart. It provides your doctor with information about the size and shape of your heart and how well your heart's chambers and valves are working. This procedure takes approximately one hour. There are no restrictions for this procedure.  Follow-Up: Your physician wants you to follow-up in: 1 year with Dr. Varanasi. You will receive a reminder letter in the mail two months in advance. If you don't receive a letter, please call our office to schedule the follow-up appointment.   Any Other Special Instructions Will Be Listed Below (If Applicable).     If you need a refill on your cardiac medications before your next appointment, please call your pharmacy.   

## 2016-08-12 ENCOUNTER — Ambulatory Visit (HOSPITAL_COMMUNITY): Payer: PPO | Attending: Cardiovascular Disease

## 2016-08-12 ENCOUNTER — Other Ambulatory Visit: Payer: Self-pay

## 2016-08-12 DIAGNOSIS — I350 Nonrheumatic aortic (valve) stenosis: Secondary | ICD-10-CM

## 2016-08-16 ENCOUNTER — Other Ambulatory Visit: Payer: Self-pay

## 2016-08-16 DIAGNOSIS — I350 Nonrheumatic aortic (valve) stenosis: Secondary | ICD-10-CM

## 2016-08-28 ENCOUNTER — Institutional Professional Consult (permissible substitution) (INDEPENDENT_AMBULATORY_CARE_PROVIDER_SITE_OTHER): Payer: PPO | Admitting: Surgery

## 2016-08-28 VITALS — BP 127/73 | HR 57 | Resp 16 | Ht 69.0 in

## 2016-08-28 DIAGNOSIS — I350 Nonrheumatic aortic (valve) stenosis: Secondary | ICD-10-CM

## 2016-08-29 ENCOUNTER — Other Ambulatory Visit: Payer: PPO | Admitting: *Deleted

## 2016-08-29 ENCOUNTER — Telehealth: Payer: Self-pay | Admitting: Interventional Cardiology

## 2016-08-29 ENCOUNTER — Telehealth: Payer: Self-pay

## 2016-08-29 DIAGNOSIS — I350 Nonrheumatic aortic (valve) stenosis: Secondary | ICD-10-CM

## 2016-08-29 NOTE — Telephone Encounter (Signed)
Spoke with patient's wife and made her aware that we ask for all patient's to plan for one night of stay just in case there are complications. However, made her aware that he should be able to go home the same day. Wife verbalized understanding and thanked me for the call.

## 2016-08-29 NOTE — Telephone Encounter (Signed)
Jettie Booze, MD  Laury Deep, RN Cc: Drue Novel I, RN        Thanks Ryan.   [place], Please set him up for right and left heart cath with me.   JV   Previous Messages    ----- Message -----  From: Laury Deep, RN  Sent: 08/28/2016  7:48 PM  To: Jettie Booze, MD   Trudi Ida saw this patient today and we are going to move forward with TAVR. First he will need R/L heart cath and after that if it is okay, I will complete the rest of the TAVR workup.   Can you have your nurse set him up for a heart cath please? :)   Thanks,  Thurmond Butts

## 2016-08-29 NOTE — Telephone Encounter (Signed)
Patient scheduled for L&R heart cath with Dr. Irish Lack on 8/14 at 7:30 AM. Reviewed cath instructions with the patient. Patient aware that he needs to arrive at 5:30AM. Patient will get his labs today and pick up his instruction letter as well. Patient verbalized understanding and thanked me for the call.

## 2016-08-29 NOTE — Telephone Encounter (Signed)
New message   Pt is asking if he has to stay overnight after his catherization requests a call back

## 2016-08-30 LAB — BASIC METABOLIC PANEL
BUN / CREAT RATIO: 14 (ref 10–24)
BUN: 14 mg/dL (ref 8–27)
CALCIUM: 9.2 mg/dL (ref 8.6–10.2)
CO2: 22 mmol/L (ref 20–29)
Chloride: 106 mmol/L (ref 96–106)
Creatinine, Ser: 0.98 mg/dL (ref 0.76–1.27)
GFR, EST AFRICAN AMERICAN: 87 mL/min/{1.73_m2} (ref >59)
GFR, EST NON AFRICAN AMERICAN: 76 mL/min/{1.73_m2} (ref >59)
Glucose: 160 mg/dL — ABNORMAL HIGH (ref 65–99)
POTASSIUM: 4.6 mmol/L (ref 3.5–5.2)
Sodium: 143 mmol/L (ref 134–144)

## 2016-08-30 LAB — CBC
HEMATOCRIT: 47.2 % (ref 37.5–51.0)
HEMOGLOBIN: 15.8 g/dL (ref 13.0–17.7)
MCH: 30.7 pg (ref 26.6–33.0)
MCHC: 33.5 g/dL (ref 31.5–35.7)
MCV: 92 fL (ref 79–97)
Platelets: 271 10*3/uL (ref 150–379)
RBC: 5.14 x10E6/uL (ref 4.14–5.80)
RDW: 13.4 % (ref 12.3–15.4)
WBC: 8.1 10*3/uL (ref 3.4–10.8)

## 2016-08-30 LAB — PROTIME-INR
INR: 1.1 (ref 0.8–1.2)
Prothrombin Time: 11.2 s (ref 9.1–12.0)

## 2016-09-02 ENCOUNTER — Telehealth: Payer: Self-pay

## 2016-09-02 NOTE — Telephone Encounter (Signed)
Patient contacted pre-catheterization at [facility] scheduled for:  09/03/2016 @ 0730 Verified arrival time and place:  NT @ 0530 Confirmed AM meds to be taken pre-cath with sip of water: Take ASA Confirmed patient has responsible person to drive home post procedure and observe patient for 24 hours:  wife Addl concerns:  None noted

## 2016-09-03 ENCOUNTER — Encounter (HOSPITAL_COMMUNITY): Payer: Self-pay | Admitting: *Deleted

## 2016-09-03 ENCOUNTER — Encounter: Payer: Self-pay | Admitting: Surgery

## 2016-09-03 ENCOUNTER — Ambulatory Visit (HOSPITAL_COMMUNITY)
Admission: RE | Admit: 2016-09-03 | Discharge: 2016-09-03 | Disposition: A | Discharge status: Home / Self Care | Payer: PPO | Source: Ambulatory Visit | Attending: Interventional Cardiology | Admitting: Interventional Cardiology

## 2016-09-03 ENCOUNTER — Encounter (HOSPITAL_COMMUNITY)
Admission: RE | Disposition: A | Discharge status: Home / Self Care | Payer: Self-pay | Source: Ambulatory Visit | Attending: Interventional Cardiology

## 2016-09-03 DIAGNOSIS — I2510 Atherosclerotic heart disease of native coronary artery without angina pectoris: Secondary | ICD-10-CM | POA: Diagnosis not present

## 2016-09-03 DIAGNOSIS — Z7982 Long term (current) use of aspirin: Secondary | ICD-10-CM | POA: Diagnosis not present

## 2016-09-03 DIAGNOSIS — E669 Obesity, unspecified: Secondary | ICD-10-CM | POA: Diagnosis not present

## 2016-09-03 DIAGNOSIS — Z880 Allergy status to penicillin: Secondary | ICD-10-CM | POA: Insufficient documentation

## 2016-09-03 DIAGNOSIS — Z888 Allergy status to other drugs, medicaments and biological substances status: Secondary | ICD-10-CM | POA: Diagnosis not present

## 2016-09-03 DIAGNOSIS — Z87891 Personal history of nicotine dependence: Secondary | ICD-10-CM | POA: Diagnosis not present

## 2016-09-03 DIAGNOSIS — Z79899 Other long term (current) drug therapy: Secondary | ICD-10-CM | POA: Insufficient documentation

## 2016-09-03 DIAGNOSIS — Z85828 Personal history of other malignant neoplasm of skin: Secondary | ICD-10-CM | POA: Diagnosis not present

## 2016-09-03 DIAGNOSIS — I350 Nonrheumatic aortic (valve) stenosis: Secondary | ICD-10-CM | POA: Insufficient documentation

## 2016-09-03 DIAGNOSIS — N400 Enlarged prostate without lower urinary tract symptoms: Secondary | ICD-10-CM | POA: Diagnosis not present

## 2016-09-03 DIAGNOSIS — I119 Hypertensive heart disease without heart failure: Secondary | ICD-10-CM | POA: Diagnosis not present

## 2016-09-03 HISTORY — PX: RIGHT/LEFT HEART CATH AND CORONARY ANGIOGRAPHY: CATH118266

## 2016-09-03 LAB — POCT I-STAT 3, ART BLOOD GAS (G3+)
Acid-base deficit: 2 mmol/L (ref 0.0–2.0)
Bicarbonate: 22.2 mmol/L (ref 20.0–28.0)
O2 SAT: 96 %
PH ART: 7.394 (ref 7.350–7.450)
TCO2: 23 mmol/L (ref 0–100)
pCO2 arterial: 36.4 mmHg (ref 32.0–48.0)
pO2, Arterial: 81 mmHg — ABNORMAL LOW (ref 83.0–108.0)

## 2016-09-03 LAB — POCT I-STAT 3, VENOUS BLOOD GAS (G3P V)
ACID-BASE DEFICIT: 2 mmol/L (ref 0.0–2.0)
Bicarbonate: 24.2 mmol/L (ref 20.0–28.0)
O2 SAT: 71 %
TCO2: 25 mmol/L (ref 0–100)
pCO2, Ven: 43.6 mmHg — ABNORMAL LOW (ref 44.0–60.0)
pH, Ven: 7.352 (ref 7.250–7.430)
pO2, Ven: 39 mmHg (ref 32.0–45.0)

## 2016-09-03 SURGERY — RIGHT/LEFT HEART CATH AND CORONARY ANGIOGRAPHY
Anesthesia: LOCAL

## 2016-09-03 MED ORDER — FENTANYL CITRATE (PF) 100 MCG/2ML IJ SOLN
INTRAMUSCULAR | Status: DC | PRN
  Administered 2016-09-03 (×2): 25 ug via INTRAVENOUS

## 2016-09-03 MED ORDER — VERAPAMIL HCL 2.5 MG/ML IV SOLN
INTRAVENOUS | Status: DC | PRN
  Administered 2016-09-03: 10 mL via INTRA_ARTERIAL

## 2016-09-03 MED ORDER — IOPAMIDOL (ISOVUE-370) INJECTION 76%
INTRAVENOUS | Status: AC
  Filled 2016-09-03: qty 50

## 2016-09-03 MED ORDER — SODIUM CHLORIDE 0.9% FLUSH: 3.0000 mL | INTRAVENOUS | Status: DC | PRN

## 2016-09-03 MED ORDER — HEPARIN (PORCINE) IN NACL 2-0.9 UNIT/ML-% IJ SOLN
INTRAMUSCULAR | Status: AC | PRN
  Administered 2016-09-03: 1000 mL

## 2016-09-03 MED ORDER — IOPAMIDOL (ISOVUE-370) INJECTION 76%
INTRAVENOUS | Status: DC | PRN
  Administered 2016-09-03: 120 mL via INTRAVENOUS

## 2016-09-03 MED ORDER — SODIUM CHLORIDE 0.9 % WEIGHT BASED INFUSION
3.0000 mL/kg/h | INTRAVENOUS | Status: DC
  Administered 2016-09-03: 3 mL/kg/h via INTRAVENOUS

## 2016-09-03 MED ORDER — LIDOCAINE HCL (PF) 1 % IJ SOLN
INTRAMUSCULAR | Status: AC
  Filled 2016-09-03: qty 30

## 2016-09-03 MED ORDER — VERAPAMIL HCL 2.5 MG/ML IV SOLN
INTRAVENOUS | Status: AC
  Filled 2016-09-03: qty 2

## 2016-09-03 MED ORDER — SODIUM CHLORIDE 0.9 % IV SOLN: 250.0000 mL | INTRAVENOUS | Status: DC | PRN

## 2016-09-03 MED ORDER — MIDAZOLAM HCL 2 MG/2ML IJ SOLN
INTRAMUSCULAR | Status: DC | PRN
Start: 2016-09-03 — End: 2016-09-03
  Administered 2016-09-03: 1 mg via INTRAVENOUS
  Administered 2016-09-03: 2 mg via INTRAVENOUS

## 2016-09-03 MED ORDER — SODIUM CHLORIDE 0.9% FLUSH: 3.0000 mL | Freq: Two times a day (BID) | INTRAVENOUS | Status: DC

## 2016-09-03 MED ORDER — SODIUM CHLORIDE 0.9 % IV SOLN: INTRAVENOUS | Status: AC

## 2016-09-03 MED ORDER — SODIUM CHLORIDE 0.9 % WEIGHT BASED INFUSION: 1.0000 mL/kg/h | INTRAVENOUS | Status: DC

## 2016-09-03 MED ORDER — ASPIRIN 81 MG PO CHEW: 81.0000 mg | CHEWABLE_TABLET | ORAL | Status: DC

## 2016-09-03 MED ORDER — MIDAZOLAM HCL 2 MG/2ML IJ SOLN
INTRAMUSCULAR | Status: AC
  Filled 2016-09-03: qty 2

## 2016-09-03 MED ORDER — LIDOCAINE HCL (PF) 1 % IJ SOLN
INTRAMUSCULAR | Status: DC | PRN
Start: 2016-09-03 — End: 2016-09-03
  Administered 2016-09-03: 15 mL
  Administered 2016-09-03: 5 mL

## 2016-09-03 MED ORDER — HEPARIN (PORCINE) IN NACL 2-0.9 UNIT/ML-% IJ SOLN
INTRAMUSCULAR | Status: AC
  Filled 2016-09-03: qty 500

## 2016-09-03 MED ORDER — FENTANYL CITRATE (PF) 100 MCG/2ML IJ SOLN
INTRAMUSCULAR | Status: AC
  Filled 2016-09-03: qty 2

## 2016-09-03 MED ORDER — IOPAMIDOL (ISOVUE-370) INJECTION 76%
INTRAVENOUS | Status: AC
  Filled 2016-09-03: qty 100

## 2016-09-03 SURGICAL SUPPLY — 20 items
CATH 5FR JL3.5 JR4 ANG PIG MP (CATHETERS) ×1 IMPLANT
CATH BALLN WEDGE 5F 110CM (CATHETERS) ×1 IMPLANT
CATH INFINITI 5FR JL4 (CATHETERS) ×1 IMPLANT
CATH LAUNCHER 5F EBU3.0 (CATHETERS) IMPLANT
CATH LAUNCHER 5F EBU3.5 (CATHETERS) ×1 IMPLANT
CATHETER LAUNCHER 5F EBU3.0 (CATHETERS) ×2
COVER PRB 48X5XTLSCP FOLD TPE (BAG) IMPLANT
COVER PROBE 5X48 (BAG) ×2
DEVICE RAD COMP TR BAND LRG (VASCULAR PRODUCTS) ×1 IMPLANT
GLIDESHEATH SLEND SS 6F .021 (SHEATH) ×1 IMPLANT
GUIDEWIRE .025 260CM (WIRE) ×1 IMPLANT
GUIDEWIRE INQWIRE 1.5J.035X260 (WIRE) IMPLANT
INQWIRE 1.5J .035X260CM (WIRE) ×2
KIT HEART LEFT (KITS) ×2 IMPLANT
PACK CARDIAC CATHETERIZATION (CUSTOM PROCEDURE TRAY) ×2 IMPLANT
SHEATH GLIDE SLENDER 4/5FR (SHEATH) ×2 IMPLANT
SHEATH PINNACLE 5F 10CM (SHEATH) ×1 IMPLANT
TRANSDUCER W/STOPCOCK (MISCELLANEOUS) ×2 IMPLANT
TUBING CIL FLEX 10 FLL-RA (TUBING) ×2 IMPLANT
WIRE EMERALD 3MM-J .025X260CM (WIRE) ×1 IMPLANT

## 2016-09-03 NOTE — Progress Notes (Addendum)
Site area: RFA Site Prior to Removal:  Level 0 Pressure Applied For:20 min Manual:  yes  Patient Status During Pull:  stable Post Pull Site:  Level 0 Post Pull Instructions Given: yes  Post Pull Pulses Present: palpable Dressing Applied:  tegaderm Bedrest begins @ 9447 till 1345 Comments:

## 2016-09-03 NOTE — H&P (View-Only) (Signed)
Cardiology Office Note   Date:  08/05/2016   ID:  Joseph [name], DOB 08-21-1940, MRN [id number]  PCP:  Joseph Arabian, MD    Chief Complaint  Patient presents with  . Follow-up  aortic stenosis   Wt Readings from Last 3 Encounters:  08/05/16 274 lb 12.8 oz (124.6 kg)  07/20/15 267 lb (121.1 kg)  07/21/14 260 lb (117.9 kg)       History of Present Illness: Joseph [name] is a 76 y.o. male  with a history of moderate aortic stenosis. He underwent stress testing in February 2016 prior to an intracranial shunt placement. He did well with anesthesia. His stress test was mildly abnormal but it was thought perhaps the abnormality was due to diaphragmatic attenuation.  He tore quadriceps tendon and had surgery in 6/16.   He moved to [place] in 2016 and gained weight because the food is good.    3x/week, he does water aerobics, he does chair aerobics on other days.  Many classes are available.   Denies : Chest pain. Dizziness. Leg edema. Nitroglycerin use. Orthopnea. Palpitations. Paroxysmal nocturnal dyspnea. Syncope.   DOE with long walks.  His legs have been a problem and this has limited walking in general.  Occasional lightheadedness when he coughs.        Past Medical History:  Diagnosis Date  . Aortic stenosis   . Arthritis   . BPH (benign prostatic hyperplasia)   . Depression   . Diverticulosis   . Family history of adverse reaction to anesthesia    Pts mother had a difficult time awakening after anesthesia  . GERD (gastroesophageal reflux disease)   . Heart murmur   . Hypertension   . 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 Rupture of left quadriceps tendon 07/21/2014  . Skin cancer, basal cell   . Urinary incontinence     Past Surgical History:  Procedure Laterality Date  . ANAL FISSURE REPAIR    . COLONOSCOPY W/ POLYPECTOMY    . ELBOW SURGERY Right   . FINGER SURGERY    . left shoulder RTC tear    . LEG SURGERY       tendon repair right leg  . MICRODISCECTOMY LUMBAR     l5-s1  . QUADRICEPS TENDON REPAIR Left 07/21/2014   Procedure: REPAIR LEFT QUADRICEP TENDON;  Surgeon: Marchia Bond, MD;  Location: [facility];  Service: Orthopedics;  Laterality: Left;  . TONSILLECTOMY    . 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   Procedure: SHUNT INSERTION VENTRICULAR-PERITONEAL;  Surgeon: Eustace Moore, MD;  Location: [facility];  Service: Neurosurgery;  Laterality: Right;     Current Outpatient Prescriptions  Medication Sig Dispense Refill  . acetaminophen (TYLENOL) 500 MG tablet Take 500 mg by mouth every 6 (six) hours as needed for mild pain.    Marland Kitchen aspirin EC 81 MG tablet Take 81 mg by mouth daily.     . B Complex Vitamins (B-COMPLEX HIGH POTENCY PO) Take 1 tablet by mouth daily with supper.     . Calcium Carb-Cholecalciferol (CALCIUM 1000 + D PO) Take 3 tablets by mouth daily.     . lansoprazole (PREVACID) 30 MG capsule Take 60 mg by mouth daily at 12 noon.     Marland Kitchen losartan (COZAAR) 100 MG tablet Take 100 mg by mouth every morning.     . mirabegron ER (MYRBETRIQ) 50 MG TB24 tablet Take 50 mg by mouth every  morning.     . nabumetone (RELAFEN) 750 MG tablet Take 750 mg by mouth 2 (two) times daily.     . psyllium (REGULOID) 0.52 G capsule Take 1.04 g by mouth daily with supper.      No current facility-administered medications for this visit.     Allergies:   Ace inhibitors; Atenolol; Beta adrenergic blockers; Norvasc [amlodipine]; and Erythromycin    Social History:  The patient  reports that he has quit smoking. He has never used smokeless tobacco. He reports that he drinks about 1.2 - 1.8 oz of alcohol per week . He reports that he does not use drugs.   Family History:  The patient's family history includes Heart attack in his maternal grandmother; Heart disease in his mother.    ROS:  Please see the history of present illness.   Otherwise, review of systems are positive for DOE,  weight increasing.   All other systems are reviewed and negative.    PHYSICAL EXAM: VS:  BP 134/70   Pulse 97   Ht 5\' 9"  (1.753 m)   Wt 274 lb 12.8 oz (124.6 kg)   BMI 40.58 kg/m  , BMI Body mass index is 40.58 kg/m. GEN: Well nourished, well developed, in no acute distress  HEENT: normal  Neck: no JVD, carotid bruits, or masses Cardiac: RRR, occasional premature beats; 3/6 murmurs, no rubs, or gallops,; tr edema  Respiratory:  clear to auscultation bilaterally, normal work of breathing GI: soft, nontender, nondistended, + BS MS: no deformity or atrophy  Skin: warm and dry, no rash Neuro:  Strength and sensation are intact Psych: euthymic mood, full affect   EKG:   The ekg ordered today demonstrates NSR, PVC, new lateral ST changes compared to prior   Recent Labs: No results found for requested labs within last 8760 hours.   Lipid Panel No results found for: CHOL, TRIG, HDL, CHOLHDL, VLDL, LDLCALC, LDLDIRECT   Other studies Reviewed: Additional studies/ records that were reviewed today with results demonstrating: 2016 echo reviewed.   ASSESSMENT AND PLAN:  1. Aortic stenosis: Noted to be severe in 2016. Difficult to determine whether he is having exertional symptoms due to his orthopedic issues. He does tolerate water aerobics. We will recheck echocardiogram to see if there is been progression of his valve disease. We will likely refer him to cardiac surgery just to get an evaluation for valve replacement. Given his overall status, he may be a candidate for TAVR, depending on his risk score. 2. Mildly abnormal stress test: It was thought to be due to diaphragmatic attenuation.  No symptoms of angina. Continue medical therapy. He will need a cardiac cath if aortic valve replacement is to take place. 3. Obesity: We spoke about portion control to help with his obesity. His weight has increased since he moved to [facility]. 4. Hypertensive heart disease: Some evidence of LVH on  ECG today.   This may be related to blood pressure as well as progression of his aortic stenosis.   Current medicines are reviewed at length with the patient today.  The patient concerns regarding his medicines were addressed.  The following changes have been made:  No change  Labs/ tests ordered today include:  No orders of the defined types were placed in this encounter.   Recommend 150 minutes/week of aerobic exercise Low fat, low carb, high fiber diet recommended  Disposition:   FU for echo   Joseph Radar, MD  08/05/2016 10:04 AM  [facility] [place], [place], [place]  [address] Phone: [phone]; Fax: [phone]

## 2016-09-03 NOTE — Progress Notes (Signed)
PCP is Gaynelle Arabian, MD Referring Provider is Gaynelle Arabian, MD  Chief Complaint  Patient presents with  . Aortic Stenosis    Surgical eval for TAVR v/s AVR, ECHO 08/12/16    HPI:  The patient is a 76 year old gentleman with hypertension, obesity, normal pressure hydrocephalus s/p VP shunt in 2016, arthritis, rupture of bilateral quadriceps tendons requiring surgical repair and aortic stenosis that was severe by echo in 10/2014 with a mean gradient of 43 mm Hg and a DI of 0.17. He is not that active due to his arthritis and quadriceps tendon surgeries and obesity but does water aerobics at [employer] He does have shortness of breath with exertion during longer walks. He underwent a recent echo on 08/12/2016 showing a mean gradient of 46 mm Hg with an LVEF of 55-60%. He denies any chest pain or pressure, dizziness or syncope and peripheral edema.  Past Medical History:  Diagnosis Date  . Aortic stenosis   . Arthritis   . BPH (benign prostatic hyperplasia)   . Depression   . Diverticulosis   . Family history of adverse reaction to anesthesia    Pts mother had a difficult time awakening after anesthesia  . GERD (gastroesophageal reflux disease)   . Heart murmur   . Hypertension   . 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 Rupture of left quadriceps tendon 07/21/2014  . Skin cancer, basal cell   . Urinary incontinence     Past Surgical History:  Procedure Laterality Date  . ANAL FISSURE REPAIR    . COLONOSCOPY W/ POLYPECTOMY    . ELBOW SURGERY Right   . FINGER SURGERY    . left shoulder RTC tear    . LEG SURGERY     tendon repair right leg  . MICRODISCECTOMY LUMBAR     [id number]  . QUADRICEPS TENDON REPAIR Left 07/21/2014   Procedure: REPAIR LEFT QUADRICEP TENDON;  Surgeon: Marchia Bond, MD;  Location: [facility];  Service: Orthopedics;  Laterality: Left;  . RIGHT/LEFT HEART CATH AND CORONARY ANGIOGRAPHY N/A 09/03/2016   Procedure: RIGHT/LEFT HEART CATH AND CORONARY ANGIOGRAPHY;  Surgeon: Jettie Booze, MD;  Location: [facility];  Service: Cardiovascular;  Laterality: N/A;  . TONSILLECTOMY    . 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   Procedure: SHUNT INSERTION VENTRICULAR-PERITONEAL;  Surgeon: Eustace Moore, MD;  Location: [facility];  Service: Neurosurgery;  Laterality: Right;    Family History  Problem Relation Age of Onset  . Heart disease Mother   . Heart attack Maternal Grandmother   . Hypertension Neg Hx   . Stroke Neg Hx     Social History Social History  Substance Use Topics  . Smoking status: Former Research scientist (life sciences)  . Smokeless tobacco: Never Used     Comment: Quit in the 80's  . Alcohol use 1.2 - 1.8 oz/week    2 - 3 Glasses of wine per week     Comment: occasional 1-2 times week    Current Outpatient Prescriptions  Medication Sig Dispense Refill  . acetaminophen (TYLENOL) 500 MG tablet Take 500-1,000 mg by mouth 4 (four) times daily as needed for moderate pain.     Marland Kitchen aspirin EC 81 MG tablet Take 81 mg by mouth daily.     . B Complex Vitamins (B-COMPLEX HIGH POTENCY PO) Take 1 tablet by mouth daily.     . Calcium Carbonate-Vitamin D3 (CALCIUM 600-D) 600-400 MG-UNIT  TABS Take 3 tablets by mouth daily.    . lansoprazole (PREVACID) 30 MG capsule Take 60 mg by mouth daily.     Marland Kitchen losartan (COZAAR) 100 MG tablet Take 100 mg by mouth every morning.     . mirabegron ER (MYRBETRIQ) 50 MG TB24 tablet Take 50 mg by mouth daily.     . nabumetone (RELAFEN) 750 MG tablet Take 1,500 mg by mouth daily.     . psyllium (METAMUCIL) 0.52 g capsule Take 1.56 g by mouth daily.     No current facility-administered medications for this visit.     Allergies  Allergen Reactions  . Ace Inhibitors Cough  . Atenolol Shortness Of Breath  . Beta Adrenergic Blockers Shortness Of Breath    Moderate reaction  . Erythromycin Diarrhea  . Ciprofloxacin Other (See Comments)    ANYTHING RELATED TO  CIPRO, TENDON RUPTURES     Review of Systems  Constitutional: Positive for activity change and fatigue. Negative for appetite change, chills, fever and unexpected weight change.  HENT: Negative.        Sees his dentist regularly  Eyes: Negative.   Respiratory: Positive for cough and shortness of breath.   Cardiovascular: Negative for chest pain, palpitations and leg swelling.  Gastrointestinal:       Reflux  Endocrine: Negative.   Genitourinary: Negative.   Musculoskeletal: Positive for arthralgias and gait problem.  Skin: Negative.   Allergic/Immunologic: Negative.   Neurological: Negative for dizziness, syncope, light-headedness and headaches.  Hematological: Negative.   Psychiatric/Behavioral: Negative.     BP 127/73   Pulse (!) 57   Resp 16   Ht 5\' 9"  (1.753 m)   SpO2 95% Comment: RA Physical Exam  Constitutional: He is oriented to person, place, and time. No distress.  Obese gentleman in no distress  HENT:  Head: Normocephalic and atraumatic.  Mouth/Throat: Oropharynx is clear and moist.  Eyes: Pupils are equal, round, and reactive to light. EOM are normal.  Neck: Normal range of motion. Neck supple. No JVD present. No thyromegaly present.  Cardiovascular: Normal rate, regular rhythm and intact distal pulses.   Murmur heard. 3/6 systolic murmur RSB  Pulmonary/Chest: Effort normal and breath sounds normal. No respiratory distress.  Abdominal: Soft. Bowel sounds are normal. He exhibits no distension and no mass. There is no tenderness.  Musculoskeletal: Normal range of motion. He exhibits no edema.  Lymphadenopathy:    He has no cervical adenopathy.  Neurological: He is alert and oriented to person, place, and time. He has normal strength. No cranial nerve deficit or sensory deficit.  Skin: Skin is warm and dry.  Psychiatric: He has a normal mood and affect.     Diagnostic Tests:    [facility]                        [address], [place] [address]                            [phone]  ------------------------------------------------------------------- Transthoracic Echocardiography  Patient:    Nashaun, Hillmer MR #:       [id number] Study Date: 08/12/2016 Gender:     M Age:        76 Height:     175.3 cm  Weight:     124.6 kg BSA:        2.52 m^2 Pt. Status: Room:   SONOGRAPHER  Victorio Palm, RDCS  ATTENDING    Sanda Klein, MD  [place] [place]  cc:  ------------------------------------------------------------------- LV EF: 55% -   60%  ------------------------------------------------------------------- Indications:      (I35.0).  ------------------------------------------------------------------- History:   PMH:  Acquired from the patient and from the patient&'s chart.  ------------------------------------------------------------------- Study Conclusions  - Left ventricle: The cavity size was normal. Wall thickness was   normal. Systolic function was normal. The estimated ejection   fraction was in the range of 55% to 60%. Wall motion was normal;   there were no regional wall motion abnormalities. There was   fusion of early and atrial contributions to ventricular filling. - Aortic valve: Valve mobility was severely restricted. There was   severe stenosis. Mean gradient (S): 46 mm Hg. Peak gradient (S):   76 mm Hg. Valve area (VTI): 0.88 cm^2. Valve area (Vmax): 0.82   cm^2. Valve area (Vmean): 0.81 cm^2.  ------------------------------------------------------------------- Labs, prior tests, procedures, and surgery: Echocardiography (October 2016).     EF was 60%. Aortic valve: peak gradient of 72 mm Hg and mean gradient of 43 mm Hg.  ------------------------------------------------------------------- Study data:  Comparison was made to the study of October 2016. Study status:   Routine.  Procedure:  The patient reported no pain pre or post test. Transthoracic echocardiography for diagnosis, for left ventricular function evaluation, for right ventricular function evaluation, and for assessment of valvular function. Image quality was adequate.          Transthoracic echocardiography. M-mode, complete 2D, spectral Doppler, and color Doppler. Birthdate:  Patient birthdate: April 29, 1940.  Age:  Patient is 76 yr old.  Sex:  Gender: male.    BMI: 40.6 kg/m^2.  Blood pressure: 137/70  Patient status:  Outpatient.  Study date:  Study date: 08/12/2016. Study time: 10:46 AM.  Location:  [facility]  -------------------------------------------------------------------  ------------------------------------------------------------------- Left ventricle:  The cavity size was normal. Wall thickness was normal. Systolic function was normal. The estimated ejection fraction was in the range of 55% to 60%. Wall motion was normal; there were no regional wall motion abnormalities. There was fusion of early and atrial contributions to ventricular filling.  ------------------------------------------------------------------- Aortic valve:  Poorly visualized.  Probably trileaflet; severely thickened, severely calcified leaflets. Valve mobility was severely restricted. The jet is mid peaking.  Doppler:   There was severe stenosis.   There was no significant regurgitation.    VTI ratio of LVOT to aortic valve: 0.2. Valve area (VTI): 0.88 cm^2. Indexed valve area (VTI): 0.35 cm^2/m^2. Peak velocity ratio of LVOT to aortic valve: 0.18. Valve area (Vmax): 0.82 cm^2. Indexed valve area (Vmax): 0.33 cm^2/m^2. Mean velocity ratio of LVOT to aortic valve: 0.18. Valve area (Vmean): 0.81 cm^2. Indexed valve area (Vmean): 0.32 cm^2/m^2.    Mean gradient (S): 46 mm Hg. Peak gradient (S): 76 mm Hg.  ------------------------------------------------------------------- Aorta:  Aortic root:  The aortic root was normal in size. Ascending aorta: The ascending aorta was normal in size.  ------------------------------------------------------------------- Mitral valve:   Structurally normal valve.   Leaflet separation was normal.  Doppler:  Transvalvular velocity was within the normal range. There was no evidence for stenosis. There was no regurgitation.  ------------------------------------------------------------------- Left atrium:  The atrium was normal in size.  -------------------------------------------------------------------  Right ventricle:  The cavity size was normal. Systolic function was normal.  ------------------------------------------------------------------- Pulmonic valve:   Poorly visualized.  The valve appears to be grossly normal.    Doppler:  There was no significant regurgitation.  ------------------------------------------------------------------- Tricuspid valve:  Poorly visualized.  Structurally normal valve. Leaflet separation was normal.  Doppler:  Transvalvular velocity was within the normal range. There was no regurgitation.  ------------------------------------------------------------------- Pulmonary artery:    Systolic pressure could not be accurately estimated.  ------------------------------------------------------------------- Right atrium:  The atrium was normal in size.  ------------------------------------------------------------------- Pericardium:  There was no pericardial effusion.  ------------------------------------------------------------------- Measurements   Left ventricle                           Value           Reference  LV ID, ED, PLAX chordal          (H)     52.8   mm       43 - 52  LV ID, ES, PLAX chordal          (H)     42.7   mm       23 - 38  LV fx shortening, PLAX chordal   (L)     19     %        >=29  LV PW thickness, ED                      10.8   mm       ---------  IVS/LV PW ratio, ED                       0.88            <=1.3  Stroke volume, 2D                        111    ml       ---------  Stroke volume/bsa, 2D                    44     ml/m^2   ---------  LV e&', lateral                           8.05   cm/s     ---------  LV ejection time                         290    ms       ---------    Ventricular septum                       Value           Reference  IVS thickness, ED                        9.45   mm       ---------    LVOT                                     Value           Reference  LVOT ID, S  23.9   mm       ---------  LVOT area                                4.49   cm^2     ---------  LVOT ID                                  27     mm       ---------  LVOT peak velocity, S                    80.1   cm/s     ---------  LVOT mean velocity, S                    58.7   cm/s     ---------  LVOT VTI, S                              19.3   cm       ---------  Stroke volume (SV), LVOT DP              86.6   ml       ---------  Stroke index (SV/bsa), LVOT DP           34.4   ml/m^2   ---------    Aortic valve                             Value           Reference  Aortic valve peak velocity, S            436.54 cm/s     ---------  Aortic valve mean velocity, S            324.44 cm/s     ---------  Aortic valve VTI, S                      98.25  cm       ---------  Aortic mean gradient, S                  46     mm Hg    ---------  Aortic peak gradient, S                  76     mm Hg    ---------  VTI ratio, LVOT/AV                       0.2             ---------  Aortic valve area, VTI                   0.88   cm^2     ---------  Aortic valve area/bsa, VTI               0.35   cm^2/m^2 ---------  Velocity ratio, peak, LVOT/AV            0.18            ---------  Aortic valve area, peak velocity  0.82   cm^2     ---------  Aortic valve area/bsa, peak              0.33   cm^2/m^2 ---------  velocity  Velocity ratio, mean,  LVOT/AV            0.18            ---------  Aortic valve area, mean velocity         0.81   cm^2     ---------  Aortic valve area/bsa, mean              0.32   cm^2/m^2 ---------  velocity    Aorta                                    Value           Reference  Aortic root ID, ED                       28     mm       ---------    Left atrium                              Value           Reference  LA ID, A-P, ES                           35     mm       ---------  LA ID/bsa, A-P                           1.39   cm/m^2   <=2.2  LA volume, S                             48.9   ml       ---------  LA volume/bsa, S                         19.4   ml/m^2   ---------  LA volume, ES, 1-p A4C                   48.7   ml       ---------  LA volume/bsa, ES, 1-p A4C               19.3   ml/m^2   ---------  LA volume, ES, 1-p A2C                   47.3   ml       ---------  LA volume/bsa, ES, 1-p A2C               18.8   ml/m^2   ---------    Right atrium                             Value           Reference  RA ID, S-I, ES  51.4   mm       ---------  RA ID, M-L, ES, A4C              (L)     29.8   mm       30 - 46    Right ventricle                          Value           Reference  RV ID, minor axis, ED, A4C base          36.1   mm       ---------  TAPSE                                    26.2   mm       ---------  RV s&', lateral, S                        12.5   cm/s     ---------  Legend: (L)  and  (H)  mark values outside specified reference range.  ------------------------------------------------------------------- Prepared and Electronically Authenticated by  Sanda Klein, MD 2018-07-23T16:09:47   Physicians   Panel Physicians Referring Physician Case Authorizing Physician  Jettie Booze, MD (Primary)    Procedures   RIGHT/LEFT HEART CATH AND CORONARY ANGIOGRAPHY  Conclusion     Mid LAD lesion, 90 %stenosed. Calcified.  Mid RCA lesion,  100 %stenosed.  Prox Cx to Mid Cx lesion, 50 %stenosed.  Ramus lesion, 25 %stenosed.  CO 5.8 L/min. CI 2.4. Ao sat 96%. PA sat 71%.  Severe right subclavian tortuosity which precludes torquing catheters. Would not attempt right radial access given this tortuosity.   Will discuss with CT surgery regarding CABG/AVR.     Indications   Severe aortic stenosis [I35.0 (ICD-10-CM)]  Procedural Details/Technique   Technical Details The risks, benefits, and details of the procedure were explained to the patient. The patient verbalized understanding and wanted to proceed. Informed written consent was obtained.  PROCEDURE TECHNIQUE: After Xylocaine anesthesia a 66F slender sheath was placed in the right radial artery with a single anterior needle wall stick. Due to severe tortuosity in the right subclavian, were unable to torque catheters to engage the coronary vessels. Attempts were made with a JR4, EBU 3, EBU 3.5 and JL 3.5.  Right groin access was then obtained using ultrasound guidance in a similar fashion what is mentioned above. Right coronary angiography was done using a Judkins R4 guide catheter. Left coronary angiography was done using a Judkins L4 guide catheter. Left ventriculography was not done. A TR band was used for hemostasis.  Contrast: 120 cc    Estimated blood loss <50 mL.  During this procedure the patient was administered the following to achieve and maintain moderate conscious sedation: Versed 3 mg, Fentanyl 50 mcg, while the patient's heart rate, blood pressure, and oxygen saturation were continuously monitored. The period of conscious sedation was 59 minutes, of which I was present face-to-face 100% of this time.    Complications   Complications documented before study signed (09/03/2016 9:03 AM EDT)    No complications were associated with this study.  Documented by Jettie Booze, MD - 09/03/2016 9:00 AM EDT    Coronary Findings   Dominance: Right  Left  Anterior Descending  Mid LAD lesion, 90% stenosed. The lesion is calcified.  Ramus Intermedius  Ramus lesion, 25% stenosed.  Left Circumflex  Prox Cx to Mid Cx lesion, 50% stenosed.  Right Coronary Artery  Mid RCA lesion, 100% stenosed.  Right Posterior Descending Artery  RPDA filled by collaterals from Dist LAD.  Right Heart   Right Heart Pressures CO 5.8 L/min. CI 2.4. Ao sat 96%. PA sat 71%.    Coronary Diagrams   Diagnostic Diagram       Implants     No implant documentation for this case.  PACS Images   Show images for CARDIAC CATHETERIZATION   Link to Procedure Log   Procedure Log    Hemo Data    Most Recent Value  Fick Cardiac Output 5.82 L/min  Fick Cardiac Output Index 2.48 (L/min)/BSA  RA A Wave 12 mmHg  RA V Wave 9 mmHg  RA Mean 8 mmHg  RV Systolic Pressure 35 mmHg  RV Diastolic Pressure 9 mmHg  RV EDP 12 mmHg  PA Systolic Pressure 36 mmHg  PA Diastolic Pressure 18 mmHg  PA Mean 25 mmHg  PW A Wave 19 mmHg  PW V Wave 17 mmHg  PW Mean 17 mmHg  AO Systolic Pressure 502 mmHg  AO Diastolic Pressure 73 mmHg  AO Mean 93 mmHg  QP/QS 1  TPVR Index 10.1 HRUI  TSVR Index 37.56 HRUI  PVR SVR Ratio 0.09  TPVR/TSVR Ratio 0.27    Impression:  This 76 year old gentleman has stage D, severe, symptomatic aortic stenosis with NYHA class II symptoms of exertional fatigue and shortness of breath consistent with chronic diastolic heart failure. He is not that active due to his arthritis and prior leg surgeries but does do water aerobics. He had severe AS by echo in 10/2014 and fortunately his LVEF is still normal. I think AVR is indicated to prevent progression of symptoms and LV deterioration. His operative risk would be at least moderate due to his age, obesity and debilitation from his arthritis and prior bilateral quadriceps tendon surgeries. TAVR may be a reasonable alternative for him although he has not had a cardiac cath yet to check for significant coronary  artery disease. He had a low risk nuclear stress test in 02/2014. I think he should have a cardiac cath before doing any further TAVR workup. I discussed the pathophysiology of severe aortic stenosis with him and his wife. The patient and his wife were counseled at length regarding treatment alternatives for management of severe symptomatic aortic stenosis. Alternative approaches such as conventional aortic valve replacement, transcatheter aortic valve replacement, and palliative medical therapy were compared and contrasted at length. The risks associated with conventional surgical aortic valve replacement were been discussed in detail, as were expectations for post-operative convalescence. Long-term prognosis with medical therapy was discussed. All of his questions have been answered.  Plan:  He will have a cardiac cath scheduled with Dr. Irish Lack and if that is negative we will proceed with CT scanning for further TAVR workup.    I spent 60 minutes performing this consultation and > 50% of this time was spent face to face counseling and coordinating the care of this patient's severe aortic stenosis.   Gaye Pollack, MD [facility] [phone]

## 2016-09-03 NOTE — Progress Notes (Signed)
Patient was source for blood exposure to staff member.  Per hospital policy blood was drawn for exposure panel. 

## 2016-09-03 NOTE — Discharge Instructions (Signed)
Angiogram, Care After °This sheet gives you information about how to care for yourself after your procedure. Your health care provider may also give you more specific instructions. If you have problems or questions, contact your health care provider. °What can I expect after the procedure? °After the procedure, it is common to have bruising and tenderness at the catheter insertion area. °Follow these instructions at home: °Insertion site care °· Follow instructions from your health care provider about how to take care of your insertion site. Make sure you: °? Wash your hands with soap and water before you change your bandage (dressing). If soap and water are not available, use hand sanitizer. °? Change your dressing as told by your health care provider. °? Leave stitches (sutures), skin glue, or adhesive strips in place. These skin closures may need to stay in place for 2 weeks or longer. If adhesive strip edges start to loosen and curl up, you may trim the loose edges. Do not remove adhesive strips completely unless your health care provider tells you to do that. °· Do not take baths, swim, or use a hot tub until your health care provider approves. °· You may shower 24-48 hours after the procedure or as told by your health care provider. °? Gently wash the site with plain soap and water. °? Pat the area dry with a clean towel. °? Do not rub the site. This may cause bleeding. °· Do not apply powder or lotion to the site. Keep the site clean and dry. °· Check your insertion site every day for signs of infection. Check for: °? Redness, swelling, or pain. °? Fluid or blood. °? Warmth. °? Pus or a bad smell. °Activity °· Rest as told by your health care provider, usually for 1-2 days. °· Do not lift anything that is heavier than 10 lbs. (4.5 kg) or as told by your health care provider. °· Do not drive for 24 hours if you were given a medicine to help you relax (sedative). °· Do not drive or use heavy machinery while  taking prescription pain medicine. °General instructions °· Return to your normal activities as told by your health care provider, usually in about a week. Ask your health care provider what activities are safe for you. °· If the catheter site starts bleeding, lie flat and put pressure on the site. If the bleeding does not stop, get help right away. This is a medical emergency. °· Drink enough fluid to keep your urine clear or pale yellow. This helps flush the contrast dye from your body. °· Take over-the-counter and prescription medicines only as told by your health care provider. °· Keep all follow-up visits as told by your health care provider. This is important. °Contact a health care provider if: °· You have a fever or chills. °· You have redness, swelling, or pain around your insertion site. °· You have fluid or blood coming from your insertion site. °· The insertion site feels warm to the touch. °· You have pus or a bad smell coming from your insertion site. °· You have bruising around the insertion site. °· You notice blood collecting in the tissue around the catheter site (hematoma). The hematoma may be painful to the touch. °Get help right away if: °· You have severe pain at the catheter insertion area. °· The catheter insertion area swells very fast. °· The catheter insertion area is bleeding, and the bleeding does not stop when you hold steady pressure on the area. °·   The area near or just beyond the catheter insertion site becomes pale, cool, tingly, or numb. These symptoms may represent a serious problem that is an emergency. Do not wait to see if the symptoms will go away. Get medical help right away. Call your local emergency services (911 in the [place] Do not drive yourself to the hospital. Summary  After the procedure, it is common to have bruising and tenderness at the catheter insertion area.  After the procedure, it is important to rest and drink plenty of fluids.  Do not take baths,  swim, or use a hot tub until your health care provider says it is okay to do so. You may shower 24-48 hours after the procedure or as told by your health care provider.  If the catheter site starts bleeding, lie flat and put pressure on the site. If the bleeding does not stop, get help right away. This is a medical emergency. This information is not intended to replace advice given to you by your health care provider. Make sure you discuss any questions you have with your health care provider. Document Released: 07/26/2004 Document Revised: 12/13/2015 Document Reviewed: 12/13/2015 Elsevier Interactive Patient Education  2017 [place] Refer to this sheet in the next few weeks. These instructions provide you with information about caring for yourself after your procedure. Your health care provider may also give you more specific instructions. Your treatment has been planned according to current medical practices, but problems sometimes occur. Call your health care provider if you have any problems or questions after your procedure. What can I expect after the procedure? After your procedure, it is typical to have the following:  Bruising at the radial site that usually fades within 1-2 weeks.  Blood collecting in the tissue (hematoma) that may be painful to the touch. It should usually decrease in size and tenderness within 1-2 weeks.  Follow these instructions at home:  Take medicines only as directed by your health care provider.  You may shower 24-48 hours after the procedure or as directed by your health care provider. Remove the bandage (dressing) and gently wash the site with plain soap and water. Pat the area dry with a clean towel. Do not rub the site, because this may cause bleeding.  Do not take baths, swim, or use a hot tub until your health care provider approves.  Check your insertion site every day for redness, swelling, or drainage.  Do not apply powder or  lotion to the site.  Do not flex or bend the affected arm for 24 hours or as directed by your health care provider.  Do not push or pull heavy objects with the affected arm for 24 hours or as directed by your health care provider.  Do not lift over 10 lb (4.5 kg) for 5 days after your procedure or as directed by your health care provider.  Ask your health care provider when it is okay to: ? Return to work or school. ? Resume usual physical activities or sports. ? Resume sexual activity.  Do not drive home if you are discharged the same day as the procedure. Have someone else drive you.  You may drive 24 hours after the procedure unless otherwise instructed by your health care provider.  Do not operate machinery or power tools for 24 hours after the procedure.  If your procedure was done as an outpatient procedure, which means that you went home the same day as your procedure, a  responsible adult should be with you for the first 24 hours after you arrive home.  Keep all follow-up visits as directed by your health care provider. This is important. Contact a health care provider if:  You have a fever.  You have chills.  You have increased bleeding from the radial site. Hold pressure on the site. CALL 911 Get help right away if:  You have unusual pain at the radial site.  You have redness, warmth, or swelling at the radial site.  You have drainage (other than a small amount of blood on the dressing) from the radial site.  The radial site is bleeding, and the bleeding does not stop after 30 minutes of holding steady pressure on the site.  Your arm or hand becomes pale, cool, tingly, or numb. This information is not intended to replace advice given to you by your health care provider. Make sure you discuss any questions you have with your health care provider. Document Released: 02/09/2010 Document Revised: 06/15/2015 Document Reviewed: 07/26/2013 Elsevier Interactive Patient  Education  2018 [employer].

## 2016-09-03 NOTE — Interval H&P Note (Signed)
Cath Lab Visit (complete for each Cath Lab visit)  Clinical Evaluation Leading to the Procedure:   ACS: No.  Non-ACS:    Anginal Classification: CCS III  Anti-ischemic medical therapy: Minimal Therapy (1 class of medications)  Non-Invasive Test Results: High-risk stress test findings: cardiac mortality >3%/year, severe aortic stenosis  Prior CABG: No previous CABG   preTAVR    History and Physical Interval Note:  09/03/2016 7:38 AM  Joseph [name]  has presented today for surgery, with the diagnosis of arotic stenosis  The various methods of treatment have been discussed with the patient and family. After consideration of risks, benefits and other options for treatment, the patient has consented to  Procedure(s): RIGHT/LEFT HEART CATH AND CORONARY ANGIOGRAPHY (N/A) as a surgical intervention .  The patient's history has been reviewed, patient examined, no change in status, stable for surgery.  I have reviewed the patient's chart and labs.  Questions were answered to the patient's satisfaction.     Larae Grooms

## 2016-09-06 ENCOUNTER — Other Ambulatory Visit: Payer: Self-pay

## 2016-09-12 ENCOUNTER — Ambulatory Visit: Payer: PPO | Admitting: Surgery

## 2016-09-17 ENCOUNTER — Other Ambulatory Visit: Payer: Self-pay | Admitting: *Deleted

## 2016-09-17 ENCOUNTER — Ambulatory Visit (INDEPENDENT_AMBULATORY_CARE_PROVIDER_SITE_OTHER): Payer: PPO | Admitting: Surgery

## 2016-09-17 ENCOUNTER — Encounter: Payer: Self-pay | Admitting: Surgery

## 2016-09-17 VITALS — BP 126/74 | HR 95 | Resp 16 | Ht 69.0 in | Wt 268.0 lb

## 2016-09-17 DIAGNOSIS — I2510 Atherosclerotic heart disease of native coronary artery without angina pectoris: Secondary | ICD-10-CM

## 2016-09-17 DIAGNOSIS — I350 Nonrheumatic aortic (valve) stenosis: Secondary | ICD-10-CM | POA: Diagnosis not present

## 2016-09-17 NOTE — Progress Notes (Signed)
HPI:  [name] returns today to discuss the results of his cardiac cath and make plans for surgical treatment of his aortic stenosis. His cath shows a calcified 90% proximal to mid LAD stenosis at the takeoff of a moderate sized diagonal. The LCX has mild non-obstructive disease. The RCA is occluded in its mid-portion with faint filling of the distal vessel by collaterals from the LAD. He reports having more shortness of breath since his diagnosis of severe aortic stenosis. He denies any chest pain.  Current Outpatient Prescriptions  Medication Sig Dispense Refill  . acetaminophen (TYLENOL) 500 MG tablet Take 500-1,000 mg by mouth 4 (four) times daily as needed for moderate pain.     Marland Kitchen aspirin EC 81 MG tablet Take 81 mg by mouth daily.     . B Complex Vitamins (B-COMPLEX HIGH POTENCY PO) Take 1 tablet by mouth daily.     . Calcium Carbonate-Vitamin D3 (CALCIUM 600-D) 600-400 MG-UNIT TABS Take 3 tablets by mouth daily.    . lansoprazole (PREVACID) 30 MG capsule Take 60 mg by mouth daily.     Marland Kitchen losartan (COZAAR) 100 MG tablet Take 100 mg by mouth every morning.     . mirabegron ER (MYRBETRIQ) 50 MG TB24 tablet Take 50 mg by mouth daily.     . nabumetone (RELAFEN) 750 MG tablet Take 1,500 mg by mouth daily.     . psyllium (METAMUCIL) 0.52 g capsule Take 1.56 g by mouth daily.     No current facility-administered medications for this visit.      Physical Exam: BP 126/74 (BP Location: Left Arm, Patient Position: Sitting, Cuff Size: Large)   Pulse 95   Resp 16   Ht 5\' 9"  (1.753 m)   Wt 268 lb (121.6 kg)   SpO2 95% Comment: ON RA  BMI 39.58 kg/m  He looks well Cardiac exam shows a regular rate and rhythm with a 3/6 systolic murmur along the RSB Lungs are clear There is no peripheral edema.  Diagnostic Tests:     [facility]                        [address], [place] [address]                             [phone]  ------------------------------------------------------------------- Transthoracic Echocardiography  Patient:    Joseph [name], Joseph [name] MR #:       [id number] Study Date: 08/12/2016 Gender:     M Age:        66 Height:     175.3 cm Weight:     124.6 kg BSA:        2.52 m^2 Pt. Status: Room:   SONOGRAPHER  Victorio Palm, RDCS  ATTENDING    Sanda Klein, MD  [place] [place]  cc:  ------------------------------------------------------------------- LV EF: 55% -   60%  ------------------------------------------------------------------- Indications:      (I35.0).  ------------------------------------------------------------------- History:   PMH:  Acquired from the patient and from the patient&'s chart.  ------------------------------------------------------------------- Study Conclusions  - Left ventricle: The cavity size was normal. Wall thickness was   normal. Systolic function  was normal. The estimated ejection   fraction was in the range of 55% to 60%. Wall motion was normal;   there were no regional wall motion abnormalities. There was   fusion of early and atrial contributions to ventricular filling. - Aortic valve: Valve mobility was severely restricted. There was   severe stenosis. Mean gradient (S): 46 mm Hg. Peak gradient (S):   76 mm Hg. Valve area (VTI): 0.88 cm^2. Valve area (Vmax): 0.82   cm^2. Valve area (Vmean): 0.81 cm^2.  ------------------------------------------------------------------- Labs, prior tests, procedures, and surgery: Echocardiography (October 2016).     EF was 60%. Aortic valve: peak gradient of 72 mm Hg and mean gradient of 43 mm Hg.  ------------------------------------------------------------------- Study data:  Comparison was made to the study of October 2016. Study status:  Routine.  Procedure:  The patient reported no pain pre  or post test. Transthoracic echocardiography for diagnosis, for left ventricular function evaluation, for right ventricular function evaluation, and for assessment of valvular function. Image quality was adequate.          Transthoracic echocardiography. M-mode, complete 2D, spectral Doppler, and color Doppler. Birthdate:  Patient birthdate: 1940-04-09.  Age:  Patient is 76 yr old.  Sex:  Gender: male.    BMI: 40.6 kg/m^2.  Blood pressure: 137/70  Patient status:  Outpatient.  Study date:  Study date: 08/12/2016. Study time: 10:46 AM.  Location:  [facility]  -------------------------------------------------------------------  ------------------------------------------------------------------- Left ventricle:  The cavity size was normal. Wall thickness was normal. Systolic function was normal. The estimated ejection fraction was in the range of 55% to 60%. Wall motion was normal; there were no regional wall motion abnormalities. There was fusion of early and atrial contributions to ventricular filling.  ------------------------------------------------------------------- Aortic valve:  Poorly visualized.  Probably trileaflet; severely thickened, severely calcified leaflets. Valve mobility was severely restricted. The jet is mid peaking.  Doppler:   There was severe stenosis.   There was no significant regurgitation.    VTI ratio of LVOT to aortic valve: 0.2. Valve area (VTI): 0.88 cm^2. Indexed valve area (VTI): 0.35 cm^2/m^2. Peak velocity ratio of LVOT to aortic valve: 0.18. Valve area (Vmax): 0.82 cm^2. Indexed valve area (Vmax): 0.33 cm^2/m^2. Mean velocity ratio of LVOT to aortic valve: 0.18. Valve area (Vmean): 0.81 cm^2. Indexed valve area (Vmean): 0.32 cm^2/m^2.    Mean gradient (S): 46 mm Hg. Peak gradient (S): 76 mm Hg.  ------------------------------------------------------------------- Aorta:  Aortic root: The aortic root was normal in size. Ascending aorta: The  ascending aorta was normal in size.  ------------------------------------------------------------------- Mitral valve:   Structurally normal valve.   Leaflet separation was normal.  Doppler:  Transvalvular velocity was within the normal range. There was no evidence for stenosis. There was no regurgitation.  ------------------------------------------------------------------- Left atrium:  The atrium was normal in size.  ------------------------------------------------------------------- Right ventricle:  The cavity size was normal. Systolic function was normal.  ------------------------------------------------------------------- Pulmonic valve:   Poorly visualized.  The valve appears to be grossly normal.    Doppler:  There was no significant regurgitation.  ------------------------------------------------------------------- Tricuspid valve:  Poorly visualized.  Structurally normal valve. Leaflet separation was normal.  Doppler:  Transvalvular velocity was within the normal range. There was no regurgitation.  ------------------------------------------------------------------- Pulmonary artery:    Systolic pressure could not be accurately estimated.  ------------------------------------------------------------------- Right atrium:  The atrium was normal in size.  ------------------------------------------------------------------- Pericardium:  There was no pericardial effusion.  ------------------------------------------------------------------- Measurements   Left ventricle  Value           Reference  LV ID, ED, PLAX chordal          (H)     52.8   mm       43 - 52  LV ID, ES, PLAX chordal          (H)     42.7   mm       23 - 38  LV fx shortening, PLAX chordal   (L)     19     %        >=29  LV PW thickness, ED                      10.8   mm       ---------  IVS/LV PW ratio, ED                      0.88            <=1.3  Stroke volume, 2D                         111    ml       ---------  Stroke volume/bsa, 2D                    44     ml/m^2   ---------  LV e&', lateral                           8.05   cm/s     ---------  LV ejection time                         290    ms       ---------    Ventricular septum                       Value           Reference  IVS thickness, ED                        9.45   mm       ---------    LVOT                                     Value           Reference  LVOT ID, S                               23.9   mm       ---------  LVOT area                                4.49   cm^2     ---------  LVOT ID                                  27     mm       ---------  LVOT  peak velocity, S                    80.1   cm/s     ---------  LVOT mean velocity, S                    58.7   cm/s     ---------  LVOT VTI, S                              19.3   cm       ---------  Stroke volume (SV), LVOT DP              86.6   ml       ---------  Stroke index (SV/bsa), LVOT DP           34.4   ml/m^2   ---------    Aortic valve                             Value           Reference  Aortic valve peak velocity, S            436.54 cm/s     ---------  Aortic valve mean velocity, S            324.44 cm/s     ---------  Aortic valve VTI, S                      98.25  cm       ---------  Aortic mean gradient, S                  46     mm Hg    ---------  Aortic peak gradient, S                  76     mm Hg    ---------  VTI ratio, LVOT/AV                       0.2             ---------  Aortic valve area, VTI                   0.88   cm^2     ---------  Aortic valve area/bsa, VTI               0.35   cm^2/m^2 ---------  Velocity ratio, peak, LVOT/AV            0.18            ---------  Aortic valve area, peak velocity         0.82   cm^2     ---------  Aortic valve area/bsa, peak              0.33   cm^2/m^2 ---------  velocity  Velocity ratio, mean, LVOT/AV            0.18            ---------  Aortic valve  area, mean velocity         0.81   cm^2     ---------  Aortic valve area/bsa, mean  0.32   cm^2/m^2 ---------  velocity    Aorta                                    Value           Reference  Aortic root ID, ED                       28     mm       ---------    Left atrium                              Value           Reference  LA ID, A-P, ES                           35     mm       ---------  LA ID/bsa, A-P                           1.39   cm/m^2   <=2.2  LA volume, S                             48.9   ml       ---------  LA volume/bsa, S                         19.4   ml/m^2   ---------  LA volume, ES, 1-p A4C                   48.7   ml       ---------  LA volume/bsa, ES, 1-p A4C               19.3   ml/m^2   ---------  LA volume, ES, 1-p A2C                   47.3   ml       ---------  LA volume/bsa, ES, 1-p A2C               18.8   ml/m^2   ---------    Right atrium                             Value           Reference  RA ID, S-I, ES                           51.4   mm       ---------  RA ID, M-L, ES, A4C              (L)     29.8   mm       30 - 46    Right ventricle                          Value           Reference  RV ID, minor axis, ED, A4C  base          36.1   mm       ---------  TAPSE                                    26.2   mm       ---------  RV s&', lateral, S                        12.5   cm/s     ---------  Legend: (L)  and  (H)  mark values outside specified reference range.  ------------------------------------------------------------------- Prepared and Electronically Authenticated by  Sanda Klein, MD 2018-07-23T16:09:47   Physicians   Panel Physicians Referring Physician Case Authorizing Physician  Jettie Booze, MD (Primary)    Procedures   RIGHT/LEFT HEART CATH AND CORONARY ANGIOGRAPHY  Conclusion     Mid LAD lesion, 90 %stenosed. Calcified.  Mid RCA lesion, 100 %stenosed.  Prox Cx to Mid Cx lesion, 50  %stenosed.  Ramus lesion, 25 %stenosed.  CO 5.8 L/min. CI 2.4. Ao sat 96%. PA sat 71%.  Severe right subclavian tortuosity which precludes torquing catheters. Would not attempt right radial access given this tortuosity.   Will discuss with CT surgery regarding CABG/AVR.     Indications   Severe aortic stenosis [I35.0 (ICD-10-CM)]  Procedural Details/Technique   Technical Details The risks, benefits, and details of the procedure were explained to the patient. The patient verbalized understanding and wanted to proceed. Informed written consent was obtained.  PROCEDURE TECHNIQUE: After Xylocaine anesthesia a 30F slender sheath was placed in the right radial artery with a single anterior needle wall stick. Due to severe tortuosity in the right subclavian, were unable to torque catheters to engage the coronary vessels. Attempts were made with a JR4, EBU 3, EBU 3.5 and JL 3.5.  Right groin access was then obtained using ultrasound guidance in a similar fashion what is mentioned above. Right coronary angiography was done using a Judkins R4 guide catheter. Left coronary angiography was done using a Judkins L4 guide catheter. Left ventriculography was not done. A TR band was used for hemostasis.  Contrast: 120 cc    Estimated blood loss <50 mL.  During this procedure the patient was administered the following to achieve and maintain moderate conscious sedation: Versed 3 mg, Fentanyl 50 mcg, while the patient's heart rate, blood pressure, and oxygen saturation were continuously monitored. The period of conscious sedation was 59 minutes, of which I was present face-to-face 100% of this time.    Complications   Complications documented before study signed (09/03/2016 9:03 AM EDT)    No complications were associated with this study.  Documented by Jettie Booze, MD - 09/03/2016 9:00 AM EDT    Coronary Findings   Dominance: Right  Left Anterior Descending  Mid LAD lesion, 90%  stenosed. The lesion is calcified.  Ramus Intermedius  Ramus lesion, 25% stenosed.  Left Circumflex  Prox Cx to Mid Cx lesion, 50% stenosed.  Right Coronary Artery  Mid RCA lesion, 100% stenosed.  Right Posterior Descending Artery  RPDA filled by collaterals from Dist LAD.  Right Heart   Right Heart Pressures CO 5.8 L/min. CI 2.4. Ao sat 96%. PA sat 71%.    Coronary Diagrams   Diagnostic Diagram       Implants     No implant documentation for this  case.  PACS Images   Show images for CARDIAC CATHETERIZATION   Link to Procedure Log   Procedure Log    Hemo Data    Most Recent Value  Fick Cardiac Output 5.82 L/min  Fick Cardiac Output Index 2.48 (L/min)/BSA  RA A Wave 12 mmHg  RA V Wave 9 mmHg  RA Mean 8 mmHg  RV Systolic Pressure 35 mmHg  RV Diastolic Pressure 9 mmHg  RV EDP 12 mmHg  PA Systolic Pressure 36 mmHg  PA Diastolic Pressure 18 mmHg  PA Mean 25 mmHg  PW A Wave 19 mmHg  PW V Wave 17 mmHg  PW Mean 17 mmHg  AO Systolic Pressure 324 mmHg  AO Diastolic Pressure 73 mmHg  AO Mean 93 mmHg  QP/QS 1  TPVR Index 10.1 HRUI  TSVR Index 37.56 HRUI  PVR SVR Ratio 0.09  TPVR/TSVR Ratio 0.27      Impression:  He has severe 2-vessel coronary artery disease with a 90% proximal to mid LAD stenosis and RCA occlusion as well as severe aortic stenosis. I agree that AVR and CABG using a bioprosthetic valve is the best option for this patient. He has a ventriculo-peritoneal shunt that appears to be to the right of the midline and should not cause any problems for sternotomy. I discussed the operative procedure with the patient and his wife including alternatives, benefits and risks; including but not limited to bleeding, blood transfusion, infection, stroke, myocardial infarction, graft failure, heart block requiring a permanent pacemaker, organ dysfunction, and death.  Artha Tawni Millers understands and agrees to proceed.  He would like to have surgery as soon as  possible.   Plan:  He will be scheduled for AVR and CABG using a bioprosthetic valve on Friday 09/20/2016.   I spent 15 minutes performing this established patient visit and > 50% of this time was spent face to face counseling and coordinating the care of this patient's severe aortic stenosis and multi-vessel coronary artery disease.   Gaye Pollack, MD [facility] [phone]

## 2016-09-18 ENCOUNTER — Ambulatory Visit (HOSPITAL_COMMUNITY)
Admission: RE | Admit: 2016-09-18 | Discharge: 2016-09-18 | Disposition: A | Discharge status: Home / Self Care | Payer: PPO | Source: Ambulatory Visit | Attending: Surgery | Admitting: Surgery

## 2016-09-18 DIAGNOSIS — Z881 Allergy status to other antibiotic agents status: Secondary | ICD-10-CM | POA: Diagnosis not present

## 2016-09-18 DIAGNOSIS — I25810 Atherosclerosis of coronary artery bypass graft(s) without angina pectoris: Secondary | ICD-10-CM | POA: Diagnosis not present

## 2016-09-18 DIAGNOSIS — I350 Nonrheumatic aortic (valve) stenosis: Secondary | ICD-10-CM | POA: Diagnosis present

## 2016-09-18 DIAGNOSIS — Z7982 Long term (current) use of aspirin: Secondary | ICD-10-CM | POA: Diagnosis not present

## 2016-09-18 DIAGNOSIS — I358 Other nonrheumatic aortic valve disorders: Secondary | ICD-10-CM | POA: Diagnosis not present

## 2016-09-18 DIAGNOSIS — Z888 Allergy status to other drugs, medicaments and biological substances status: Secondary | ICD-10-CM | POA: Diagnosis not present

## 2016-09-18 DIAGNOSIS — I10 Essential (primary) hypertension: Secondary | ICD-10-CM | POA: Diagnosis not present

## 2016-09-18 DIAGNOSIS — Z85828 Personal history of other malignant neoplasm of skin: Secondary | ICD-10-CM | POA: Diagnosis not present

## 2016-09-18 DIAGNOSIS — Z6838 Body mass index (BMI) 38.0-38.9, adult: Secondary | ICD-10-CM | POA: Diagnosis not present

## 2016-09-18 DIAGNOSIS — I2510 Atherosclerotic heart disease of native coronary artery without angina pectoris: Secondary | ICD-10-CM

## 2016-09-18 DIAGNOSIS — I110 Hypertensive heart disease with heart failure: Secondary | ICD-10-CM | POA: Diagnosis present

## 2016-09-18 DIAGNOSIS — E669 Obesity, unspecified: Secondary | ICD-10-CM | POA: Diagnosis present

## 2016-09-18 DIAGNOSIS — Z87891 Personal history of nicotine dependence: Secondary | ICD-10-CM | POA: Diagnosis not present

## 2016-09-18 DIAGNOSIS — K219 Gastro-esophageal reflux disease without esophagitis: Secondary | ICD-10-CM | POA: Diagnosis present

## 2016-09-18 DIAGNOSIS — I083 Combined rheumatic disorders of mitral, aortic and tricuspid valves: Secondary | ICD-10-CM | POA: Diagnosis not present

## 2016-09-18 DIAGNOSIS — I459 Conduction disorder, unspecified: Secondary | ICD-10-CM | POA: Diagnosis not present

## 2016-09-18 DIAGNOSIS — J90 Pleural effusion, not elsewhere classified: Secondary | ICD-10-CM | POA: Diagnosis not present

## 2016-09-18 DIAGNOSIS — I517 Cardiomegaly: Secondary | ICD-10-CM | POA: Diagnosis not present

## 2016-09-18 DIAGNOSIS — I480 Paroxysmal atrial fibrillation: Secondary | ICD-10-CM | POA: Diagnosis not present

## 2016-09-18 DIAGNOSIS — I700 Atherosclerosis of aorta: Secondary | ICD-10-CM | POA: Diagnosis not present

## 2016-09-18 DIAGNOSIS — Z79899 Other long term (current) drug therapy: Secondary | ICD-10-CM | POA: Diagnosis not present

## 2016-09-18 DIAGNOSIS — R5381 Other malaise: Secondary | ICD-10-CM | POA: Diagnosis not present

## 2016-09-18 DIAGNOSIS — E119 Type 2 diabetes mellitus without complications: Secondary | ICD-10-CM | POA: Diagnosis present

## 2016-09-18 DIAGNOSIS — N400 Enlarged prostate without lower urinary tract symptoms: Secondary | ICD-10-CM | POA: Diagnosis present

## 2016-09-18 DIAGNOSIS — M1991 Primary osteoarthritis, unspecified site: Secondary | ICD-10-CM | POA: Diagnosis present

## 2016-09-18 DIAGNOSIS — Z982 Presence of cerebrospinal fluid drainage device: Secondary | ICD-10-CM | POA: Diagnosis not present

## 2016-09-18 DIAGNOSIS — D62 Acute posthemorrhagic anemia: Secondary | ICD-10-CM | POA: Diagnosis not present

## 2016-09-18 DIAGNOSIS — I5032 Chronic diastolic (congestive) heart failure: Secondary | ICD-10-CM | POA: Diagnosis present

## 2016-09-18 DIAGNOSIS — G912 (Idiopathic) normal pressure hydrocephalus: Secondary | ICD-10-CM | POA: Diagnosis present

## 2016-09-18 DIAGNOSIS — J9811 Atelectasis: Secondary | ICD-10-CM | POA: Diagnosis not present

## 2016-09-18 LAB — PULMONARY FUNCTION TEST
DL/VA % PRED: 93 %
DL/VA: 4.24 ml/min/mmHg/L
DLCO unc % pred: 90 %
DLCO unc: 27.99 ml/min/mmHg
FEF 25-75 POST: 3.95 L/s
FEF 25-75 Pre: 2.35 L/sec
FEF2575-%CHANGE-POST: 68 %
FEF2575-%PRED-POST: 182 %
FEF2575-%PRED-PRE: 108 %
FEV1-%CHANGE-POST: 9 %
FEV1-%Pred-Post: 118 %
FEV1-%Pred-Pre: 108 %
FEV1-PRE: 3.21 L
FEV1-Post: 3.51 L
FEV1FVC-%CHANGE-POST: 3 %
FEV1FVC-%PRED-PRE: 106 %
FEV6-%Change-Post: 6 %
FEV6-%Pred-Post: 112 %
FEV6-%Pred-Pre: 105 %
FEV6-Post: 4.33 L
FEV6-Pre: 4.05 L
FEV6FVC-%Change-Post: 0 %
FEV6FVC-%Pred-Post: 105 %
FEV6FVC-%Pred-Pre: 104 %
FVC-%CHANGE-POST: 5 %
FVC-%PRED-POST: 106 %
FVC-%PRED-PRE: 100 %
FVC-POST: 4.35 L
FVC-PRE: 4.12 L
POST FEV1/FVC RATIO: 81 %
PRE FEV1/FVC RATIO: 78 %
PRE FEV6/FVC RATIO: 98 %
Post FEV6/FVC ratio: 99 %
RV % pred: 82 %
RV: 2.04 L
TLC % pred: 95 %
TLC: 6.54 L

## 2016-09-18 MED ORDER — ALBUTEROL SULFATE (2.5 MG/3ML) 0.083% IN NEBU
2.5000 mg | INHALATION_SOLUTION | Freq: Once | RESPIRATORY_TRACT | Status: AC
  Administered 2016-09-18: 2.5 mg via RESPIRATORY_TRACT

## 2016-09-18 NOTE — Pre-Procedure Instructions (Signed)
Joseph [name]  09/18/2016      [facility] # [place], [place] [address] Phone: [phone] Fax: [phone]  [place], [place] - [place] [address] Phone: [phone] Fax: [place], [place] [address] Phone: [phone] Fax: [phone]    Your procedure is scheduled on Friday, September 20, 2016  Report to [facility] at [place].M.  Call this number if you have problems the morning of surgery:  [phone]   Remember:  Do not eat food or drink liquids after midnight.  Take these medicines the morning of surgery with A SIP OF WATER Tylenol if needed, lansoprazole (Prevacid), mirabegron (Myrbetriq),   nabumetone (Relafen).   STOP taking any Aspirin or Aspirin-related products, Ibuprofen, Aleve, Advil, Motrin, Goody Powders, BCs, Naproxen, Herbal Supplements,  or Vitamins 7 days prior to surgery.   Do not wear jewelry.  Do not wear lotions, powders, cologne, or deodorant.  Men may shave face and neck.  Do not bring valuables to the hospital.  [facility] is not responsible for any belongings or valuables.  Contacts, dentures or bridgework may not be worn into surgery.  Leave your suitcase in the car.  After surgery it may be brought to your room.  For patients admitted to the hospital, discharge time will be determined by your treatment team.  Patients discharged the day of surgery will not be allowed to drive home.   Special instructions:   [facility]- Preparing For Surgery  Before surgery, you can play an important role. Because skin is not sterile, your skin needs to be as free of germs as possible. You can reduce the number of germs on your skin by washing with CHG (chlorahexidine gluconate) Soap before surgery.   CHG is an antiseptic cleaner which kills germs and bonds with the skin to continue killing germs even after washing.  Please do not use if you have an allergy to CHG or antibacterial soaps. If your skin becomes reddened/irritated stop using the CHG.  Do not shave (including legs and underarms) for at least 48 hours prior to first CHG shower. It is OK to shave your face.  Please follow these instructions carefully.   1. Shower the NIGHT BEFORE SURGERY and the MORNING OF SURGERY with CHG.   2. If you chose to wash your hair, wash your hair first as usual with your normal shampoo.  3. After you shampoo, rinse your hair and body thoroughly to remove the shampoo.  4. Use CHG as you would any other liquid soap. You can apply CHG directly to the skin and wash gently with a scrungie or a clean washcloth.   5. Apply the CHG Soap to your body ONLY FROM THE NECK DOWN.  Do not use on open wounds or open sores. Avoid contact with your eyes, ears, mouth and genitals (private parts). Wash genitals (private parts) with your normal soap.  6. Wash thoroughly, paying special attention to the area where your surgery will be performed.  7. Thoroughly rinse your body with warm water from the neck down.  8. DO NOT shower/wash with your normal soap after using and rinsing off the CHG Soap.  9. Pat yourself dry with a CLEAN TOWEL.   10. Wear CLEAN PAJAMAS  11. Place CLEAN SHEETS on your bed the night of your first shower and DO NOT SLEEP WITH PETS.    Day of Surgery: Do not apply any deodorants/lotions. Please wear clean clothes to the hospital/surgery center.      Please read over the following fact sheets that you were given. Pain Booklet, Coughing and Deep Breathing, Open Heart Packet, MRSA Information and Surgical Site Infection Prevention

## 2016-09-19 ENCOUNTER — Encounter (HOSPITAL_COMMUNITY)
Admission: RE | Admit: 2016-09-19 | Discharge: 2016-09-19 | Disposition: A | Discharge status: Home / Self Care | Payer: PPO | Source: Ambulatory Visit | Attending: Surgery | Admitting: Surgery

## 2016-09-19 ENCOUNTER — Encounter (HOSPITAL_COMMUNITY): Payer: PPO

## 2016-09-19 ENCOUNTER — Encounter (HOSPITAL_COMMUNITY): Payer: Self-pay

## 2016-09-19 ENCOUNTER — Ambulatory Visit (HOSPITAL_COMMUNITY)
Admission: RE | Admit: 2016-09-19 | Discharge: 2016-09-19 | Disposition: A | Discharge status: Home / Self Care | Payer: PPO | Source: Ambulatory Visit | Attending: Surgery | Admitting: Surgery

## 2016-09-19 ENCOUNTER — Ambulatory Visit (HOSPITAL_BASED_OUTPATIENT_CLINIC_OR_DEPARTMENT_OTHER)
Admission: RE | Admit: 2016-09-19 | Discharge: 2016-09-19 | Disposition: A | Discharge status: Home / Self Care | Payer: PPO | Source: Ambulatory Visit | Attending: Surgery | Admitting: Surgery

## 2016-09-19 DIAGNOSIS — I700 Atherosclerosis of aorta: Secondary | ICD-10-CM | POA: Diagnosis not present

## 2016-09-19 DIAGNOSIS — Z01818 Encounter for other preprocedural examination: Secondary | ICD-10-CM

## 2016-09-19 DIAGNOSIS — I350 Nonrheumatic aortic (valve) stenosis: Secondary | ICD-10-CM

## 2016-09-19 DIAGNOSIS — I2510 Atherosclerotic heart disease of native coronary artery without angina pectoris: Secondary | ICD-10-CM

## 2016-09-19 DIAGNOSIS — Z01810 Encounter for preprocedural cardiovascular examination: Secondary | ICD-10-CM | POA: Insufficient documentation

## 2016-09-19 DIAGNOSIS — I10 Essential (primary) hypertension: Secondary | ICD-10-CM

## 2016-09-19 DIAGNOSIS — R918 Other nonspecific abnormal finding of lung field: Secondary | ICD-10-CM

## 2016-09-19 HISTORY — DX: Dyspnea, unspecified: R06.00

## 2016-09-19 HISTORY — DX: Prediabetes: R73.03

## 2016-09-19 LAB — COMPREHENSIVE METABOLIC PANEL
ALBUMIN: 3.7 g/dL (ref 3.5–5.0)
ALK PHOS: 44 U/L (ref 38–126)
ALT: 46 U/L (ref 17–63)
AST: 31 U/L (ref 15–41)
Anion gap: 9 (ref 5–15)
BILIRUBIN TOTAL: 1.2 mg/dL (ref 0.3–1.2)
BUN: 11 mg/dL (ref 6–20)
CALCIUM: 9.2 mg/dL (ref 8.9–10.3)
CO2: 20 mmol/L — ABNORMAL LOW (ref 22–32)
CREATININE: 1.01 mg/dL (ref 0.61–1.24)
Chloride: 108 mmol/L (ref 101–111)
GFR calc Af Amer: >60 mL/min (ref >60)
GFR calc non Af Amer: >60 mL/min (ref >60)
GLUCOSE: 126 mg/dL — AB (ref 65–99)
Potassium: 4 mmol/L (ref 3.5–5.1)
Sodium: 137 mmol/L (ref 135–145)
TOTAL PROTEIN: 6.3 g/dL — AB (ref 6.5–8.1)

## 2016-09-19 LAB — BLOOD GAS, ARTERIAL
Acid-base deficit: 1.8 mmol/L (ref 0.0–2.0)
Bicarbonate: 22 mmol/L (ref 20.0–28.0)
Drawn by: 470591
FIO2: 21
O2 Saturation: 95.7 %
PATIENT TEMPERATURE: 98.6
PCO2 ART: 33.9 mmHg (ref 32.0–48.0)
pH, Arterial: 7.427 (ref 7.350–7.450)
pO2, Arterial: 78.2 mmHg — ABNORMAL LOW (ref 83.0–108.0)

## 2016-09-19 LAB — HEMOGLOBIN A1C
Hgb A1c MFr Bld: 5.9 % — ABNORMAL HIGH (ref 4.8–5.6)
Mean Plasma Glucose: 122.63 mg/dL

## 2016-09-19 LAB — URINALYSIS, ROUTINE W REFLEX MICROSCOPIC
Bilirubin Urine: NEGATIVE
GLUCOSE, UA: NEGATIVE mg/dL
Hgb urine dipstick: NEGATIVE
KETONES UR: NEGATIVE mg/dL
LEUKOCYTES UA: NEGATIVE
NITRITE: NEGATIVE
PROTEIN: NEGATIVE mg/dL
Specific Gravity, Urine: 1.018 (ref 1.005–1.030)
pH: 5 (ref 5.0–8.0)

## 2016-09-19 LAB — TYPE AND SCREEN
ABO/RH(D): O POS
Antibody Screen: NEGATIVE

## 2016-09-19 LAB — CBC
HEMATOCRIT: 46.8 % (ref 39.0–52.0)
HEMOGLOBIN: 15.9 g/dL (ref 13.0–17.0)
MCH: 31 pg (ref 26.0–34.0)
MCHC: 34 g/dL (ref 30.0–36.0)
MCV: 91.2 fL (ref 78.0–100.0)
Platelets: 225 10*3/uL (ref 150–400)
RBC: 5.13 MIL/uL (ref 4.22–5.81)
RDW: 13.3 % (ref 11.5–15.5)
WBC: 7.7 10*3/uL (ref 4.0–10.5)

## 2016-09-19 LAB — PROTIME-INR
INR: 1.03
Prothrombin Time: 13.4 seconds (ref 11.4–15.2)

## 2016-09-19 LAB — SURGICAL PCR SCREEN
MRSA, PCR: NEGATIVE
Staphylococcus aureus: NEGATIVE

## 2016-09-19 LAB — APTT: aPTT: 31 seconds (ref 24–36)

## 2016-09-19 LAB — ABO/RH: ABO/RH(D): O POS

## 2016-09-19 MED ORDER — DEXMEDETOMIDINE HCL IN NACL 400 MCG/100ML IV SOLN
0.1000 ug/kg/h | INTRAVENOUS | Status: AC
  Administered 2016-09-20: .2 ug/kg/h via INTRAVENOUS
  Filled 2016-09-19: qty 100

## 2016-09-19 MED ORDER — PLASMA-LYTE 148 IV SOLN
INTRAVENOUS | Status: AC
  Administered 2016-09-20: 500 mL
  Filled 2016-09-19: qty 2.5

## 2016-09-19 MED ORDER — TRANEXAMIC ACID (OHS) BOLUS VIA INFUSION
15.0000 mg/kg | INTRAVENOUS | Status: AC
  Administered 2016-09-20: 1824 mg via INTRAVENOUS
  Filled 2016-09-19: qty 1824

## 2016-09-19 MED ORDER — EPINEPHRINE PF 1 MG/ML IJ SOLN
0.0000 ug/min | INTRAVENOUS | Status: DC
  Filled 2016-09-19: qty 4

## 2016-09-19 MED ORDER — TRANEXAMIC ACID 1000 MG/10ML IV SOLN
1.5000 mg/kg/h | INTRAVENOUS | Status: AC
  Administered 2016-09-20 (×2): 1.5 mg/kg/h via INTRAVENOUS
  Filled 2016-09-19: qty 25

## 2016-09-19 MED ORDER — SODIUM CHLORIDE 0.9 % IV SOLN
INTRAVENOUS | Status: AC
  Administered 2016-09-20: 1.2 [IU]/h via INTRAVENOUS
  Filled 2016-09-19: qty 1

## 2016-09-19 MED ORDER — MAGNESIUM SULFATE 50 % IJ SOLN
40.0000 meq | INTRAMUSCULAR | Status: DC
  Filled 2016-09-19: qty 10

## 2016-09-19 MED ORDER — VANCOMYCIN HCL 10 G IV SOLR
1500.0000 mg | INTRAVENOUS | Status: AC
  Administered 2016-09-20: 1500 mg via INTRAVENOUS
  Filled 2016-09-19: qty 1500

## 2016-09-19 MED ORDER — SODIUM CHLORIDE 0.9 % IV SOLN
INTRAVENOUS | Status: DC
  Filled 2016-09-19: qty 30

## 2016-09-19 MED ORDER — DEXTROSE 5 % IV SOLN
750.0000 mg | INTRAVENOUS | Status: DC
  Filled 2016-09-19: qty 750

## 2016-09-19 MED ORDER — SODIUM CHLORIDE 0.9 % IV SOLN
30.0000 ug/min | INTRAVENOUS | Status: AC
  Administered 2016-09-20: 25 ug/min via INTRAVENOUS
  Filled 2016-09-19: qty 2

## 2016-09-19 MED ORDER — POTASSIUM CHLORIDE 2 MEQ/ML IV SOLN
80.0000 meq | INTRAVENOUS | Status: DC
  Filled 2016-09-19: qty 40

## 2016-09-19 MED ORDER — TRANEXAMIC ACID (OHS) PUMP PRIME SOLUTION
2.0000 mg/kg | INTRAVENOUS | Status: DC
  Filled 2016-09-19: qty 2.43

## 2016-09-19 MED ORDER — DOPAMINE-DEXTROSE 3.2-5 MG/ML-% IV SOLN
0.0000 ug/kg/min | INTRAVENOUS | Status: DC
  Filled 2016-09-19: qty 250

## 2016-09-19 MED ORDER — NITROGLYCERIN IN D5W 200-5 MCG/ML-% IV SOLN
2.0000 ug/min | INTRAVENOUS | Status: AC
  Administered 2016-09-20: 1.5 ug/min via INTRAVENOUS
  Filled 2016-09-19: qty 250

## 2016-09-19 MED ORDER — DEXTROSE 5 % IV SOLN
1.5000 g | INTRAVENOUS | Status: AC
  Administered 2016-09-20: 1.5 g via INTRAVENOUS
  Administered 2016-09-20: .75 g via INTRAVENOUS
  Filled 2016-09-19: qty 1.5

## 2016-09-19 NOTE — Progress Notes (Signed)
PCP -  Dr. Jewel Baize  Cardiologist - Dr. Ronal Fear  Chest x-ray - 09/19/16  EKG - 09/19/16  Stress Test - Denies  ECHO - 08/12/16 (E)  Cardiac Cath - 09/03/16 (E)  PFT- 09/18/16 (E)  Sleep Study - Denies CPAP - None   Pt denies having chest pain, sob, or fever at this time. All instructions explained to the pt, with a verbal understanding of the material. Pt agrees to go over the instructions while at home for a better understanding. The opportunity to ask questions was provided.

## 2016-09-19 NOTE — Progress Notes (Signed)
Pre-op Cardiac Surgery  Carotid Findings:  Findings suggest 1-39% internal carotid artery stenosis bilaterally. There is a hyperechoic mobile area extending from the right distal common carotid artery to the proximal internal carotid artery, suggestive of possible mobile plaque versus dissection versus unknown etiology. Vertebral arteries are patent with antegrade flow.  Preliminary results discussed with Levonne Spiller of [facility].    Upper Extremity Right Left  Brachial Pressures 121-Triphasic 122-Triphasic  Radial Waveforms Triphasic Triphasic  Ulnar Waveforms Triphasic Triphasic  Palmar Arch (Allen's Test) Signal decreases >50% with radial compression, reverses with ulnar compression. Within normal limits.    Lower  Extremity Right Left  Dorsalis Pedis Triphasic Triphasic  Posterior Tibial Triphasic Triphasic    Findings:   Bilateral pedal artery waveforms are within normal limits at rest.  09/19/2016 4:06 PM Maudry Mayhew, BS, RVT, RDCS, RDMS

## 2016-09-19 NOTE — H&P (Signed)
[address]       [place] [address]             [phone]      Cardiothoracic Surgery Admission History and Physical    PCP is Gaynelle Arabian, MD Referring Provider is Gaynelle Arabian, MD      Chief Complaint  Patient presents with  . Aortic Stenosis    Surgical eval for TAVR v/s AVR, ECHO 08/12/16    HPI:  The patient is a 76 year old gentleman with hypertension, obesity, normal pressure hydrocephalus s/p VP shunt in 2016, arthritis, rupture of bilateral quadriceps tendons requiring surgical repair and aortic stenosis that was severe by echo in 10/2014 with a mean gradient of 43 mm Hg and a DI of 0.17. He is not that active due to his arthritis and quadriceps tendon surgeries and obesity but does water aerobics at [employer] He does have shortness of breath with exertion during longer walks. He underwent a recent echo on 08/12/2016 showing a mean gradient of 46 mm Hg with an LVEF of 55-60%. His cath shows a calcified 90% proximal to mid LAD stenosis at the takeoff of a moderate sized diagonal. The LCX has mild non-obstructive disease. The RCA is occluded in its mid-portion with faint filling of the distal vessel by collaterals from the LAD. He denies any chest pain or pressure, dizziness or syncope and peripheral edema.      Past Medical History:  Diagnosis Date  . Aortic stenosis   . Arthritis   . BPH (benign prostatic hyperplasia)   . Depression   . Diverticulosis   . Family history of adverse reaction to anesthesia    Pts mother had a difficult time awakening after anesthesia  . GERD (gastroesophageal reflux disease)   . Heart murmur   . Hypertension   . 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 Rupture of left quadriceps tendon 07/21/2014  . Skin cancer, basal cell   . Urinary incontinence          Past Surgical History:  Procedure Laterality Date  . ANAL FISSURE REPAIR    . COLONOSCOPY  W/ POLYPECTOMY    . ELBOW SURGERY Right   . FINGER SURGERY    . left shoulder RTC tear    . LEG SURGERY     tendon repair right leg  . MICRODISCECTOMY LUMBAR     l5-s1  . QUADRICEPS TENDON REPAIR Left 07/21/2014   Procedure: REPAIR LEFT QUADRICEP TENDON;  Surgeon: Marchia Bond, MD;  Location: [facility];  Service: Orthopedics;  Laterality: Left;  . RIGHT/LEFT HEART CATH AND CORONARY ANGIOGRAPHY N/A 09/03/2016   Procedure: RIGHT/LEFT HEART CATH AND CORONARY ANGIOGRAPHY;  Surgeon: Jettie Booze, MD;  Location: [facility];  Service: Cardiovascular;  Laterality: N/A;  . TONSILLECTOMY    . 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   Procedure: SHUNT INSERTION VENTRICULAR-PERITONEAL;  Surgeon: Eustace Moore, MD;  Location: [facility];  Service: Neurosurgery;  Laterality: Right;         Family History  Problem Relation Age of Onset  . Heart disease Mother   . Heart attack Maternal Grandmother   . Hypertension Neg Hx   . Stroke Neg Hx     Social History        Social History   Substance Use Topics   . Smoking status: Former Research scientist (life sciences)   . Smokeless tobacco: Never Used  Comment: Quit in the 80's   . Alcohol use 1.2 - 1.8 oz/week    2 - 3 Glasses of wine per week     Comment: occasional 1-2 times week           Current Outpatient Prescriptions  Medication Sig Dispense Refill  . acetaminophen (TYLENOL) 500 MG tablet Take 500-1,000 mg by mouth 4 (four) times daily as needed for moderate pain.     Marland Kitchen aspirin EC 81 MG tablet Take 81 mg by mouth daily.     . B Complex Vitamins (B-COMPLEX HIGH POTENCY PO) Take 1 tablet by mouth daily.     . Calcium Carbonate-Vitamin D3 (CALCIUM 600-D) 600-400 MG-UNIT TABS Take 3 tablets by mouth daily.    . lansoprazole (PREVACID) 30 MG capsule Take 60 mg by mouth daily.     Marland Kitchen losartan (COZAAR) 100 MG tablet Take 100 mg by mouth every morning.     . mirabegron ER (MYRBETRIQ)  50 MG TB24 tablet Take 50 mg by mouth daily.     . nabumetone (RELAFEN) 750 MG tablet Take 1,500 mg by mouth daily.     . psyllium (METAMUCIL) 0.52 g capsule Take 1.56 g by mouth daily.     No current facility-administered medications for this visit.          Allergies  Allergen Reactions  . Ace Inhibitors Cough  . Atenolol Shortness Of Breath  . Beta Adrenergic Blockers Shortness Of Breath    Moderate reaction  . Erythromycin Diarrhea  . Ciprofloxacin Other (See Comments)    ANYTHING RELATED TO CIPRO, TENDON RUPTURES     Review of Systems  Constitutional: Positive for activity change and fatigue. Negative for appetite change, chills, fever and unexpected weight change.  HENT: Negative.        Sees his dentist regularly  Eyes: Negative.   Respiratory: Positive for cough and shortness of breath.   Cardiovascular: Negative for chest pain, palpitations and leg swelling.  Gastrointestinal:       Reflux  Endocrine: Negative.   Genitourinary: Negative.   Musculoskeletal: Positive for arthralgias and gait problem.  Skin: Negative.   Allergic/Immunologic: Negative.   Neurological: Negative for dizziness, syncope, light-headedness and headaches.  Hematological: Negative.   Psychiatric/Behavioral: Negative.     BP 127/73   Pulse (!) 57   Resp 16   Ht 5\' 9"  (1.753 m)   SpO2 95% Comment: RA Physical Exam  Constitutional: He is oriented to person, place, and time. No distress.  Obese gentleman in no distress  HENT:  Head: Normocephalic and atraumatic.  Mouth/Throat: Oropharynx is clear and moist.  Eyes: Pupils are equal, round, and reactive to light. EOM are normal.  Neck: Normal range of motion. Neck supple. No JVD present. No thyromegaly present.  Cardiovascular: Normal rate, regular rhythm and intact distal pulses.   Murmur heard. 3/6 systolic murmur RSB  Pulmonary/Chest: Effort normal and breath sounds normal. No respiratory distress.  Abdominal: Soft.  Bowel sounds are normal. He exhibits no distension and no mass. There is no tenderness.  Musculoskeletal: Normal range of motion. He exhibits no edema.  Lymphadenopathy:    He has no cervical adenopathy.  Neurological: He is alert and oriented to person, place, and time. He has normal strength. No cranial nerve deficit or sensory deficit.  Skin: Skin is warm and dry.  Psychiatric: He has a normal mood and affect.     Diagnostic Tests:  [facility] [address],  [place] [address] [phone]  ------------------------------------------------------------------- Transthoracic Echocardiography  Patient: Joseph [name], Joseph [name] MR #: [id number] Study Date: 08/12/2016 Gender: M Age: 66 Height: 175.3 cm Weight: 124.6 kg BSA: 2.52 m^2 Pt. Status: Room:  SONOGRAPHER Victorio Palm, RDCS ATTENDING Sanda Klein, MD [place], [place], Outpatient  cc:  ------------------------------------------------------------------- LV EF: 55% - 60%  ------------------------------------------------------------------- Indications: (I35.0).  ------------------------------------------------------------------- History: PMH: Acquired from the patient and from the patient&'s chart.  ------------------------------------------------------------------- Study Conclusions  - Left ventricle: The cavity size was normal. Wall thickness was normal. Systolic function was normal. The estimated ejection fraction was in the range of 55% to 60%. Wall motion was normal; there were no regional wall motion abnormalities. There was fusion of early and atrial contributions to ventricular filling. - Aortic valve: Valve mobility was severely restricted. There was severe stenosis. Mean  gradient (S): 46 mm Hg. Peak gradient (S): 76 mm Hg. Valve area (VTI): 0.88 cm^2. Valve area (Vmax): 0.82 cm^2. Valve area (Vmean): 0.81 cm^2.  ------------------------------------------------------------------- Labs, prior tests, procedures, and surgery: Echocardiography (October 2016). EF was 60%. Aortic valve: peak gradient of 72 mm Hg and mean gradient of 43 mm Hg.  ------------------------------------------------------------------- Study data: Comparison was made to the study of October 2016. Study status: Routine. Procedure: The patient reported no pain pre or post test. Transthoracic echocardiography for diagnosis, for left ventricular function evaluation, for right ventricular function evaluation, and for assessment of valvular function. Image quality was adequate. Transthoracic echocardiography. M-mode, complete 2D, spectral Doppler, and color Doppler. Birthdate: Patient birthdate: 1940-04-05. Age: Patient is 76 yr old. Sex: Gender: male. BMI: 40.6 kg/m^2. Blood pressure: 137/70 Patient status: Outpatient. Study date: Study date: 08/12/2016. Study time: 10:46 AM. Location: [facility]  -------------------------------------------------------------------  ------------------------------------------------------------------- Left ventricle: The cavity size was normal. Wall thickness was normal. Systolic function was normal. The estimated ejection fraction was in the range of 55% to 60%. Wall motion was normal; there were no regional wall motion abnormalities. There was fusion of early and atrial contributions to ventricular filling.  ------------------------------------------------------------------- Aortic valve: Poorly visualized. Probably trileaflet; severely thickened, severely calcified leaflets. Valve mobility was severely restricted. The jet is mid peaking. Doppler: There was severe stenosis. There was no significant  regurgitation. VTI ratio of LVOT to aortic valve: 0.2. Valve area (VTI): 0.88 cm^2. Indexed valve area (VTI): 0.35 cm^2/m^2. Peak velocity ratio of LVOT to aortic valve: 0.18. Valve area (Vmax): 0.82 cm^2. Indexed valve area (Vmax): 0.33 cm^2/m^2. Mean velocity ratio of LVOT to aortic valve: 0.18. Valve area (Vmean): 0.81 cm^2. Indexed valve area (Vmean): 0.32 cm^2/m^2. Mean gradient (S): 46 mm Hg. Peak gradient (S): 76 mm Hg.  ------------------------------------------------------------------- Aorta: Aortic root: The aortic root was normal in size. Ascending aorta: The ascending aorta was normal in size.  ------------------------------------------------------------------- Mitral valve: Structurally normal valve. Leaflet separation was normal. Doppler: Transvalvular velocity was within the normal range. There was no evidence for stenosis. There was no regurgitation.  ------------------------------------------------------------------- Left atrium: The atrium was normal in size.  ------------------------------------------------------------------- Right ventricle: The cavity size was normal. Systolic function was normal.  ------------------------------------------------------------------- Pulmonic valve: Poorly visualized. The valve appears to be grossly normal. Doppler: There was no significant regurgitation.  ------------------------------------------------------------------- Tricuspid valve: Poorly visualized. Structurally normal valve. Leaflet separation was normal. Doppler: Transvalvular velocity was within the normal range. There was no regurgitation.  ------------------------------------------------------------------- Pulmonary artery: Systolic pressure could not be accurately estimated.  ------------------------------------------------------------------- Right atrium: The atrium was normal in  size.  ------------------------------------------------------------------- Pericardium:  There was no pericardial effusion.  ------------------------------------------------------------------- Measurements  Left ventricle Value Reference LV ID, ED, PLAX chordal (H) 52.8 mm 43 - 52 LV ID, ES, PLAX chordal (H) 42.7 mm 23 - 38 LV fx shortening, PLAX chordal (L) 19 % >=29 LV PW thickness, ED 10.8 mm --------- IVS/LV PW ratio, ED 0.88 <=1.3 Stroke volume, 2D 111 ml --------- Stroke volume/bsa, 2D 44 ml/m^2 --------- LV e&', lateral 8.05 cm/s --------- LV ejection time 290 ms ---------  Ventricular septum Value Reference IVS thickness, ED 9.45 mm ---------  LVOT Value Reference LVOT ID, S 23.9 mm --------- LVOT area 4.49 cm^2 --------- LVOT ID 27 mm --------- LVOT peak velocity, S 80.1 cm/s --------- LVOT mean velocity, S 58.7 cm/s --------- LVOT VTI, S 19.3 cm --------- Stroke volume (SV), LVOT DP 86.6 ml --------- Stroke index (SV/bsa), LVOT DP 34.4 ml/m^2 ---------  Aortic valve Value Reference Aortic valve peak velocity, S 436.54 cm/s --------- Aortic valve mean velocity, S 324.44 cm/s --------- Aortic valve VTI, S 98.25 cm --------- Aortic  mean gradient, S 46 mm Hg --------- Aortic peak gradient, S 76 mm Hg --------- VTI ratio, LVOT/AV 0.2 --------- Aortic valve area, VTI 0.88 cm^2 --------- Aortic valve area/bsa, VTI 0.35 cm^2/m^2 --------- Velocity ratio, peak, LVOT/AV 0.18 --------- Aortic valve area, peak velocity 0.82 cm^2 --------- Aortic valve area/bsa, peak 0.33 cm^2/m^2 --------- velocity Velocity ratio, mean, LVOT/AV 0.18 --------- Aortic valve area, mean velocity 0.81 cm^2 --------- Aortic valve area/bsa, mean 0.32 cm^2/m^2 --------- velocity  Aorta Value Reference Aortic root ID, ED 28 mm ---------  Left atrium Value Reference LA ID, A-P, ES 35 mm --------- LA ID/bsa, A-P 1.39 cm/m^2 <=2.2 LA volume, S 48.9 ml --------- LA volume/bsa, S 19.4 ml/m^2 --------- LA volume, ES, 1-p A4C 48.7 ml --------- LA volume/bsa, ES, 1-p A4C 19.3 ml/m^2 --------- LA volume, ES, 1-p A2C 47.3 ml --------- LA volume/bsa, ES, 1-p A2C 18.8 ml/m^2 ---------  Right atrium Value Reference RA ID, S-I, ES 51.4 mm --------- RA ID, M-L, ES, A4C (L) 29.8 mm 30 - 46  Right ventricle Value Reference RV ID, minor axis, ED, A4C base 36.1 mm --------- TAPSE 26.2 mm  --------- RV s&', lateral, S 12.5 cm/s ---------  Legend: (L) and (H) mark values outside specified reference range.  ------------------------------------------------------------------- Prepared and Electronically Authenticated by  Sanda Klein, MD 2018-07-23T16:09:47   Physicians   Panel Physicians Referring Physician Case Authorizing Physician  Jettie Booze, MD (Primary)    Procedures   RIGHT/LEFT HEART CATH AND CORONARY ANGIOGRAPHY  Conclusion     Mid LAD lesion, 90 %stenosed. Calcified.  Mid RCA lesion, 100 %stenosed.  Prox Cx to Mid Cx lesion, 50 %stenosed.  Ramus lesion, 25 %stenosed.  CO 5.8 L/min. CI 2.4. Ao sat 96%. PA sat 71%.  Severe right subclavian tortuosity which precludes torquing catheters. Would not attempt right radial access given this tortuosity.  Will discuss with CT surgery regarding CABG/AVR.    Indications   Severe aortic stenosis [I35.0 (ICD-10-CM)]  Procedural Details/Technique   Technical Details The risks, benefits, and details of the procedure were explained to the patient. The patient verbalized understanding and wanted to proceed. Informed written consent was obtained.  PROCEDURE TECHNIQUE: After Xylocaine anesthesia a 36F slender sheath was placed in the right radial artery with a single anterior needle wall stick. Due to severe tortuosity in the right subclavian, were unable to torque catheters to engage the coronary vessels. Attempts were made with a JR4, EBU 3, EBU 3.5 and JL  3.5.  Right groin access was then obtained using ultrasound guidance in a similar fashion what is mentioned above. Right coronary angiography was done using a Judkins R4 guide catheter. Left coronary angiography was done using a Judkins L4 guide catheter. Left ventriculography was not done. A TR band was used for hemostasis.  Contrast: 120 cc    Estimated blood loss <50 mL.  During this  procedure the patient was administered the following to achieve and maintain moderate conscious sedation: Versed 3 mg, Fentanyl 50 mcg, while the patient's heart rate, blood pressure, and oxygen saturation were continuously monitored. The period of conscious sedation was 59 minutes, of which I was present face-to-face 100% of this time.    Complications   Complications documented before study signed (09/03/2016 9:03 AM EDT)    No complications were associated with this study.  Documented by Jettie Booze, MD - 09/03/2016 9:00 AM EDT    Coronary Findings   Dominance: Right  Left Anterior Descending  Mid LAD lesion, 90% stenosed. The lesion is calcified.  Ramus Intermedius  Ramus lesion, 25% stenosed.  Left Circumflex  Prox Cx to Mid Cx lesion, 50% stenosed.  Right Coronary Artery  Mid RCA lesion, 100% stenosed.  Right Posterior Descending Artery  RPDA filled by collaterals from Dist LAD.  Right Heart   Right Heart Pressures CO 5.8 L/min. CI 2.4. Ao sat 96%. PA sat 71%.    Coronary Diagrams   Diagnostic Diagram       Implants        No implant documentation for this case.  PACS Images   Show images for CARDIAC CATHETERIZATION   Link to Procedure Log   Procedure Log    Hemo Data    Most Recent Value  Fick Cardiac Output 5.82 L/min  Fick Cardiac Output Index 2.48 (L/min)/BSA  RA A Wave 12 mmHg  RA V Wave 9 mmHg  RA Mean 8 mmHg  RV Systolic Pressure 35 mmHg  RV Diastolic Pressure 9 mmHg  RV EDP 12 mmHg  PA Systolic Pressure 36 mmHg  PA Diastolic Pressure 18 mmHg  PA Mean 25 mmHg  PW A Wave 19 mmHg  PW V Wave 17 mmHg  PW Mean 17 mmHg  AO Systolic Pressure 616 mmHg  AO Diastolic Pressure 73 mmHg  AO Mean 93 mmHg  QP/QS 1  TPVR Index 10.1 HRUI  TSVR Index 37.56 HRUI  PVR SVR Ratio 0.09  TPVR/TSVR Ratio 0.27    Impression:  This 76 year old gentleman has stage D, severe, symptomatic aortic stenosis with NYHA class II symptoms of  exertional fatigue and shortness of breath consistent with chronic diastolic heart failure. He is not that active due to his arthritis and prior leg surgeries but does do water aerobics. He had severe AS by echo in 10/2014 and fortunately his LVEF is still normal. I think AVR is indicated to prevent progression of symptoms and LV deterioration.   He has severe 2-vessel coronary artery disease with a 90% proximal to mid LAD stenosis and RCA occlusion as well as severe aortic stenosis. I agree that AVR and CABG using a bioprosthetic valve is the best option for this patient. He has a ventriculo-peritoneal shunt that appears to be to the right of the midline and should not cause any problems for sternotomy. I discussed the operative procedure with the patient and his wife including alternatives, benefits and risks; including but not limited to bleeding, blood transfusion, infection, stroke, myocardial infarction, graft failure,  heart block requiring a permanent pacemaker, organ dysfunction, and death.  Joseph [name] understands and agrees to proceed.   Plan:  AVR and CABG using a bioprosthetic aortic valve.   Gaye Pollack, MD [facility] [phone]

## 2016-09-20 ENCOUNTER — Inpatient Hospital Stay (HOSPITAL_COMMUNITY): Payer: PPO

## 2016-09-20 ENCOUNTER — Inpatient Hospital Stay (HOSPITAL_COMMUNITY): Payer: PPO | Admitting: Anesthesiology

## 2016-09-20 ENCOUNTER — Encounter (HOSPITAL_COMMUNITY)
Admission: RE | Disposition: A | Discharge status: Home Health | Payer: Self-pay | Source: Home / Self Care | Attending: Surgery

## 2016-09-20 ENCOUNTER — Encounter (HOSPITAL_COMMUNITY): Payer: Self-pay

## 2016-09-20 ENCOUNTER — Inpatient Hospital Stay (HOSPITAL_COMMUNITY)
Admission: RE | Admit: 2016-09-20 | Discharge: 2016-09-27 | DRG: 220 | Disposition: A | Discharge status: Home Health | Payer: PPO | Attending: Surgery | Admitting: Surgery

## 2016-09-20 DIAGNOSIS — M1991 Primary osteoarthritis, unspecified site: Secondary | ICD-10-CM | POA: Diagnosis present

## 2016-09-20 DIAGNOSIS — G912 (Idiopathic) normal pressure hydrocephalus: Secondary | ICD-10-CM | POA: Diagnosis present

## 2016-09-20 DIAGNOSIS — Z79899 Other long term (current) drug therapy: Secondary | ICD-10-CM

## 2016-09-20 DIAGNOSIS — Z85828 Personal history of other malignant neoplasm of skin: Secondary | ICD-10-CM | POA: Diagnosis not present

## 2016-09-20 DIAGNOSIS — Z6838 Body mass index (BMI) 38.0-38.9, adult: Secondary | ICD-10-CM | POA: Diagnosis not present

## 2016-09-20 DIAGNOSIS — Z7982 Long term (current) use of aspirin: Secondary | ICD-10-CM | POA: Diagnosis not present

## 2016-09-20 DIAGNOSIS — I358 Other nonrheumatic aortic valve disorders: Secondary | ICD-10-CM | POA: Diagnosis not present

## 2016-09-20 DIAGNOSIS — K219 Gastro-esophageal reflux disease without esophagitis: Secondary | ICD-10-CM | POA: Diagnosis present

## 2016-09-20 DIAGNOSIS — Z87891 Personal history of nicotine dependence: Secondary | ICD-10-CM

## 2016-09-20 DIAGNOSIS — Z982 Presence of cerebrospinal fluid drainage device: Secondary | ICD-10-CM | POA: Diagnosis not present

## 2016-09-20 DIAGNOSIS — Z951 Presence of aortocoronary bypass graft: Secondary | ICD-10-CM

## 2016-09-20 DIAGNOSIS — Z881 Allergy status to other antibiotic agents status: Secondary | ICD-10-CM

## 2016-09-20 DIAGNOSIS — I350 Nonrheumatic aortic (valve) stenosis: Principal | ICD-10-CM | POA: Diagnosis present

## 2016-09-20 DIAGNOSIS — I4891 Unspecified atrial fibrillation: Secondary | ICD-10-CM

## 2016-09-20 DIAGNOSIS — Z888 Allergy status to other drugs, medicaments and biological substances status: Secondary | ICD-10-CM | POA: Diagnosis not present

## 2016-09-20 DIAGNOSIS — E669 Obesity, unspecified: Secondary | ICD-10-CM | POA: Diagnosis present

## 2016-09-20 DIAGNOSIS — I110 Hypertensive heart disease with heart failure: Secondary | ICD-10-CM | POA: Diagnosis present

## 2016-09-20 DIAGNOSIS — I5032 Chronic diastolic (congestive) heart failure: Secondary | ICD-10-CM | POA: Diagnosis present

## 2016-09-20 DIAGNOSIS — Z952 Presence of prosthetic heart valve: Secondary | ICD-10-CM

## 2016-09-20 DIAGNOSIS — I459 Conduction disorder, unspecified: Secondary | ICD-10-CM | POA: Diagnosis not present

## 2016-09-20 DIAGNOSIS — R5381 Other malaise: Secondary | ICD-10-CM | POA: Diagnosis not present

## 2016-09-20 DIAGNOSIS — D62 Acute posthemorrhagic anemia: Secondary | ICD-10-CM | POA: Diagnosis not present

## 2016-09-20 DIAGNOSIS — N400 Enlarged prostate without lower urinary tract symptoms: Secondary | ICD-10-CM | POA: Diagnosis present

## 2016-09-20 DIAGNOSIS — I2510 Atherosclerotic heart disease of native coronary artery without angina pectoris: Secondary | ICD-10-CM | POA: Diagnosis present

## 2016-09-20 DIAGNOSIS — E119 Type 2 diabetes mellitus without complications: Secondary | ICD-10-CM | POA: Diagnosis present

## 2016-09-20 DIAGNOSIS — I4819 Other persistent atrial fibrillation: Secondary | ICD-10-CM

## 2016-09-20 DIAGNOSIS — I480 Paroxysmal atrial fibrillation: Secondary | ICD-10-CM | POA: Diagnosis not present

## 2016-09-20 HISTORY — PX: AORTIC VALVE REPLACEMENT: SHX41

## 2016-09-20 HISTORY — PX: CORONARY ARTERY BYPASS GRAFT: SHX141

## 2016-09-20 HISTORY — PX: TEE WITHOUT CARDIOVERSION: SHX5443

## 2016-09-20 LAB — POCT I-STAT 3, ART BLOOD GAS (G3+)
ACID-BASE DEFICIT: 2 mmol/L (ref 0.0–2.0)
ACID-BASE EXCESS: 5 mmol/L — AB (ref 0.0–2.0)
Acid-base deficit: 2 mmol/L (ref 0.0–2.0)
Acid-base deficit: 4 mmol/L — ABNORMAL HIGH (ref 0.0–2.0)
Acid-base deficit: 2 mmol/L (ref 0.0–2.0)
Acid-base deficit: 2 mmol/L (ref 0.0–2.0)
Acid-base deficit: 2 mmol/L (ref 0.0–2.0)
BICARBONATE: 31.1 mmol/L — AB (ref 20.0–28.0)
BICARBONATE: 24 mmol/L (ref 20.0–28.0)
BICARBONATE: 24.6 mmol/L (ref 20.0–28.0)
BICARBONATE: 24.5 mmol/L (ref 20.0–28.0)
BICARBONATE: 23.8 mmol/L (ref 20.0–28.0)
BICARBONATE: 23.7 mmol/L (ref 20.0–28.0)
Bicarbonate: 24.2 mmol/L (ref 20.0–28.0)
O2 SAT: 100 %
O2 SAT: 99 %
O2 SAT: 91 %
O2 SAT: 93 %
O2 Saturation: 94 %
O2 Saturation: 95 %
O2 Saturation: 96 %
PCO2 ART: 44.7 mmHg (ref 32.0–48.0)
PCO2 ART: 52.7 mmHg — AB (ref 32.0–48.0)
PCO2 ART: 43.1 mmHg (ref 32.0–48.0)
PH ART: 7.34 — AB (ref 7.350–7.450)
PH ART: 7.333 — AB (ref 7.350–7.450)
PO2 ART: 449 mmHg — AB (ref 83.0–108.0)
PO2 ART: 81 mmHg — AB (ref 83.0–108.0)
PO2 ART: 68 mmHg — AB (ref 83.0–108.0)
Patient temperature: 35.8
Patient temperature: 36.1
TCO2: 33 mmol/L — ABNORMAL HIGH (ref 22–32)
TCO2: 25 mmol/L (ref 22–32)
TCO2: 26 mmol/L (ref 22–32)
TCO2: 26 mmol/L (ref 22–32)
TCO2: 26 mmol/L (ref 22–32)
TCO2: 25 mmol/L (ref 22–32)
TCO2: 25 mmol/L (ref 22–32)
pCO2 arterial: 55.7 mmHg — ABNORMAL HIGH (ref 32.0–48.0)
pCO2 arterial: 49.4 mmHg — ABNORMAL HIGH (ref 32.0–48.0)
pCO2 arterial: 44.5 mmHg (ref 32.0–48.0)
pCO2 arterial: 41.3 mmHg (ref 32.0–48.0)
pH, Arterial: 7.354 (ref 7.350–7.450)
pH, Arterial: 7.262 — ABNORMAL LOW (ref 7.350–7.450)
pH, Arterial: 7.305 — ABNORMAL LOW (ref 7.350–7.450)
pH, Arterial: 7.357 (ref 7.350–7.450)
pH, Arterial: 7.364 (ref 7.350–7.450)
pO2, Arterial: 134 mmHg — ABNORMAL HIGH (ref 83.0–108.0)
pO2, Arterial: 80 mmHg — ABNORMAL LOW (ref 83.0–108.0)
pO2, Arterial: 86 mmHg (ref 83.0–108.0)
pO2, Arterial: 62 mmHg — ABNORMAL LOW (ref 83.0–108.0)

## 2016-09-20 LAB — VAS US DOPPLER PRE CABG
LCCAPDIAS: 25 cm/s
LEFT ECA DIAS: -21 cm/s
LEFT VERTEBRAL DIAS: 8 cm/s
LICAPDIAS: -19 cm/s
Left CCA dist dias: -22 cm/s
Left CCA dist sys: -58 cm/s
Left CCA prox sys: 120 cm/s
Left ICA dist dias: -30 cm/s
Left ICA dist sys: -76 cm/s
Left ICA prox sys: -62 cm/s
RCCAPDIAS: 13 cm/s
RIGHT ECA DIAS: -18 cm/s
RIGHT VERTEBRAL DIAS: 14 cm/s
Right CCA prox sys: 85 cm/s
Right cca dist sys: 35 cm/s

## 2016-09-20 LAB — GLUCOSE, CAPILLARY
GLUCOSE-CAPILLARY: 109 mg/dL — AB (ref 65–99)
Glucose-Capillary: 120 mg/dL — ABNORMAL HIGH (ref 65–99)
Glucose-Capillary: 114 mg/dL — ABNORMAL HIGH (ref 65–99)
Glucose-Capillary: 116 mg/dL — ABNORMAL HIGH (ref 65–99)
Glucose-Capillary: 110 mg/dL — ABNORMAL HIGH (ref 65–99)

## 2016-09-20 LAB — POCT I-STAT, CHEM 8
BUN: 13 mg/dL (ref 6–20)
BUN: 12 mg/dL (ref 6–20)
BUN: 13 mg/dL (ref 6–20)
BUN: 13 mg/dL (ref 6–20)
BUN: 12 mg/dL (ref 6–20)
BUN: 12 mg/dL (ref 6–20)
BUN: 12 mg/dL (ref 6–20)
BUN: 11 mg/dL (ref 6–20)
CALCIUM ION: 1.26 mmol/L (ref 1.15–1.40)
CALCIUM ION: 1.28 mmol/L (ref 1.15–1.40)
CALCIUM ION: 1.12 mmol/L — AB (ref 1.15–1.40)
CALCIUM ION: 1.13 mmol/L — AB (ref 1.15–1.40)
CALCIUM ION: 1.17 mmol/L (ref 1.15–1.40)
CALCIUM ION: 1.26 mmol/L (ref 1.15–1.40)
CHLORIDE: 105 mmol/L (ref 101–111)
CHLORIDE: 105 mmol/L (ref 101–111)
CHLORIDE: 107 mmol/L (ref 101–111)
CREATININE: 0.8 mg/dL (ref 0.61–1.24)
CREATININE: 0.9 mg/dL (ref 0.61–1.24)
CREATININE: 0.8 mg/dL (ref 0.61–1.24)
CREATININE: 0.8 mg/dL (ref 0.61–1.24)
CREATININE: 0.9 mg/dL (ref 0.61–1.24)
Calcium, Ion: 1.13 mmol/L — ABNORMAL LOW (ref 1.15–1.40)
Calcium, Ion: 1.1 mmol/L — ABNORMAL LOW (ref 1.15–1.40)
Chloride: 105 mmol/L (ref 101–111)
Chloride: 103 mmol/L (ref 101–111)
Chloride: 103 mmol/L (ref 101–111)
Chloride: 105 mmol/L (ref 101–111)
Chloride: 104 mmol/L (ref 101–111)
Creatinine, Ser: 0.9 mg/dL (ref 0.61–1.24)
Creatinine, Ser: 0.8 mg/dL (ref 0.61–1.24)
Creatinine, Ser: 0.8 mg/dL (ref 0.61–1.24)
GLUCOSE: 120 mg/dL — AB (ref 65–99)
GLUCOSE: 169 mg/dL — AB (ref 65–99)
GLUCOSE: 187 mg/dL — AB (ref 65–99)
GLUCOSE: 137 mg/dL — AB (ref 65–99)
Glucose, Bld: 125 mg/dL — ABNORMAL HIGH (ref 65–99)
Glucose, Bld: 120 mg/dL — ABNORMAL HIGH (ref 65–99)
Glucose, Bld: 153 mg/dL — ABNORMAL HIGH (ref 65–99)
Glucose, Bld: 177 mg/dL — ABNORMAL HIGH (ref 65–99)
HCT: 40 % (ref 39.0–52.0)
HCT: 31 % — ABNORMAL LOW (ref 39.0–52.0)
HCT: 29 % — ABNORMAL LOW (ref 39.0–52.0)
HCT: 30 % — ABNORMAL LOW (ref 39.0–52.0)
HCT: 31 % — ABNORMAL LOW (ref 39.0–52.0)
HCT: 32 % — ABNORMAL LOW (ref 39.0–52.0)
HCT: 34 % — ABNORMAL LOW (ref 39.0–52.0)
HEMATOCRIT: 37 % — AB (ref 39.0–52.0)
HEMOGLOBIN: 9.9 g/dL — AB (ref 13.0–17.0)
HEMOGLOBIN: 10.2 g/dL — AB (ref 13.0–17.0)
HEMOGLOBIN: 10.5 g/dL — AB (ref 13.0–17.0)
Hemoglobin: 13.6 g/dL (ref 13.0–17.0)
Hemoglobin: 12.6 g/dL — ABNORMAL LOW (ref 13.0–17.0)
Hemoglobin: 10.5 g/dL — ABNORMAL LOW (ref 13.0–17.0)
Hemoglobin: 10.9 g/dL — ABNORMAL LOW (ref 13.0–17.0)
Hemoglobin: 11.6 g/dL — ABNORMAL LOW (ref 13.0–17.0)
POTASSIUM: 4 mmol/L (ref 3.5–5.1)
POTASSIUM: 4.6 mmol/L (ref 3.5–5.1)
Potassium: 4 mmol/L (ref 3.5–5.1)
Potassium: 4.3 mmol/L (ref 3.5–5.1)
Potassium: 4.6 mmol/L (ref 3.5–5.1)
Potassium: 5.7 mmol/L — ABNORMAL HIGH (ref 3.5–5.1)
Potassium: 5.4 mmol/L — ABNORMAL HIGH (ref 3.5–5.1)
Potassium: 4.9 mmol/L (ref 3.5–5.1)
SODIUM: 140 mmol/L (ref 135–145)
SODIUM: 138 mmol/L (ref 135–145)
SODIUM: 140 mmol/L (ref 135–145)
Sodium: 141 mmol/L (ref 135–145)
Sodium: 141 mmol/L (ref 135–145)
Sodium: 135 mmol/L (ref 135–145)
Sodium: 137 mmol/L (ref 135–145)
Sodium: 136 mmol/L (ref 135–145)
TCO2: 26 mmol/L (ref 22–32)
TCO2: 25 mmol/L (ref 22–32)
TCO2: 29 mmol/L (ref 22–32)
TCO2: 26 mmol/L (ref 22–32)
TCO2: 25 mmol/L (ref 22–32)
TCO2: 23 mmol/L (ref 22–32)
TCO2: 24 mmol/L (ref 22–32)
TCO2: 24 mmol/L (ref 22–32)

## 2016-09-20 LAB — CBC
HCT: 38.9 % — ABNORMAL LOW (ref 39.0–52.0)
HEMATOCRIT: 38.4 % — AB (ref 39.0–52.0)
Hemoglobin: 13 g/dL (ref 13.0–17.0)
Hemoglobin: 13 g/dL (ref 13.0–17.0)
MCH: 30.6 pg (ref 26.0–34.0)
MCH: 30.9 pg (ref 26.0–34.0)
MCHC: 33.4 g/dL (ref 30.0–36.0)
MCHC: 33.9 g/dL (ref 30.0–36.0)
MCV: 91.5 fL (ref 78.0–100.0)
MCV: 91.2 fL (ref 78.0–100.0)
Platelets: 147 10*3/uL — ABNORMAL LOW (ref 150–400)
Platelets: 155 10*3/uL (ref 150–400)
RBC: 4.25 MIL/uL (ref 4.22–5.81)
RBC: 4.21 MIL/uL — AB (ref 4.22–5.81)
RDW: 13 % (ref 11.5–15.5)
RDW: 13 % (ref 11.5–15.5)
WBC: 19.8 10*3/uL — AB (ref 4.0–10.5)
WBC: 15.2 10*3/uL — ABNORMAL HIGH (ref 4.0–10.5)

## 2016-09-20 LAB — CREATININE, SERUM: CREATININE: 0.97 mg/dL (ref 0.61–1.24)

## 2016-09-20 LAB — HEMOGLOBIN AND HEMATOCRIT, BLOOD
HEMATOCRIT: 33.4 % — AB (ref 39.0–52.0)
HEMOGLOBIN: 11.3 g/dL — AB (ref 13.0–17.0)

## 2016-09-20 LAB — PROTIME-INR
INR: 1.48
Prothrombin Time: 17.8 seconds — ABNORMAL HIGH (ref 11.4–15.2)

## 2016-09-20 LAB — APTT: APTT: 37 s — AB (ref 24–36)

## 2016-09-20 LAB — CK: Total CK: 335 U/L (ref 49–397)

## 2016-09-20 LAB — PLATELET COUNT: PLATELETS: 151 10*3/uL (ref 150–400)

## 2016-09-20 LAB — MAGNESIUM: MAGNESIUM: 2.9 mg/dL — AB (ref 1.7–2.4)

## 2016-09-20 SURGERY — REPLACEMENT, AORTIC VALVE, OPEN
Anesthesia: General | Site: Chest

## 2016-09-20 MED ORDER — SODIUM CHLORIDE 0.9% FLUSH: 3.0000 mL | INTRAVENOUS | Status: DC | PRN

## 2016-09-20 MED ORDER — ACETAMINOPHEN 500 MG PO TABS
1000.0000 mg | ORAL_TABLET | Freq: Four times a day (QID) | ORAL | Status: DC
  Administered 2016-09-21 – 2016-09-25 (×15): 1000 mg via ORAL
  Filled 2016-09-20 (×16): qty 2

## 2016-09-20 MED ORDER — ACETAMINOPHEN 160 MG/5ML PO SOLN: 1000.0000 mg | Freq: Four times a day (QID) | ORAL | Status: DC

## 2016-09-20 MED ORDER — METOPROLOL TARTRATE 12.5 MG HALF TABLET: 12.5000 mg | ORAL_TABLET | Freq: Two times a day (BID) | ORAL | Status: DC

## 2016-09-20 MED ORDER — ROCURONIUM BROMIDE 10 MG/ML (PF) SYRINGE
PREFILLED_SYRINGE | INTRAVENOUS | Status: DC | PRN
  Administered 2016-09-20: 50 mg via INTRAVENOUS
  Administered 2016-09-20: 40 mg via INTRAVENOUS
  Administered 2016-09-20: 50 mg via INTRAVENOUS
  Administered 2016-09-20: 60 mg via INTRAVENOUS
  Administered 2016-09-20: 50 mg via INTRAVENOUS

## 2016-09-20 MED ORDER — CHLORHEXIDINE GLUCONATE 0.12% ORAL RINSE (MEDLINE KIT)
15.0000 mL | Freq: Two times a day (BID) | OROMUCOSAL | Status: DC
  Administered 2016-09-20: 15 mL via OROMUCOSAL

## 2016-09-20 MED ORDER — MAGNESIUM SULFATE 4 GM/100ML IV SOLN
4.0000 g | Freq: Once | INTRAVENOUS | Status: AC
  Administered 2016-09-20: 4 g via INTRAVENOUS

## 2016-09-20 MED ORDER — ASPIRIN EC 325 MG PO TBEC
325.0000 mg | DELAYED_RELEASE_TABLET | Freq: Every day | ORAL | Status: DC
  Administered 2016-09-21 – 2016-09-24 (×4): 325 mg via ORAL
  Filled 2016-09-20 (×4): qty 1

## 2016-09-20 MED ORDER — CHLORHEXIDINE GLUCONATE 0.12 % MT SOLN
15.0000 mL | Freq: Once | OROMUCOSAL | Status: DC
  Filled 2016-09-20: qty 15

## 2016-09-20 MED ORDER — ACETAMINOPHEN 160 MG/5ML PO SOLN: 650.0000 mg | Freq: Once | ORAL | Status: AC

## 2016-09-20 MED ORDER — HEPARIN SODIUM (PORCINE) 1000 UNIT/ML IJ SOLN
INTRAMUSCULAR | Status: AC
  Filled 2016-09-20: qty 1

## 2016-09-20 MED ORDER — INSULIN REGULAR BOLUS VIA INFUSION
0.0000 [IU] | Freq: Three times a day (TID) | INTRAVENOUS | Status: DC
  Filled 2016-09-20: qty 10

## 2016-09-20 MED ORDER — THROMBIN 5000 UNITS EX SOLR
OROMUCOSAL | Status: DC | PRN
  Administered 2016-09-20 (×3): 4 mL via TOPICAL

## 2016-09-20 MED ORDER — THROMBIN 20000 UNITS EX SOLR
CUTANEOUS | Status: AC
  Filled 2016-09-20: qty 20000

## 2016-09-20 MED ORDER — CHLORHEXIDINE GLUCONATE 0.12 % MT SOLN
15.0000 mL | OROMUCOSAL | Status: AC
  Administered 2016-09-20: 15 mL via OROMUCOSAL

## 2016-09-20 MED ORDER — ALBUMIN HUMAN 5 % IV SOLN
250.0000 mL | INTRAVENOUS | Status: AC | PRN
  Administered 2016-09-20 – 2016-09-21 (×2): 250 mL via INTRAVENOUS

## 2016-09-20 MED ORDER — POTASSIUM CHLORIDE 10 MEQ/50ML IV SOLN: 10.0000 meq | INTRAVENOUS | Status: AC

## 2016-09-20 MED ORDER — SODIUM CHLORIDE 0.9 % IV SOLN
0.0000 ug/kg/h | INTRAVENOUS | Status: DC
  Administered 2016-09-21: 0.1 ug/kg/h via INTRAVENOUS
  Filled 2016-09-20 (×2): qty 2

## 2016-09-20 MED ORDER — MIDAZOLAM HCL 2 MG/2ML IJ SOLN: 2.0000 mg | INTRAMUSCULAR | Status: DC | PRN

## 2016-09-20 MED ORDER — FENTANYL CITRATE (PF) 250 MCG/5ML IJ SOLN
INTRAMUSCULAR | Status: DC | PRN
Start: 2016-09-20 — End: 2016-09-20
  Administered 2016-09-20: 100 ug via INTRAVENOUS
  Administered 2016-09-20 (×2): 50 ug via INTRAVENOUS
  Administered 2016-09-20: 100 ug via INTRAVENOUS
  Administered 2016-09-20: 50 ug via INTRAVENOUS
  Administered 2016-09-20 (×2): 150 ug via INTRAVENOUS
  Administered 2016-09-20 (×4): 100 ug via INTRAVENOUS
  Administered 2016-09-20 (×2): 50 ug via INTRAVENOUS
  Administered 2016-09-20: 200 ug via INTRAVENOUS
  Administered 2016-09-20: 50 ug via INTRAVENOUS
  Administered 2016-09-20: 100 ug via INTRAVENOUS

## 2016-09-20 MED ORDER — ONDANSETRON HCL 4 MG/2ML IJ SOLN
4.0000 mg | Freq: Four times a day (QID) | INTRAMUSCULAR | Status: DC | PRN
  Administered 2016-09-20 – 2016-09-21 (×2): 4 mg via INTRAVENOUS
  Filled 2016-09-20 (×2): qty 2

## 2016-09-20 MED ORDER — FAMOTIDINE IN NACL 20-0.9 MG/50ML-% IV SOLN
20.0000 mg | Freq: Two times a day (BID) | INTRAVENOUS | Status: AC
  Administered 2016-09-20: 20 mg via INTRAVENOUS

## 2016-09-20 MED ORDER — NITROGLYCERIN IN D5W 200-5 MCG/ML-% IV SOLN
0.0000 ug/min | INTRAVENOUS | Status: DC
Start: 2016-09-20 — End: 2016-09-22

## 2016-09-20 MED ORDER — PROTAMINE SULFATE 10 MG/ML IV SOLN
INTRAVENOUS | Status: DC | PRN
  Administered 2016-09-20 (×3): 50 mg via INTRAVENOUS
  Administered 2016-09-20: 40 mg via INTRAVENOUS
  Administered 2016-09-20: 50 mg via INTRAVENOUS
  Administered 2016-09-20: 40 mg via INTRAVENOUS
  Administered 2016-09-20: 50 mg via INTRAVENOUS
  Administered 2016-09-20: 30 mg via INTRAVENOUS

## 2016-09-20 MED ORDER — CHLORHEXIDINE GLUCONATE 4 % EX LIQD: 30.0000 mL | CUTANEOUS | Status: DC

## 2016-09-20 MED ORDER — SODIUM BICARBONATE 8.4 % IV SOLN
50.0000 meq | Freq: Once | INTRAVENOUS | Status: AC
  Administered 2016-09-20: 50 meq via INTRAVENOUS

## 2016-09-20 MED ORDER — LIDOCAINE 2% (20 MG/ML) 5 ML SYRINGE
INTRAMUSCULAR | Status: AC
  Filled 2016-09-20: qty 5

## 2016-09-20 MED ORDER — LACTATED RINGERS IV SOLN: 500.0000 mL | Freq: Once | INTRAVENOUS | Status: DC | PRN

## 2016-09-20 MED ORDER — SODIUM CHLORIDE 0.9% FLUSH: 10.0000 mL | INTRAVENOUS | Status: DC | PRN

## 2016-09-20 MED ORDER — FENTANYL CITRATE (PF) 250 MCG/5ML IJ SOLN
INTRAMUSCULAR | Status: AC
  Filled 2016-09-20: qty 5

## 2016-09-20 MED ORDER — ROCURONIUM BROMIDE 10 MG/ML (PF) SYRINGE
PREFILLED_SYRINGE | INTRAVENOUS | Status: AC
  Filled 2016-09-20: qty 5

## 2016-09-20 MED ORDER — LACTATED RINGERS IV SOLN
INTRAVENOUS | Status: DC | PRN
  Administered 2016-09-20: 07:00:00 via INTRAVENOUS

## 2016-09-20 MED ORDER — LACTATED RINGERS IV SOLN
INTRAVENOUS | Status: DC | PRN
  Administered 2016-09-20 (×2): via INTRAVENOUS

## 2016-09-20 MED ORDER — ASPIRIN 81 MG PO CHEW: 324.0000 mg | CHEWABLE_TABLET | Freq: Every day | ORAL | Status: DC

## 2016-09-20 MED ORDER — MAGNESIUM SULFATE 4 GM/100ML IV SOLN
INTRAVENOUS | Status: AC
  Filled 2016-09-20: qty 100

## 2016-09-20 MED ORDER — BISACODYL 10 MG RE SUPP: 10.0000 mg | Freq: Every day | RECTAL | Status: DC

## 2016-09-20 MED ORDER — SODIUM CHLORIDE 0.9 % IV SOLN: 250.0000 mL | INTRAVENOUS | Status: DC

## 2016-09-20 MED ORDER — SODIUM CHLORIDE 0.9 % IV SOLN: INTRAVENOUS | Status: DC

## 2016-09-20 MED ORDER — MIDAZOLAM HCL 5 MG/5ML IJ SOLN
INTRAMUSCULAR | Status: DC | PRN
  Administered 2016-09-20: 2 mg via INTRAVENOUS
  Administered 2016-09-20: 3 mg via INTRAVENOUS
  Administered 2016-09-20 (×2): 2 mg via INTRAVENOUS
  Administered 2016-09-20: 3 mg via INTRAVENOUS

## 2016-09-20 MED ORDER — LACTATED RINGERS IV SOLN: INTRAVENOUS | Status: DC

## 2016-09-20 MED ORDER — MORPHINE SULFATE (PF) 2 MG/ML IV SOLN: 1.0000 mg | INTRAVENOUS | Status: DC | PRN

## 2016-09-20 MED ORDER — SODIUM CHLORIDE 0.9 % IV SOLN
INTRAVENOUS | Status: DC
  Administered 2016-09-20: 18:00:00 via INTRAVENOUS
  Filled 2016-09-20 (×2): qty 1

## 2016-09-20 MED ORDER — ACETAMINOPHEN 650 MG RE SUPP
650.0000 mg | Freq: Once | RECTAL | Status: AC
  Administered 2016-09-20: 650 mg via RECTAL

## 2016-09-20 MED ORDER — LACTATED RINGERS IV SOLN
INTRAVENOUS | Status: DC | PRN
  Administered 2016-09-20 (×2): via INTRAVENOUS

## 2016-09-20 MED ORDER — PROPOFOL 10 MG/ML IV BOLUS
INTRAVENOUS | Status: DC | PRN
  Administered 2016-09-20: 120 mg via INTRAVENOUS

## 2016-09-20 MED ORDER — DEXTROSE 5 % IV SOLN
1.5000 g | Freq: Two times a day (BID) | INTRAVENOUS | Status: AC
  Administered 2016-09-20 – 2016-09-22 (×4): 1.5 g via INTRAVENOUS
  Filled 2016-09-20 (×4): qty 1.5

## 2016-09-20 MED ORDER — TRANEXAMIC ACID 1000 MG/10ML IV SOLN
1.5000 mg/kg/h | INTRAVENOUS | Status: DC
  Filled 2016-09-20: qty 25

## 2016-09-20 MED ORDER — MIDAZOLAM HCL 10 MG/2ML IJ SOLN
INTRAMUSCULAR | Status: AC
  Filled 2016-09-20: qty 2

## 2016-09-20 MED ORDER — METOPROLOL TARTRATE 5 MG/5ML IV SOLN: 2.5000 mg | INTRAVENOUS | Status: DC | PRN

## 2016-09-20 MED ORDER — SODIUM CHLORIDE 0.9 % IV SOLN
INTRAVENOUS | Status: DC | PRN
Start: 2016-09-20 — End: 2016-09-20
  Administered 2016-09-20: 14:00:00 via INTRAVENOUS

## 2016-09-20 MED ORDER — METOPROLOL TARTRATE 25 MG/10 ML ORAL SUSPENSION: 12.5000 mg | Freq: Two times a day (BID) | ORAL | Status: DC

## 2016-09-20 MED ORDER — SODIUM CHLORIDE 0.45 % IV SOLN
INTRAVENOUS | Status: DC | PRN
  Administered 2016-09-20: 14:00:00 via INTRAVENOUS

## 2016-09-20 MED ORDER — PROTAMINE SULFATE 10 MG/ML IV SOLN
INTRAVENOUS | Status: AC
  Filled 2016-09-20: qty 10

## 2016-09-20 MED ORDER — SODIUM CHLORIDE 0.9% FLUSH
10.0000 mL | Freq: Two times a day (BID) | INTRAVENOUS | Status: DC
  Administered 2016-09-20 – 2016-09-23 (×5): 10 mL

## 2016-09-20 MED ORDER — ROCURONIUM BROMIDE 10 MG/ML (PF) SYRINGE
PREFILLED_SYRINGE | INTRAVENOUS | Status: AC
  Filled 2016-09-20: qty 10

## 2016-09-20 MED ORDER — MIDAZOLAM HCL 2 MG/2ML IJ SOLN
INTRAMUSCULAR | Status: AC
  Filled 2016-09-20: qty 2

## 2016-09-20 MED ORDER — PROTAMINE SULFATE 10 MG/ML IV SOLN
INTRAVENOUS | Status: AC
  Filled 2016-09-20: qty 25

## 2016-09-20 MED ORDER — HEMOSTATIC AGENTS (NO CHARGE) OPTIME
TOPICAL | Status: DC | PRN
  Administered 2016-09-20: 1 via TOPICAL

## 2016-09-20 MED ORDER — ORAL CARE MOUTH RINSE
15.0000 mL | Freq: Four times a day (QID) | OROMUCOSAL | Status: DC
  Administered 2016-09-21 (×2): 15 mL via OROMUCOSAL

## 2016-09-20 MED ORDER — TRAMADOL HCL 50 MG PO TABS
50.0000 mg | ORAL_TABLET | ORAL | Status: DC | PRN
  Administered 2016-09-21: 50 mg via ORAL
  Filled 2016-09-20: qty 1

## 2016-09-20 MED ORDER — HEPARIN SODIUM (PORCINE) 1000 UNIT/ML IJ SOLN
INTRAMUSCULAR | Status: DC | PRN
  Administered 2016-09-20: 37000 [IU] via INTRAVENOUS

## 2016-09-20 MED ORDER — MORPHINE SULFATE (PF) 4 MG/ML IV SOLN
2.0000 mg | INTRAVENOUS | Status: DC | PRN
  Administered 2016-09-21 – 2016-09-22 (×6): 4 mg via INTRAVENOUS
  Filled 2016-09-20 (×7): qty 1

## 2016-09-20 MED ORDER — SODIUM CHLORIDE 0.9 % IV SOLN
0.0000 ug/min | INTRAVENOUS | Status: DC
  Administered 2016-09-21: 20 ug/min via INTRAVENOUS
  Filled 2016-09-20: qty 2

## 2016-09-20 MED ORDER — MORPHINE SULFATE (PF) 2 MG/ML IV SOLN: 2.0000 mg | INTRAVENOUS | Status: DC | PRN

## 2016-09-20 MED ORDER — CHLORHEXIDINE GLUCONATE CLOTH 2 % EX PADS
6.0000 | MEDICATED_PAD | Freq: Every day | CUTANEOUS | Status: DC
  Administered 2016-09-20: 6 via TOPICAL

## 2016-09-20 MED ORDER — PANTOPRAZOLE SODIUM 40 MG PO TBEC
40.0000 mg | DELAYED_RELEASE_TABLET | Freq: Every day | ORAL | Status: DC
  Administered 2016-09-22 – 2016-09-24 (×3): 40 mg via ORAL
  Filled 2016-09-20 (×3): qty 1

## 2016-09-20 MED ORDER — 0.9 % SODIUM CHLORIDE (POUR BTL) OPTIME
TOPICAL | Status: DC | PRN
  Administered 2016-09-20: 5000 mL
  Administered 2016-09-20: 1000 mL

## 2016-09-20 MED ORDER — PROPOFOL 10 MG/ML IV BOLUS
INTRAVENOUS | Status: AC
  Filled 2016-09-20: qty 20

## 2016-09-20 MED ORDER — MORPHINE SULFATE (PF) 4 MG/ML IV SOLN
1.0000 mg | INTRAVENOUS | Status: AC | PRN
  Administered 2016-09-20: 2 mg via INTRAVENOUS

## 2016-09-20 MED ORDER — SODIUM CHLORIDE 0.9% FLUSH
3.0000 mL | Freq: Two times a day (BID) | INTRAVENOUS | Status: DC
  Administered 2016-09-21 – 2016-09-24 (×7): 3 mL via INTRAVENOUS

## 2016-09-20 MED ORDER — OXYCODONE HCL 5 MG PO TABS
5.0000 mg | ORAL_TABLET | ORAL | Status: DC | PRN
  Administered 2016-09-21 – 2016-09-22 (×5): 10 mg via ORAL
  Filled 2016-09-20 (×5): qty 2

## 2016-09-20 MED ORDER — SODIUM CHLORIDE 0.9 % IJ SOLN
INTRAMUSCULAR | Status: AC
  Filled 2016-09-20: qty 10

## 2016-09-20 MED ORDER — ARTIFICIAL TEARS OPHTHALMIC OINT
TOPICAL_OINTMENT | OPHTHALMIC | Status: DC | PRN
  Administered 2016-09-20: 1 via OPHTHALMIC

## 2016-09-20 MED ORDER — ALBUMIN HUMAN 5 % IV SOLN
INTRAVENOUS | Status: DC | PRN
  Administered 2016-09-20 (×3): via INTRAVENOUS

## 2016-09-20 MED ORDER — LACTATED RINGERS IV SOLN
INTRAVENOUS | Status: DC
  Administered 2016-09-20: 19:00:00 via INTRAVENOUS

## 2016-09-20 MED ORDER — VANCOMYCIN HCL IN DEXTROSE 1-5 GM/200ML-% IV SOLN
1000.0000 mg | Freq: Once | INTRAVENOUS | Status: AC
  Administered 2016-09-20: 1000 mg via INTRAVENOUS
  Filled 2016-09-20: qty 200

## 2016-09-20 MED ORDER — FENTANYL CITRATE (PF) 250 MCG/5ML IJ SOLN
INTRAMUSCULAR | Status: AC
  Filled 2016-09-20: qty 25

## 2016-09-20 MED ORDER — BISACODYL 5 MG PO TBEC
10.0000 mg | DELAYED_RELEASE_TABLET | Freq: Every day | ORAL | Status: DC
  Administered 2016-09-21 – 2016-09-23 (×3): 10 mg via ORAL
  Filled 2016-09-20 (×3): qty 2

## 2016-09-20 MED ORDER — DOCUSATE SODIUM 100 MG PO CAPS
200.0000 mg | ORAL_CAPSULE | Freq: Every day | ORAL | Status: DC
  Administered 2016-09-21 – 2016-09-23 (×3): 200 mg via ORAL
  Filled 2016-09-20 (×3): qty 2

## 2016-09-20 SURGICAL SUPPLY — 138 items
ADAPTER CARDIO PERF ANTE/RETRO (ADAPTER) ×4 IMPLANT
ADPR PRFSN 84XANTGRD RTRGD (ADAPTER) ×2
BAG DECANTER FOR FLEXI CONT (MISCELLANEOUS) ×4 IMPLANT
BANDAGE ACE 4X5 VEL STRL LF (GAUZE/BANDAGES/DRESSINGS) ×2 IMPLANT
BANDAGE ACE 6X5 VEL STRL LF (GAUZE/BANDAGES/DRESSINGS) ×2 IMPLANT
BANDAGE ELASTIC 4 VELCRO ST LF (GAUZE/BANDAGES/DRESSINGS) ×2 IMPLANT
BANDAGE ELASTIC 6 VELCRO ST LF (GAUZE/BANDAGES/DRESSINGS) ×2 IMPLANT
BASKET HEART  (ORDER IN 25'S) (MISCELLANEOUS) ×1
BASKET HEART (ORDER IN 25'S) (MISCELLANEOUS) ×1
BASKET HEART (ORDER IN 25S) (MISCELLANEOUS) ×2 IMPLANT
BLADE NDL 3 SS STRL (BLADE) IMPLANT
BLADE NEEDLE 3 SS STRL (BLADE) ×3 IMPLANT
BLADE NEEDLE 3MM SS STRL (BLADE) ×1
BLADE STERNUM SYSTEM 6 (BLADE) ×4 IMPLANT
BLADE SURG 11 STRL SS (BLADE) ×2 IMPLANT
BLADE SURG 15 STRL LF DISP TIS (BLADE) ×2 IMPLANT
BLADE SURG 15 STRL SS (BLADE) ×4
BNDG GAUZE ELAST 4 BULKY (GAUZE/BANDAGES/DRESSINGS) ×2 IMPLANT
CANISTER SUCT 3000ML PPV (MISCELLANEOUS) ×4 IMPLANT
CANNULA ARTERIAL NVNT 3/8 22FR (MISCELLANEOUS) ×2 IMPLANT
CANNULA GUNDRY RCSP 15FR (MISCELLANEOUS) ×6 IMPLANT
CATH HEART VENT LEFT (CATHETERS) IMPLANT
CATH ROBINSON RED A/P 18FR (CATHETERS) ×12 IMPLANT
CATH THORACIC 28FR (CATHETERS) ×4 IMPLANT
CATH THORACIC 36FR (CATHETERS) ×4 IMPLANT
CATH THORACIC 36FR RT ANG (CATHETERS) ×4 IMPLANT
CLIP VESOCCLUDE MED 24/CT (CLIP) IMPLANT
CLIP VESOCCLUDE SM WIDE 24/CT (CLIP) ×2 IMPLANT
CONT SPEC 4OZ CLIKSEAL STRL BL (MISCELLANEOUS) ×4 IMPLANT
COVER SURGICAL LIGHT HANDLE (MISCELLANEOUS) ×4 IMPLANT
CRADLE DONUT ADULT HEAD (MISCELLANEOUS) ×4 IMPLANT
DRAPE CARDIOVASCULAR INCISE (DRAPES) ×4
DRAPE SLUSH/WARMER DISC (DRAPES) ×4 IMPLANT
DRAPE SRG 135X102X78XABS (DRAPES) ×2 IMPLANT
DRSG COVADERM 4X10 (GAUZE/BANDAGES/DRESSINGS) ×2 IMPLANT
DRSG COVADERM 4X14 (GAUZE/BANDAGES/DRESSINGS) ×2 IMPLANT
ELECT CAUTERY BLADE 6.4 (BLADE) ×6 IMPLANT
ELECT REM PT RETURN 9FT ADLT (ELECTROSURGICAL) ×8
ELECTRODE REM PT RTRN 9FT ADLT (ELECTROSURGICAL) ×4 IMPLANT
FELT TEFLON 1X6 (MISCELLANEOUS) ×8 IMPLANT
GAUZE SPONGE 4X4 12PLY STRL (GAUZE/BANDAGES/DRESSINGS) ×4 IMPLANT
GAUZE SPONGE 4X4 12PLY STRL LF (GAUZE/BANDAGES/DRESSINGS) ×4 IMPLANT
GLOVE BIO SURGEON STRL SZ 6 (GLOVE) ×6 IMPLANT
GLOVE BIO SURGEON STRL SZ 6.5 (GLOVE) ×7 IMPLANT
GLOVE BIO SURGEON STRL SZ7 (GLOVE) IMPLANT
GLOVE BIO SURGEON STRL SZ7.5 (GLOVE) IMPLANT
GLOVE BIO SURGEONS STRL SZ 6.5 (GLOVE) ×7
GLOVE BIOGEL PI IND STRL 6 (GLOVE) IMPLANT
GLOVE BIOGEL PI IND STRL 6.5 (GLOVE) IMPLANT
GLOVE BIOGEL PI IND STRL 7.0 (GLOVE) IMPLANT
GLOVE BIOGEL PI IND STRL 8.5 (GLOVE) IMPLANT
GLOVE BIOGEL PI INDICATOR 6 (GLOVE)
GLOVE BIOGEL PI INDICATOR 6.5 (GLOVE)
GLOVE BIOGEL PI INDICATOR 7.0 (GLOVE)
GLOVE BIOGEL PI INDICATOR 8.5 (GLOVE) ×2
GLOVE EUDERMIC 7 POWDERFREE (GLOVE) ×8 IMPLANT
GLOVE ORTHO TXT STRL SZ7.5 (GLOVE) ×4 IMPLANT
GOWN STRL REUS W/ TWL LRG LVL3 (GOWN DISPOSABLE) ×8 IMPLANT
GOWN STRL REUS W/ TWL XL LVL3 (GOWN DISPOSABLE) ×2 IMPLANT
GOWN STRL REUS W/TWL LRG LVL3 (GOWN DISPOSABLE) ×40
GOWN STRL REUS W/TWL XL LVL3 (GOWN DISPOSABLE) ×4
HEART VENT LT CURVED (MISCELLANEOUS) ×4 IMPLANT
HEMOSTAT POWDER SURGIFOAM 1G (HEMOSTASIS) ×12 IMPLANT
HEMOSTAT SURGICEL 2X14 (HEMOSTASIS) ×4 IMPLANT
INSERT FOGARTY 61MM (MISCELLANEOUS) IMPLANT
INSERT FOGARTY XLG (MISCELLANEOUS) ×2 IMPLANT
KIT BASIN OR (CUSTOM PROCEDURE TRAY) ×4 IMPLANT
KIT CATH CPB BARTLE (MISCELLANEOUS) ×4 IMPLANT
KIT ROOM TURNOVER OR (KITS) ×4 IMPLANT
KIT SUCTION CATH 14FR (SUCTIONS) ×4 IMPLANT
KIT VASOVIEW HEMOPRO VH 3000 (KITS) ×4 IMPLANT
LINE EXTENSION DELIVERY (MISCELLANEOUS) ×2 IMPLANT
LINE VENT (MISCELLANEOUS) ×2 IMPLANT
NS IRRIG 1000ML POUR BTL (IV SOLUTION) ×24 IMPLANT
PACK OPEN HEART (CUSTOM PROCEDURE TRAY) ×4 IMPLANT
PAD ARMBOARD 7.5X6 YLW CONV (MISCELLANEOUS) ×8 IMPLANT
PAD ELECT DEFIB RADIOL ZOLL (MISCELLANEOUS) ×4 IMPLANT
PENCIL BUTTON HOLSTER BLD 10FT (ELECTRODE) ×4 IMPLANT
PUNCH AORTIC ROTATE 4.0MM (MISCELLANEOUS) IMPLANT
PUNCH AORTIC ROTATE 4.5MM 8IN (MISCELLANEOUS) ×4 IMPLANT
PUNCH AORTIC ROTATE 5MM 8IN (MISCELLANEOUS) IMPLANT
SET CARDIOPLEGIA MPS 5001102 (MISCELLANEOUS) ×2 IMPLANT
SOLUTION ANTI FOG 6CC (MISCELLANEOUS) ×2 IMPLANT
SPONGE INTESTINAL PEANUT (DISPOSABLE) IMPLANT
SPONGE LAP 18X18 X RAY DECT (DISPOSABLE) ×2 IMPLANT
SPONGE LAP 4X18 X RAY DECT (DISPOSABLE) ×2 IMPLANT
SUT BONE WAX W31G (SUTURE) ×4 IMPLANT
SUT ETHIBON 2 0 V 52N 30 (SUTURE) ×8 IMPLANT
SUT ETHIBOND 2 0 SH (SUTURE) ×16
SUT ETHIBOND 2 0 SH 36X2 (SUTURE) IMPLANT
SUT MNCRL AB 4-0 PS2 18 (SUTURE) ×2 IMPLANT
SUT PROLENE 3 0 SH DA (SUTURE) IMPLANT
SUT PROLENE 3 0 SH1 36 (SUTURE) ×6 IMPLANT
SUT PROLENE 4 0 RB 1 (SUTURE) ×20
SUT PROLENE 4 0 SH DA (SUTURE) IMPLANT
SUT PROLENE 4-0 RB1 .5 CRCL 36 (SUTURE) ×6 IMPLANT
SUT PROLENE 5 0 C 1 36 (SUTURE) ×4 IMPLANT
SUT PROLENE 6 0 C 1 30 (SUTURE) ×8 IMPLANT
SUT PROLENE 7 0 BV 1 (SUTURE) IMPLANT
SUT PROLENE 7 0 BV1 MDA (SUTURE) ×4 IMPLANT
SUT PROLENE 8 0 BV175 6 (SUTURE) ×4 IMPLANT
SUT SILK  1 MH (SUTURE) ×8
SUT SILK 1 MH (SUTURE) IMPLANT
SUT SILK 1 TIES 10X30 (SUTURE) ×2 IMPLANT
SUT SILK 2 0 SH CR/8 (SUTURE) ×6 IMPLANT
SUT SILK 2 0 TIES 10X30 (SUTURE) ×2 IMPLANT
SUT SILK 2 0 TIES 17X18 (SUTURE) ×4
SUT SILK 2-0 18XBRD TIE BLK (SUTURE) IMPLANT
SUT SILK 3 0 SH CR/8 (SUTURE) ×2 IMPLANT
SUT SILK 4 0 TIE 10X30 (SUTURE) ×4 IMPLANT
SUT STEEL 6MS V (SUTURE) IMPLANT
SUT STEEL STERNAL CCS#1 18IN (SUTURE) IMPLANT
SUT STEEL SZ 6 DBL 3X14 BALL (SUTURE) ×6 IMPLANT
SUT TEM PAC WIRE 2 0 SH (SUTURE) ×8 IMPLANT
SUT VIC AB 1 CTX 36 (SUTURE) ×8
SUT VIC AB 1 CTX36XBRD ANBCTR (SUTURE) ×4 IMPLANT
SUT VIC AB 2-0 CT1 27 (SUTURE) ×4
SUT VIC AB 2-0 CT1 TAPERPNT 27 (SUTURE) IMPLANT
SUT VIC AB 2-0 CTX 27 (SUTURE) IMPLANT
SUT VIC AB 3-0 SH 27 (SUTURE)
SUT VIC AB 3-0 SH 27X BRD (SUTURE) IMPLANT
SUT VIC AB 3-0 X1 27 (SUTURE) ×6 IMPLANT
SUT VICRYL 2-0 COATED 36IN (SUTURE) ×4 IMPLANT
SUT VICRYL 4-0 PS2 18IN ABS (SUTURE) IMPLANT
SUTURE E-PAK OPEN HEART (SUTURE) ×2 IMPLANT
SYSTEM SAHARA CHEST DRAIN ATS (WOUND CARE) ×4 IMPLANT
TAPE CLOTH SURG 4X10 WHT LF (GAUZE/BANDAGES/DRESSINGS) ×4 IMPLANT
TAPE PAPER 2X10 WHT MICROPORE (GAUZE/BANDAGES/DRESSINGS) ×2 IMPLANT
TOWEL GREEN STERILE (TOWEL DISPOSABLE) ×10 IMPLANT
TOWEL GREEN STERILE FF (TOWEL DISPOSABLE) ×6 IMPLANT
TOWEL OR 17X24 6PK STRL BLUE (TOWEL DISPOSABLE) ×4 IMPLANT
TOWEL OR 17X26 10 PK STRL BLUE (TOWEL DISPOSABLE) ×4 IMPLANT
TRAY FOLEY SILVER 16FR TEMP (SET/KITS/TRAYS/PACK) ×4 IMPLANT
TUBING INSUFFLATION (TUBING) ×4 IMPLANT
UNDERPAD 30X30 (UNDERPADS AND DIAPERS) ×4 IMPLANT
VALVE MAGNA EASE AORTIC 23MM (Prosthesis & Implant Heart) ×2 IMPLANT
VENT LEFT HEART 12002 (CATHETERS) ×4
WATER STERILE IRR 1000ML POUR (IV SOLUTION) ×8 IMPLANT

## 2016-09-20 NOTE — Anesthesia Preprocedure Evaluation (Addendum)
Anesthesia Evaluation  Patient identified by MRN, date of birth, ID band Patient awake    Reviewed: Allergy & Precautions, H&P , NPO status , Patient's Chart, lab work & pertinent test results  Airway Mallampati: II   Neck ROM: full    Dental   Pulmonary shortness of breath and with exertion, former smoker,    breath sounds clear to auscultation       Cardiovascular hypertension, + Valvular Problems/Murmurs AS  Rhythm:regular Rate:Normal     Neuro/Psych PSYCHIATRIC DISORDERS    GI/Hepatic GERD  Medicated and Controlled,  Endo/Other    Renal/GU      Musculoskeletal  (+) Arthritis ,   Abdominal   Peds  Hematology   Anesthesia Other Findings Left ventricle: The cavity size was normal. Wall thickness was   normal. Systolic function was normal. The estimated ejection   fraction was in the range of 55% to 60%. Wall motion was normal;   there were no regional wall motion abnormalities. There was   fusion of early and atrial contributions to ventricular filling. - Aortic valve: Valve mobility was severely restricted. There was   severe stenosis. Mean gradient (S): 46 mm Hg. Peak gradient (S):   76 mm Hg. Valve area (VTI): 0.88 cm^2. Valve area (Vmax): 0.82   cm^2. Valve area (Vmean): 0.81 cm^2.  Reproductive/Obstetrics                            Anesthesia Physical Anesthesia Plan  ASA: III  Anesthesia Plan: General   Post-op Pain Management:    Induction: Intravenous  PONV Risk Score and Plan: 3 and Ondansetron, Dexamethasone and Midazolam  Airway Management Planned: Oral ETT  Additional Equipment: Arterial line, CVP, TEE and Ultrasound Guidance Line Placement  Intra-op Plan:   Post-operative Plan: Post-operative intubation/ventilation  Informed Consent: I have reviewed the patients History and Physical, chart, labs and discussed the procedure including the risks, benefits and  alternatives for the proposed anesthesia with the patient or authorized representative who has indicated his/her understanding and acceptance.     Plan Discussed with: CRNA, Anesthesiologist and Surgeon  Anesthesia Plan Comments:         Anesthesia Quick Evaluation

## 2016-09-20 NOTE — Transfer of Care (Signed)
Immediate Anesthesia Transfer of Care Note  Patient: Joseph [name]  Procedure(s) Performed: Procedure(s): AORTIC VALVE REPLACEMENT (AVR) USING MAGNA EASE 23 MM PERICARDIAL TISSUE HEART VALVE MODEL 3300TFX (N/A) CORONARY ARTERY BYPASS GRAFTING (CABG)x 3 WITH ENDOSCOPIC HARVESTING OF RIGHT SAPHENOUS VEIN (N/A) TRANSESOPHAGEAL ECHOCARDIOGRAM (TEE) (N/A)  Patient Location: SICU  Anesthesia Type:General  Level of Consciousness: Patient remains intubated per anesthesia plan  Airway & Oxygen Therapy: Patient remains intubated per anesthesia plan and Patient placed on Ventilator (see vital sign flow sheet for setting)  Post-op Assessment: Report given to RN and Post -op Vital signs reviewed and stable  Post vital signs: Reviewed and stable  Last Vitals:  Vitals:   09/20/16 0555  BP: (!) 103/53  Pulse: 86  Resp: 20  Temp: 37 C  SpO2: 95%    Last Pain:  Vitals:   09/20/16 0555  TempSrc: Oral         Complications: No apparent anesthesia complications

## 2016-09-20 NOTE — Procedures (Signed)
Extubation Procedure Note  Patient Details:   Name: Joseph [name] DOB: 1940/10/28 MRN: [id number]   Airway Documentation:     Evaluation  O2 sats: stable throughout Complications: No apparent complications Patient did tolerate procedure well. Bilateral Breath Sounds: Clear   Yes   Patient was able to speak, positive cuff leak noted, no evidence of stridor, VC of 800, NIF of -60, vitals are stable and patient is resting comfortably. Patient was placed on 4LNC, RT will continue to monitor.   Heath Lark 09/20/2016, 6:36 PM

## 2016-09-20 NOTE — Interval H&P Note (Signed)
History and Physical Interval Note:  09/20/2016 6:47 AM  Joseph [name]  has presented today for surgery, with the diagnosis of SEVERE AS CAD  The various methods of treatment have been discussed with the patient and family. After consideration of risks, benefits and other options for treatment, the patient has consented to  Procedure(s): AORTIC VALVE REPLACEMENT (AVR) (N/A) CORONARY ARTERY BYPASS GRAFTING (CABG) (N/A) TRANSESOPHAGEAL ECHOCARDIOGRAM (TEE) (N/A) as a surgical intervention .  The patient's history has been reviewed, patient examined, no change in status, stable for surgery.  I have reviewed the patient's chart and labs.  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 I discussed this with Dr. 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 The patient has had no signs or symptoms of TIA or stroke. I don't think there is any reason not to proceed with surgery. Questions were answered to the patient's satisfaction.     Gaye Pollack

## 2016-09-20 NOTE — Anesthesia Procedure Notes (Signed)
Central Venous Catheter Insertion Performed by: Effie Berkshire, anesthesiologist Start/End8/31/2018 6:50 AM, 09/20/2016 7:00 AM Patient location: Pre-op. Preanesthetic checklist: patient identified, IV checked, site marked, risks and benefits discussed, surgical consent, monitors and equipment checked, pre-op evaluation, timeout performed and anesthesia consent Position: Trendelenburg Lidocaine 1% used for infiltration and patient sedated Hand hygiene performed , maximum sterile barriers used  and Seldinger technique used Catheter size: 9 Fr Total catheter length 10. Central line was placed.Sheath introducer Swan type:thermodilution PA Cath depth:50 Procedure performed using ultrasound guided technique. Ultrasound Notes:anatomy identified, needle tip was noted to be adjacent to the nerve/plexus identified, no ultrasound evidence of intravascular and/or intraneural injection and image(s) printed for medical record Attempts: 1 Following insertion, line sutured and dressing applied. Post procedure assessment: blood return through all ports, free fluid flow and no air  Patient tolerated the procedure well with no immediate complications.

## 2016-09-20 NOTE — Anesthesia Procedure Notes (Signed)
Central Venous Catheter Insertion Performed by: Effie Berkshire, anesthesiologist Start/End8/31/2018 6:50 AM, 09/20/2016 6:55 AM Patient location: Pre-op. Preanesthetic checklist: patient identified, IV checked, site marked, risks and benefits discussed, surgical consent, monitors and equipment checked, pre-op evaluation, timeout performed and anesthesia consent Hand hygiene performed  and maximum sterile barriers used  PA cath was placed.Swan type:thermodilution Procedure performed using ultrasound guided technique. Ultrasound Notes:anatomy identified, needle tip was noted to be adjacent to the nerve/plexus identified, no ultrasound evidence of intravascular and/or intraneural injection and image(s) printed for medical record Attempts: 1 Patient tolerated the procedure well with no immediate complications.

## 2016-09-20 NOTE — Progress Notes (Signed)
  Echocardiogram Echocardiogram Transesophageal has been performed.  Donata Clay 09/20/2016, 8:56 AM

## 2016-09-20 NOTE — Op Note (Signed)
CARDIOVASCULAR SURGERY OPERATIVE NOTE  09/20/2016  Surgeon:  Gaye Pollack, MD  First Assistant: Jadene Pierini,  PA-C   Preoperative Diagnosis:  Severe multi-vessel coronary artery disease and severe aortic stenosis.   Postoperative Diagnosis:  Same   Procedure:  1. Median Sternotomy 2. Extracorporeal circulation 3.   Coronary artery bypass grafting x 3   Left internal mammary graft to the LAD  SVG to diagonal  SVG to RCA  4.   Endoscopic vein harvest from the right leg  5.   Aortic valve replacement using a 23 mm Edwards Magna-Ease pericardial valve   Anesthesia:  General Endotracheal   Clinical History/Surgical Indication:  The patient is a 76 year old gentleman with hypertension, obesity, normal pressure hydrocephalus s/p VP shunt in 2016, arthritis, rupture of bilateral quadriceps tendons requiring surgical repair and aortic stenosis that was severe by echo in 10/2014 with a mean gradient of 43 mm Hg and a DI of 0.17. He is not that active due to his arthritis and quadriceps tendon surgeries and obesity but does water aerobics at [employer] He does have shortness of breath with exertion during longer walks. He underwent a recent echo on 08/12/2016 showing a mean gradient of 46 mm Hg with an LVEF of 55-60%. His cath shows a calcified 90% proximal to mid LAD stenosis at the takeoff of a moderate sized diagonal. The LCX has mild non-obstructive disease. The RCA is occluded in its mid-portion with faint filling of the distal vessel by collaterals from the LAD. He denies any chest pain or pressure, dizziness or syncope and peripheral edema.  He has stage D, severe, symptomatic aortic stenosis with NYHA class II symptoms of exertional fatigue and shortness of breath consistent with chronic diastolic heart failure. He is not that active due to his arthritis and prior leg surgeries but does  do water aerobics. He had severe AS by echo in 10/2014 and fortunately his LVEF is still normal. I think AVR is indicated to prevent progression of symptoms and LV deterioration.   In addition he has severe 2-vessel coronary artery disease with a 90% proximal to mid LAD stenosis and RCA occlusion as well as severe aortic stenosis. I agree that AVR and CABG using a bioprosthetic valve is the best option for this patient. He has a ventriculo-peritoneal shunt that appears to be to the right of the midline and should not cause any problems for sternotomy. I discussed the operative procedure with the patient and his wifeincluding alternatives, benefits and risks; including but not limited to bleeding, blood transfusion, infection, stroke, myocardial infarction, graft failure, heart block requiring a permanent pacemaker, organ dysfunction, and death. Joseph J Evansunderstands and agrees to proceed.     Preparation:  The patient was seen in the preoperative holding area and the correct patient, correct operation were confirmed with the patient after reviewing the medical record and catheterization. The consent was signed by me. Preoperative antibiotics were given. A pulmonary arterial line and radial arterial line were placed by the anesthesia team. The patient was taken back to the operating room and positioned supine on the operating room table. After being placed under general endotracheal anesthesia by the anesthesia team a foley catheter was placed. The neck, chest, abdomen, and both legs were prepped with betadine soap and solution and draped in the usual sterile manner. A surgical time-out was taken and the correct patient and operative procedure were confirmed with the nursing and anesthesia staff.   Cardiopulmonary Bypass:  A median  sternotomy was performed. The pericardium was opened in the midline. Right ventricular function appeared normal. The ascending aorta was of normal size and had no  palpable plaque. There were no contraindications to aortic cannulation or cross-clamping. The patient was fully systemically heparinized and the ACT was maintained > 400 sec. The proximal aortic arch was cannulated with a 38 F aortic cannula for arterial inflow. Venous cannulation was performed via the right atrial appendage using a two-staged venous cannula. An antegrade cardioplegia/vent cannula was inserted into the mid-ascending aorta. A retrograde cardioplegia cannula was inserted into the coronary sinus via the right atrium. A left ventricular vent was placed via the right superior pulmonary vein. Aortic occlusion was performed with a single cross-clamp. Systemic cooling to 32 degrees Centigrade and topical cooling of the heart with iced saline were used. Hyperkalemic antegrade cold blood cardioplegia was used to induce diastolic arrest and then antegrade and retrograde cold blood cardioplegia was  given at about 20 minute intervals throughout the period of arrest to maintain myocardial temperature at or below 10 degrees centigrade. A temperature probe was inserted into the interventricular septum and an insulating pad was placed in the pericardium.   Left internal mammary harvest:  The left side of the sternum was retracted using the Rultract retractor. The left internal mammary artery was harvested as a pedicle graft. All side branches were clipped. It was a medium-sized vessel of good quality with excellent blood flow. It was ligated distally and divided. It was sprayed with topical papaverine solution to prevent vasospasm.   Endoscopic vein harvest:  The right greater saphenous vein was harvested endoscopically through a 2 cm incision medial to the right knee. It was harvested from the upper thigh to below the knee. It was a medium-sized vein of good quality. The side branches were all ligated with 4-0 silk ties.    Coronary arteries:  The coronary arteries were examined.   LAD:   Diffusely diseased throughout the proximal and mid portions. Distal vessel was suitable for grafting. The diagonal was diffusely diseased but graftable  LCX:  No significant stenosis on cath  RCA:  Diffusely diseased. The PDA and Pl were not graftable due to diffuse calcific plaque. The RCA proximal to the PDA was graftable. 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   Grafts:  1. LIMA to the LAD: 1.75 mm. It was sewn end to side using 8-0 prolene continuous suture. 2. SVG to diagonal:  1.6 mm. It was sewn end to side using 7-0 prolene continuous suture. 3. SVG to RCA:  2.5 mm. It was sewn end to side using 7-0 prolene continuous suture.   The proximal vein graft anastomoses were performed to the mid-ascending aorta using continuous 6-0 prolene suture. Graft markers were placed around the proximal anastomoses.  Aortic Valve Replacement:   A transverse aortotomy was performed 1 cm above the take-off of the right coronary artery. The native valve was tricuspid with calcified leaflets and severe annular calcification. The ostia of the coronary arteries were in normal position. The native valve leaflets were excised and the annulus was decalcified with rongeurs. Care was taken to remove all particulate debris. The left ventricle was directly inspected for debris and then irrigated with ice saline solution. The annulus was sized and a size 23 mm [employer] Ease pericardial valve was chosen. The model number was [id number] and the serial number was [id number].  While the valve was being prepared 2-0 Ethibond pledgeted horizontal mattress sutures were placed  around the annulus with the pledgets in a sub-annular position. The sutures were placed through the sewing ring and the valve lowered into place. The sutures were tied sequentially. The valve seated nicely and the coronary ostia were not obstructed. The prosthetic valve leaflets moved normally and there was no sub-valvular obstruction. The  aortotomy was closed using 4-0 Prolene suture in 2 layers with felt strips to reinforce the closure.  Completion:  The patient was rewarmed to 37 degrees Centigrade. The clamp was removed from the LIMA pedicle and there was rapid warming of the septum and return of ventricular fibrillation. The crossclamp was removed with a time of 170 minutes. There was spontaneous return of sinus rhythm. The distal and proximal anastomoses were checked for hemostasis. The position of the grafts was satisfactory. Two temporary epicardial pacing wires were placed on the right atrium and two on the right ventricle. The patient was weaned from CPB without difficulty on no inotropes. TEE showed a normal functioning aortic valve prosthesis with no paravalvular leak or regurgitation through the valve. LVEF remained 45%, unchanged from preop. There was trivial MR. CPB time was 193 minutes. Cardiac output was 5 LPM. Heparin was fully reversed with protamine and the aortic and venous cannulas removed. Hemostasis was achieved. Mediastinal and left pleural drainage tubes were placed. The sternum was closed with double #6 stainless steel wires. The fascia was closed with continuous # 1 vicryl suture. The subcutaneous tissue was closed with 2-0 vicryl continuous suture. The skin was closed with 3-0 vicryl subcuticular suture. All sponge, needle, and instrument counts were reported correct at the end of the case. Dry sterile dressings were placed over the incisions and around the chest tubes which were connected to pleurevac suction. The patient was then transported to the surgical intensive care unit in critical but stable condition.

## 2016-09-20 NOTE — Anesthesia Procedure Notes (Signed)
Arterial Line Insertion Start/End8/31/2018 6:50 AM, 09/20/2016 7:07 AM Performed by: Neldon Newport, CRNA  Preanesthetic checklist: patient identified, IV checked, site marked and risks and benefits discussed Lidocaine 1% used for infiltration and patient sedated Left, radial was placed Catheter size: 20 G Hand hygiene performed  and maximum sterile barriers used   Attempts: 2 Procedure performed without using ultrasound guided technique. Following insertion, dressing applied and Biopatch. Post procedure assessment: normal  Patient tolerated the procedure well with no immediate complications.

## 2016-09-20 NOTE — Anesthesia Procedure Notes (Signed)
Procedure Name: Intubation Date/Time: 09/20/2016 7:37 AM Performed by: Neldon Newport Pre-anesthesia Checklist: Timeout performed, Patient being monitored, Suction available, Emergency Drugs available and Patient identified Patient Re-evaluated:Patient Re-evaluated prior to induction Oxygen Delivery Method: Circle system utilized Preoxygenation: Pre-oxygenation with 100% oxygen Induction Type: IV induction Ventilation: Mask ventilation without difficulty and Oral airway inserted - appropriate to patient size Laryngoscope Size: Mac and 4 Grade View: Grade I Tube type: Oral Tube size: 8.0 mm Number of attempts: 1 Placement Confirmation: breath sounds checked- equal and bilateral,  positive ETCO2 and ETT inserted through vocal cords under direct vision Secured at: 23 cm Tube secured with: Tape Dental Injury: Teeth and Oropharynx as per pre-operative assessment

## 2016-09-20 NOTE — Progress Notes (Signed)
Patient ID: Joseph [name], male   DOB: 03-09-1940, 76 y.o.   MRN: [id number]  SICU Evening Rounds:   Hemodynamically stable  CI = 1.73 on no drips  Has started to wake up on vent.   Urine output good  CT output low  CBC    Component Value Date/Time   WBC 19.8 (H) 09/20/2016 1455   RBC 4.25 09/20/2016 1455   HGB 13.0 09/20/2016 1455   HGB 15.8 08/29/2016 1347   HCT 38.9 (L) 09/20/2016 1455   HCT 47.2 08/29/2016 1347   PLT 147 (L) 09/20/2016 1455   PLT 271 08/29/2016 1347   MCV 91.5 09/20/2016 1455   MCV 92 08/29/2016 1347   MCH 30.6 09/20/2016 1455   MCHC 33.4 09/20/2016 1455   RDW 13.0 09/20/2016 1455   RDW 13.4 08/29/2016 1347   LYMPHSABS 2.0 02/17/2014 0920   MONOABS 1.0 02/17/2014 0920   EOSABS 0.4 02/17/2014 0920   BASOSABS 0.1 02/17/2014 0920     BMET    Component Value Date/Time   NA 140 09/20/2016 1320   NA 143 08/29/2016 1347   K 4.0 09/20/2016 1320   CL 105 09/20/2016 1320   CO2 20 (L) 09/19/2016 0845   GLUCOSE 153 (H) 09/20/2016 1320   BUN 12 09/20/2016 1320   BUN 14 08/29/2016 1347   CREATININE 0.80 09/20/2016 1320   CALCIUM 9.2 09/19/2016 0845   GFRNONAA >60 09/19/2016 0845   GFRAA >60 09/19/2016 0845     A/P:  Stable postop course. Continue current plans

## 2016-09-21 ENCOUNTER — Inpatient Hospital Stay (HOSPITAL_COMMUNITY): Payer: PPO

## 2016-09-21 ENCOUNTER — Encounter (HOSPITAL_COMMUNITY): Payer: Self-pay | Admitting: *Deleted

## 2016-09-21 LAB — BASIC METABOLIC PANEL
ANION GAP: 6 (ref 5–15)
BUN: 9 mg/dL (ref 6–20)
CO2: 24 mmol/L (ref 22–32)
Calcium: 8 mg/dL — ABNORMAL LOW (ref 8.9–10.3)
Chloride: 109 mmol/L (ref 101–111)
Creatinine, Ser: 0.93 mg/dL (ref 0.61–1.24)
GFR calc Af Amer: >60 mL/min (ref >60)
GFR calc non Af Amer: >60 mL/min (ref >60)
GLUCOSE: 111 mg/dL — AB (ref 65–99)
POTASSIUM: 4.7 mmol/L (ref 3.5–5.1)
Sodium: 139 mmol/L (ref 135–145)

## 2016-09-21 LAB — POCT I-STAT, CHEM 8
BUN: 14 mg/dL (ref 6–20)
CHLORIDE: 97 mmol/L — AB (ref 101–111)
Calcium, Ion: 1.13 mmol/L — ABNORMAL LOW (ref 1.15–1.40)
Creatinine, Ser: 1.1 mg/dL (ref 0.61–1.24)
Glucose, Bld: 173 mg/dL — ABNORMAL HIGH (ref 65–99)
HEMATOCRIT: 36 % — AB (ref 39.0–52.0)
Hemoglobin: 12.2 g/dL — ABNORMAL LOW (ref 13.0–17.0)
POTASSIUM: 4.3 mmol/L (ref 3.5–5.1)
SODIUM: 136 mmol/L (ref 135–145)
TCO2: 25 mmol/L (ref 22–32)

## 2016-09-21 LAB — GLUCOSE, CAPILLARY
GLUCOSE-CAPILLARY: 97 mg/dL (ref 65–99)
GLUCOSE-CAPILLARY: 97 mg/dL (ref 65–99)
GLUCOSE-CAPILLARY: 109 mg/dL — AB (ref 65–99)
Glucose-Capillary: 108 mg/dL — ABNORMAL HIGH (ref 65–99)
Glucose-Capillary: 125 mg/dL — ABNORMAL HIGH (ref 65–99)
Glucose-Capillary: 150 mg/dL — ABNORMAL HIGH (ref 65–99)
Glucose-Capillary: 159 mg/dL — ABNORMAL HIGH (ref 65–99)
Glucose-Capillary: 88 mg/dL (ref 65–99)

## 2016-09-21 LAB — CBC
HCT: 37.4 % — ABNORMAL LOW (ref 39.0–52.0)
HEMATOCRIT: 36.6 % — AB (ref 39.0–52.0)
HEMOGLOBIN: 12.5 g/dL — AB (ref 13.0–17.0)
HEMOGLOBIN: 12.5 g/dL — AB (ref 13.0–17.0)
MCH: 30.9 pg (ref 26.0–34.0)
MCH: 30.4 pg (ref 26.0–34.0)
MCHC: 34.2 g/dL (ref 30.0–36.0)
MCHC: 33.4 g/dL (ref 30.0–36.0)
MCV: 90.4 fL (ref 78.0–100.0)
MCV: 91 fL (ref 78.0–100.0)
PLATELETS: 155 10*3/uL (ref 150–400)
Platelets: 135 10*3/uL — ABNORMAL LOW (ref 150–400)
RBC: 4.05 MIL/uL — ABNORMAL LOW (ref 4.22–5.81)
RBC: 4.11 MIL/uL — AB (ref 4.22–5.81)
RDW: 13.1 % (ref 11.5–15.5)
RDW: 13.3 % (ref 11.5–15.5)
WBC: 15 10*3/uL — AB (ref 4.0–10.5)
WBC: 17.9 10*3/uL — AB (ref 4.0–10.5)

## 2016-09-21 LAB — CREATININE, SERUM
Creatinine, Ser: 1.22 mg/dL (ref 0.61–1.24)
GFR calc Af Amer: >60 mL/min (ref >60)
GFR calc non Af Amer: 57 mL/min — ABNORMAL LOW (ref >60)

## 2016-09-21 LAB — MAGNESIUM
MAGNESIUM: 2.1 mg/dL (ref 1.7–2.4)
Magnesium: 2.3 mg/dL (ref 1.7–2.4)

## 2016-09-21 MED ORDER — INSULIN ASPART 100 UNIT/ML ~~LOC~~ SOLN
0.0000 [IU] | SUBCUTANEOUS | Status: DC
  Administered 2016-09-21 – 2016-09-22 (×2): 2 [IU] via SUBCUTANEOUS

## 2016-09-21 MED ORDER — ORAL CARE MOUTH RINSE
15.0000 mL | Freq: Two times a day (BID) | OROMUCOSAL | Status: DC
  Administered 2016-09-21 – 2016-09-27 (×6): 15 mL via OROMUCOSAL

## 2016-09-21 MED ORDER — FUROSEMIDE 10 MG/ML IJ SOLN
40.0000 mg | Freq: Two times a day (BID) | INTRAMUSCULAR | Status: AC
  Administered 2016-09-21 (×2): 40 mg via INTRAVENOUS
  Filled 2016-09-21 (×2): qty 4

## 2016-09-21 MED ORDER — ENOXAPARIN SODIUM 40 MG/0.4ML ~~LOC~~ SOLN
40.0000 mg | Freq: Every day | SUBCUTANEOUS | Status: DC
  Administered 2016-09-21 – 2016-09-26 (×6): 40 mg via SUBCUTANEOUS
  Filled 2016-09-21 (×6): qty 0.4

## 2016-09-21 MED ORDER — INSULIN ASPART 100 UNIT/ML ~~LOC~~ SOLN
0.0000 [IU] | SUBCUTANEOUS | Status: DC
  Administered 2016-09-21: 4 [IU] via SUBCUTANEOUS
  Administered 2016-09-21 (×2): 2 [IU] via SUBCUTANEOUS

## 2016-09-21 MED ORDER — MIRABEGRON ER 50 MG PO TB24
50.0000 mg | ORAL_TABLET | Freq: Every day | ORAL | Status: DC
  Administered 2016-09-21 – 2016-09-27 (×7): 50 mg via ORAL
  Filled 2016-09-21 (×8): qty 1

## 2016-09-21 NOTE — Progress Notes (Signed)
1 Day Post-Op Procedure(s) (LRB): AORTIC VALVE REPLACEMENT (AVR) USING MAGNA EASE 23 MM PERICARDIAL TISSUE HEART VALVE MODEL 3300TFX (N/A) CORONARY ARTERY BYPASS GRAFTING (CABG)x 3 WITH ENDOSCOPIC HARVESTING OF RIGHT SAPHENOUS VEIN (N/A) TRANSESOPHAGEAL ECHOCARDIOGRAM (TEE) (N/A) Subjective: No pain. Feels ok. Slept overnight  On 45% FM this am.  Objective: Vital signs in last 24 hours: Temp:  [96.3 F (35.7 C)-99.5 F (37.5 C)] 99.3 F (37.4 C) (09/01 0700) Pulse Rate:  [72-90] 86 (09/01 0700) Cardiac Rhythm: Atrial paced (09/01 0015) Resp:  [12-24] 20 (09/01 0700) BP: (69-134)/(49-88) 99/69 (09/01 0400) SpO2:  [92 %-100 %] 94 % (09/01 0700) Arterial Line BP: (80-142)/(48-83) 112/67 (09/01 0700) FiO2 (%):  [30 %-50 %] 45 % (09/01 0415) Weight:  [122 kg (268 lb 15.4 oz)-126.1 kg (278 lb)] 126.1 kg (278 lb) (09/01 0415)  Hemodynamic parameters for last 24 hours: PAP: (28-53)/(14-34) 41/23 CO:  [3.7 L/min-5.6 L/min] 4.7 L/min CI:  [1.6 L/min/m2-2.4 L/min/m2] 2 L/min/m2  Intake/Output from previous day: 08/31 0701 - 09/01 0700 In: 8450.4 [P.O.:180; I.V.:5305.4; Blood:940; IV [id number]:7989] Out: 2119 [ERDEY:8144; Emesis/NG output:200; Blood:2000; Chest Tube:690] Intake/Output this shift: No intake/output data recorded.  General appearance: alert and cooperative Neurologic: intact Heart: regular rate and rhythm, S1, S2 normal, no murmur, click, rub or gallop Lungs: clear to auscultation bilaterally Extremities: edema mild Wound: dressings dry  Lab Results:  Recent Labs  09/20/16 1934 09/20/16 1947 09/21/16 0317  WBC 15.2*  --  15.0*  HGB 13.0 11.6* 12.5*  HCT 38.4* 34.0* 36.6*  PLT 155  --  155   BMET:  Recent Labs  09/19/16 0845  09/20/16 1947 09/21/16 0317  NA 137  < > 136 139  K 4.0  < > 4.6 4.7  CL 108  < > 107 109  CO2 20*  --   --  24  GLUCOSE 126*  < > 137* 111*  BUN 11  < > 11 9  CREATININE 1.01  < > 0.90 0.93  CALCIUM 9.2  --   --  8.0*  < > =  values in this interval not displayed.  PT/INR:  Recent Labs  09/20/16 1455  LABPROT 17.8*  INR 1.48   ABG    Component Value Date/Time   PHART 7.364 09/20/2016 1952   HCO3 23.7 09/20/2016 1952   TCO2 25 09/20/2016 1952   ACIDBASEDEF 2.0 09/20/2016 1952   O2SAT 93.0 09/20/2016 1952   CBG (last 3)   Recent Labs  09/21/16 0111 09/21/16 0154 09/21/16 0328  GLUCAP 97 97 109*   CXR: ok  ECG: sinus, no acute changes  Assessment/Plan: S/P Procedure(s) (LRB): AORTIC VALVE REPLACEMENT (AVR) USING MAGNA EASE 23 MM PERICARDIAL TISSUE HEART VALVE MODEL 3300TFX (N/A) CORONARY ARTERY BYPASS GRAFTING (CABG)x 3 WITH ENDOSCOPIC HARVESTING OF RIGHT SAPHENOUS VEIN (N/A) TRANSESOPHAGEAL ECHOCARDIOGRAM (TEE) (N/A)  POD1 He is hemodynamically stable in sinus rhythm. Mobilize Diuresis d/c tubes/lines Continue foley due to diuresing patient and patient in ICU See progression orders   LOS: 1 day    Gaye Pollack 09/21/2016

## 2016-09-21 NOTE — Progress Notes (Signed)
Patient ID: Joseph [name], male   DOB: [birth year], 76 y.o.   MRN: [id number] SICU Evening Rounds:  Hemodynamically stable in sinus rhythm.  Diuresing well  Up in chair.

## 2016-09-22 ENCOUNTER — Inpatient Hospital Stay (HOSPITAL_COMMUNITY): Payer: PPO

## 2016-09-22 ENCOUNTER — Encounter (HOSPITAL_COMMUNITY): Payer: Self-pay

## 2016-09-22 LAB — BASIC METABOLIC PANEL
ANION GAP: 6 (ref 5–15)
BUN: 13 mg/dL (ref 6–20)
CALCIUM: 8 mg/dL — AB (ref 8.9–10.3)
CO2: 27 mmol/L (ref 22–32)
CREATININE: 1.02 mg/dL (ref 0.61–1.24)
Chloride: 101 mmol/L (ref 101–111)
GFR calc Af Amer: >60 mL/min (ref >60)
GLUCOSE: 118 mg/dL — AB (ref 65–99)
Potassium: 4.4 mmol/L (ref 3.5–5.1)
Sodium: 134 mmol/L — ABNORMAL LOW (ref 135–145)

## 2016-09-22 LAB — CBC
HCT: 36.8 % — ABNORMAL LOW (ref 39.0–52.0)
Hemoglobin: 12.2 g/dL — ABNORMAL LOW (ref 13.0–17.0)
MCH: 30.2 pg (ref 26.0–34.0)
MCHC: 33.2 g/dL (ref 30.0–36.0)
MCV: 91.1 fL (ref 78.0–100.0)
PLATELETS: 133 10*3/uL — AB (ref 150–400)
RBC: 4.04 MIL/uL — ABNORMAL LOW (ref 4.22–5.81)
RDW: 13.3 % (ref 11.5–15.5)
WBC: 17.3 10*3/uL — ABNORMAL HIGH (ref 4.0–10.5)

## 2016-09-22 LAB — GLUCOSE, CAPILLARY: GLUCOSE-CAPILLARY: 141 mg/dL — AB (ref 65–99)

## 2016-09-22 MED ORDER — FUROSEMIDE 10 MG/ML IJ SOLN
40.0000 mg | Freq: Once | INTRAMUSCULAR | Status: AC
  Administered 2016-09-22: 40 mg via INTRAVENOUS
  Filled 2016-09-22: qty 4

## 2016-09-22 NOTE — Evaluation (Signed)
Physical Therapy Evaluation Patient Details Name: Joseph [name] MRN: [id number] DOB: 21-Jul-1940 Today's Date: 09/22/2016   History of Present Illness  Patient is a 76 yo male admitted 09/20/16 with severe AS, and severe CAD.  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     PMH:  hypertension, obesity, normal pressure hydrocephalus s/p VP shunt in 2016, arthritis, rupture of bilateral quadriceps tendons requiring surgical repair and aortic stenosis that was severe   Clinical Impression  Patient presents with problems listed below.  Will benefit from acute PT to maximize functional mobility prior to discharge.  Recommend HHPT f/u for mobility/gait training.     Follow Up Recommendations Home health PT;Supervision for mobility/OOB    Equipment Recommendations  Rolling walker with 5" wheels;3in1 (PT)    Recommendations for Other Services OT consult     Precautions / Restrictions Precautions Precautions: Sternal;Fall Restrictions Weight Bearing Restrictions: Yes Other Position/Activity Restrictions: Sternal precautions      Mobility  Bed Mobility Overal bed mobility: Needs Assistance Bed Mobility: Sit to Supine       Sit to supine: Max assist;+2 for physical assistance   General bed mobility comments: Mod assist to lower trunk and raise LE's onto bed.  Patient using cardiac pillow to maintain precautions.  Transfers Overall transfer level: Needs assistance Equipment used:  [place] walker) Transfers: Sit to/from Stand Sit to Stand: Min assist;+2 physical assistance         General transfer comment: Verbal cues for use of pillow and to shift weight forward.  Assist to keep trunk forward and use LE's to move to stance.  Ambulation/Gait Ambulation/Gait assistance: Min assist;+2 physical assistance;+2 safety/equipment Ambulation Distance (Feet): 140 Feet Assistive device:  (Eva walker) Gait Pattern/deviations: Step-through pattern;Decreased step length - right;Decreased step length -  left;Decreased stride length;Shuffle;Drifts right/left;Trunk flexed Gait velocity: decreased Gait velocity interpretation: Below normal speed for age/gender General Gait Details: Verbal cues to stand upright and extend hips.  Patient ambulated on O2 with assist for balance and to manage [facility] walker.  Fatigue and dyspnea limiting mobility.  Stairs            Wheelchair Mobility    Modified Rankin (Stroke Patients Only)       Balance Overall balance assessment: Needs assistance         Standing balance support: Bilateral upper extremity supported Standing balance-Leahy Scale: Poor                               Pertinent Vitals/Pain Pain Assessment: 0-10 Pain Score: 5  Pain Location: Sternal incision site Pain Descriptors / Indicators: Sore Pain Intervention(s): Monitored during session    Home Living Family/patient expects to be discharged to:: Private residence Living Arrangements: Spouse/significant other Available Help at Discharge: Family;Available 24 hours/day Type of Home: Independent living facility Home Access: Level entry     Home Layout: One level        Prior Function Level of Independence: Independent         Comments: Drives, Retired     Journalist, newspaper        Extremity/Trunk Assessment   Upper Extremity Assessment Upper Extremity Assessment: Generalized weakness (Limited shoulder ROM and MMT due to sternal precautions)    Lower Extremity Assessment Lower Extremity Assessment: Generalized weakness       Communication   Communication: No difficulties  Cognition Arousal/Alertness: Awake/alert Behavior During Therapy: WFL for tasks assessed/performed Overall Cognitive Status: Within Functional  Limits for tasks assessed                                        General Comments      Exercises     Assessment/Plan    PT Assessment Patient needs continued PT services  PT Problem List Decreased  strength;Decreased activity tolerance;Decreased balance;Decreased mobility;Decreased knowledge of use of DME;Decreased knowledge of precautions;Cardiopulmonary status limiting activity;Obesity;Pain       PT Treatment Interventions DME instruction;Gait training;Functional mobility training;Therapeutic activities;Therapeutic exercise;Patient/family education    PT Goals (Current goals can be found in the Care Plan section)  Acute Rehab PT Goals Patient Stated Goal: To walk better PT Goal Formulation: With patient Time For Goal Achievement: 09/29/16 Potential to Achieve Goals: Good    Frequency Min 3X/week   Barriers to discharge        Co-evaluation               AM-PAC PT "6 Clicks" Daily Activity  Outcome Measure Difficulty turning over in bed (including adjusting bedclothes, sheets and blankets)?: Unable Difficulty moving from lying on back to sitting on the side of the bed? : Unable Difficulty sitting down on and standing up from a chair with arms (e.g., wheelchair, bedside commode, etc,.)?: Unable Help needed moving to and from a bed to chair (including a wheelchair)?: A Little Help needed walking in hospital room?: A Little Help needed climbing 3-5 steps with a railing? : A Lot 6 Click Score: 11    End of Session Equipment Utilized During Treatment: Oxygen Activity Tolerance: Patient limited by fatigue Patient left: in bed;with call bell/phone within reach;with nursing/sitter in room Nurse Communication: Mobility status PT Visit Diagnosis: Other abnormalities of gait and mobility (R26.89);Unsteadiness on feet (R26.81);Muscle weakness (generalized) (M62.81);Pain Pain - part of body:  (Sternal incision site)    Time: 8270-7867 PT Time Calculation (min) (ACUTE ONLY): 21 min   Charges:   PT Evaluation $PT Eval Moderate Complexity: 1 Mod     PT G Codes:        Carita Pian. Sanjuana Kava, [facility] Acute Rehab Services Pager [phone]   Despina Pole 09/22/2016, 6:40  PM

## 2016-09-22 NOTE — Progress Notes (Signed)
2 Days Post-Op Procedure(s) (LRB): AORTIC VALVE REPLACEMENT (AVR) USING MAGNA EASE 23 MM PERICARDIAL TISSUE HEART VALVE MODEL 3300TFX (N/A) CORONARY ARTERY BYPASS GRAFTING (CABG)x 3 WITH ENDOSCOPIC HARVESTING OF RIGHT SAPHENOUS VEIN (N/A) TRANSESOPHAGEAL ECHOCARDIOGRAM (TEE) (N/A) Subjective: No complaints. Slept well   Objective: Vital signs in last 24 hours: Temp:  [97.5 F (36.4 C)-99.5 F (37.5 C)] 98.8 F (37.1 C) (09/02 0335) Pulse Rate:  [62-92] 91 (09/02 0700) Cardiac Rhythm: Normal sinus rhythm (09/02 0500) Resp:  [10-25] 14 (09/02 0700) BP: (100-143)/(53-89) 126/53 (09/02 0700) SpO2:  [91 %-97 %] 91 % (09/02 0700) Arterial Line BP: (112-159)/(56-81) 159/81 (09/01 1300) FiO2 (%):  [45 %] 45 % (09/01 0800)  Hemodynamic parameters for last 24 hours: PAP: (38)/(19) 38/19  Intake/Output from previous day: 09/01 0701 - 09/02 0700 In: 2582.1 [P.O.:1980; I.V.:552.1; IV Piggyback:50] Out: 8502 [Urine:3020; Chest Tube:90] Intake/Output this shift: No intake/output data recorded.  General appearance: alert and cooperative Neurologic: intact Heart: regular rate and rhythm, S1, S2 normal, no murmur, click, rub or gallop Lungs: clear anteriorly Extremities: edema mild Wound: dressing dry  Lab Results:  Recent Labs  09/21/16 1725 09/22/16 0335  WBC 17.9* 17.3*  HGB 12.5* 12.2*  HCT 37.4* 36.8*  PLT 135* 133*   BMET:  Recent Labs  09/21/16 0317  09/21/16 1616 09/22/16 0335  NA 139  --  136 134*  K 4.7  --  4.3 4.4  CL 109  --  97* 101  CO2 24  --   --  27  GLUCOSE 111*  --  173* 118*  BUN 9  --  14 13  CREATININE 0.93  < > 1.10 1.02  CALCIUM 8.0*  --   --  8.0*  < > = values in this interval not displayed.  PT/INR:  Recent Labs  09/20/16 1455  LABPROT 17.8*  INR 1.48   ABG    Component Value Date/Time   PHART 7.364 09/20/2016 1952   HCO3 23.7 09/20/2016 1952   TCO2 25 09/21/2016 1616   ACIDBASEDEF 2.0 09/20/2016 1952   O2SAT 93.0 09/20/2016  1952   CBG (last 3)   Recent Labs  09/21/16 1246 09/21/16 1941 09/22/16 0016  GLUCAP 159* 150* 141*   CXR: left base atelectasis  Assessment/Plan: S/P Procedure(s) (LRB): AORTIC VALVE REPLACEMENT (AVR) USING MAGNA EASE 23 MM PERICARDIAL TISSUE HEART VALVE MODEL 3300TFX (N/A) CORONARY ARTERY BYPASS GRAFTING (CABG)x 3 WITH ENDOSCOPIC HARVESTING OF RIGHT SAPHENOUS VEIN (N/A) TRANSESOPHAGEAL ECHOCARDIOGRAM (TEE) (N/A)  POD 2   Hemodynamically stable in sinus rhythm. He is intolerant to beta blockers so will not be able to use.   Mild volume excess: continue diuresis. DC foley today  DC left neck introducer  DM: glucose under adequate control. preop Hgb A1c 5.9 on no meds. Will DC CBG's and SSI.  Debilitation and deconditioning due to obesity and bilateral quad tendon rupture/repairs. Ambulate with assistance, PT consulted. Continue IS.     LOS: 2 days    Joseph [name] 09/22/2016

## 2016-09-23 ENCOUNTER — Encounter (HOSPITAL_COMMUNITY): Payer: Self-pay | Admitting: Surgery

## 2016-09-23 LAB — CBC
HCT: 38.2 % — ABNORMAL LOW (ref 39.0–52.0)
Hemoglobin: 12.6 g/dL — ABNORMAL LOW (ref 13.0–17.0)
MCH: 30.3 pg (ref 26.0–34.0)
MCHC: 33 g/dL (ref 30.0–36.0)
MCV: 91.8 fL (ref 78.0–100.0)
PLATELETS: 133 10*3/uL — AB (ref 150–400)
RBC: 4.16 MIL/uL — ABNORMAL LOW (ref 4.22–5.81)
RDW: 13.3 % (ref 11.5–15.5)
WBC: 16.1 10*3/uL — ABNORMAL HIGH (ref 4.0–10.5)

## 2016-09-23 LAB — BASIC METABOLIC PANEL
Anion gap: 8 (ref 5–15)
BUN: 15 mg/dL (ref 6–20)
CALCIUM: 8.4 mg/dL — AB (ref 8.9–10.3)
CO2: 29 mmol/L (ref 22–32)
CREATININE: 1.08 mg/dL (ref 0.61–1.24)
Chloride: 100 mmol/L — ABNORMAL LOW (ref 101–111)
GFR calc Af Amer: >60 mL/min (ref >60)
Glucose, Bld: 137 mg/dL — ABNORMAL HIGH (ref 65–99)
Potassium: 4.1 mmol/L (ref 3.5–5.1)
Sodium: 137 mmol/L (ref 135–145)

## 2016-09-23 MED ORDER — AMIODARONE HCL 200 MG PO TABS
400.0000 mg | ORAL_TABLET | Freq: Two times a day (BID) | ORAL | Status: DC
  Administered 2016-09-23 – 2016-09-27 (×9): 400 mg via ORAL
  Filled 2016-09-23 (×9): qty 2

## 2016-09-23 MED ORDER — AMIODARONE LOAD VIA INFUSION
150.0000 mg | Freq: Once | INTRAVENOUS | Status: AC
  Administered 2016-09-23: 150 mg via INTRAVENOUS
  Filled 2016-09-23: qty 83.34

## 2016-09-23 MED ORDER — AMIODARONE HCL IN DEXTROSE 360-4.14 MG/200ML-% IV SOLN
60.0000 mg/h | INTRAVENOUS | Status: DC
  Administered 2016-09-23 (×2): 60 mg/h via INTRAVENOUS
  Filled 2016-09-23 (×2): qty 200

## 2016-09-23 MED ORDER — METOPROLOL TARTRATE 5 MG/5ML IV SOLN
INTRAVENOUS | Status: AC
  Filled 2016-09-23: qty 5

## 2016-09-23 MED ORDER — AMIODARONE HCL IN DEXTROSE 360-4.14 MG/200ML-% IV SOLN: 30.0000 mg/h | INTRAVENOUS | Status: DC

## 2016-09-23 NOTE — Progress Notes (Signed)
  Amiodarone Drug - Drug Interaction Consult Note  Recommendations: No major interactions identified  Monitor HR, QTc, zofran use Amiodarone is metabolized by the cytochrome P450 system and therefore has the potential to cause many drug interactions. Amiodarone has an average plasma half-life of 50 days (range 20 to 100 days).   There is potential for drug interactions to occur several weeks or months after stopping treatment and the onset of drug interactions may be slow after initiating amiodarone.   []  Statins: Increased risk of myopathy. Simvastatin- restrict dose to 20mg  daily. Other statins: counsel patients to report any muscle pain or weakness immediately.  []  Anticoagulants: Amiodarone can increase anticoagulant effect. Consider warfarin dose reduction. Patients should be monitored closely and the dose of anticoagulant altered accordingly, remembering that amiodarone levels take several weeks to stabilize.  []  Antiepileptics: Amiodarone can increase plasma concentration of phenytoin, the dose should be reduced. Note that small changes in phenytoin dose can result in large changes in levels. Monitor patient and counsel on signs of toxicity.  [x]  Beta blockers: increased risk of bradycardia, AV block and myocardial depression. Sotalol - avoid concomitant use.  []   Calcium channel blockers (diltiazem and verapamil): increased risk of bradycardia, AV block and myocardial depression.  []   Cyclosporine: Amiodarone increases levels of cyclosporine. Reduced dose of cyclosporine is recommended.  []  Digoxin dose should be halved when amiodarone is started.  []  Diuretics: increased risk of cardiotoxicity if hypokalemia occurs.  []  Oral hypoglycemic agents (glyburide, glipizide, glimepiride): increased risk of hypoglycemia. Patient's glucose levels should be monitored closely when initiating amiodarone therapy.   [x]  Drugs that prolong the QT interval:  Torsades de pointes risk may be  increased with concurrent use - avoid if possible.  Monitor QTc, also keep magnesium/potassium WNL if concurrent therapy can't be avoided. Marland Kitchen Antibiotics: e.g. fluoroquinolones, erythromycin. . Antiarrhythmics: e.g. quinidine, procainamide, disopyramide, sotalol. . Antipsychotics: e.g. phenothiazines, haloperidol.  . Lithium, tricyclic antidepressants, and methadone. Thank You,  Narda Bonds  09/23/2016 2:04 AM

## 2016-09-23 NOTE — Progress Notes (Signed)
3 Days Post-Op Procedure(s) (LRB): AORTIC VALVE REPLACEMENT (AVR) USING MAGNA EASE 23 MM PERICARDIAL TISSUE HEART VALVE MODEL 3300TFX (N/A) CORONARY ARTERY BYPASS GRAFTING (CABG)x 3 WITH ENDOSCOPIC HARVESTING OF RIGHT SAPHENOUS VEIN (N/A) TRANSESOPHAGEAL ECHOCARDIOGRAM (TEE) (N/A) Subjective:  No specific complaints  Went into atrial fib with RVR overnight and started on amio. He converted after a few hrs and is in sinus this am.  Objective: Vital signs in last 24 hours: Temp:  [98.4 F (36.9 C)-99.7 F (37.6 C)] 98.6 F (37 C) (09/03 0700) Pulse Rate:  [38-127] 80 (09/03 0700) Cardiac Rhythm: Atrial fibrillation (09/03 0400) Resp:  [11-24] 17 (09/03 0700) BP: (103-138)/(55-122) 130/55 (09/03 0700) SpO2:  [91 %-98 %] 92 % (09/03 0700) Weight:  [123 kg (271 lb 2.7 oz)] 123 kg (271 lb 2.7 oz) (09/03 0350)  Hemodynamic parameters for last 24 hours:    Intake/Output from previous day: 09/02 0701 - 09/03 0700 In: 770 [P.O.:720; IV Piggyback:50] Out: 3120 [Urine:3120] Intake/Output this shift: No intake/output data recorded.  General appearance: alert and cooperative Neurologic: intact Heart: regular rate and rhythm, S1, S2 normal, no murmur, click, rub or gallop Lungs: diminished breath sounds bibasilar Extremities: extremities normal, atraumatic, no cyanosis or edema Wound: incisions ok  Lab Results:  Recent Labs  09/22/16 0335 09/23/16 0251  WBC 17.3* 16.1*  HGB 12.2* 12.6*  HCT 36.8* 38.2*  PLT 133* 133*   BMET:  Recent Labs  09/22/16 0335 09/23/16 0251  NA 134* 137  K 4.4 4.1  CL 101 100*  CO2 27 29  GLUCOSE 118* 137*  BUN 13 15  CREATININE 1.02 1.08  CALCIUM 8.0* 8.4*    PT/INR:  Recent Labs  09/20/16 1455  LABPROT 17.8*  INR 1.48   ABG    Component Value Date/Time   PHART 7.364 09/20/2016 1952   HCO3 23.7 09/20/2016 1952   TCO2 25 09/21/2016 1616   ACIDBASEDEF 2.0 09/20/2016 1952   O2SAT 93.0 09/20/2016 1952   CBG (last 3)   Recent  Labs  09/21/16 1246 09/21/16 1941 09/22/16 0016  GLUCAP 159* 150* 141*    Assessment/Plan: S/P Procedure(s) (LRB): AORTIC VALVE REPLACEMENT (AVR) USING MAGNA EASE 23 MM PERICARDIAL TISSUE HEART VALVE MODEL 3300TFX (N/A) CORONARY ARTERY BYPASS GRAFTING (CABG)x 3 WITH ENDOSCOPIC HARVESTING OF RIGHT SAPHENOUS VEIN (N/A) TRANSESOPHAGEAL ECHOCARDIOGRAM (TEE) (N/A)  POD 3  He is hemodynamically stable in sinus rhythm  Postop atrial fib with RVR now back in sinus on IV amio. Will switch to po and observe. He is intolerant of beta blockers.  Weight is almost at baseline  Deconditioning and debilitation from lower extremities. Continue ambulation with assistance.    LOS: 3 days    Joseph [name] 09/23/2016

## 2016-09-23 NOTE — Progress Notes (Signed)
Pt went from NSR to Afib RVR.  Pt has an allergy to beta blockers.  Dr. Cyndia Bent on call notified.  Protocol orders started for amnio gtt.  Will continue to monitor.

## 2016-09-23 NOTE — Progress Notes (Signed)
Physical Therapy Treatment Patient Details Name: Joseph [name] MRN: [id number] DOB: 1940-09-24 Today's Date: 09/23/2016    History of Present Illness Patient is a 76 yo male admitted 09/20/16 with severe AS, and severe CAD.  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     PMH:  hypertension, obesity, normal pressure hydrocephalus s/p VP shunt in 2016, arthritis, rupture of bilateral quadriceps tendons requiring surgical repair and aortic stenosis that was severe     PT Comments    Pt is progressing towards his goals. Pt currently modA for bed mobility, and transfers and minA for ambulation of 750 feet with [place] walker. Pt requires skilled PT to progress mobility training and to improve strength and endurance to safely mobilize in his home environment at discharge.    Follow Up Recommendations  Home health PT;Supervision for mobility/OOB     Equipment Recommendations  Rolling walker with 5" wheels;3in1 (PT)    Recommendations for Other Services OT consult     Precautions / Restrictions Precautions Precautions: Sternal;Fall Restrictions Weight Bearing Restrictions: Yes Other Position/Activity Restrictions: Sternal precautions    Mobility  Bed Mobility Overal bed mobility: Needs Assistance Bed Mobility: Sit to Supine           General bed mobility comments: on BSC on entry, modA for LE into bed  Transfers Overall transfer level: Needs assistance Equipment used:  [place] walker) Transfers: Sit to/from Stand Sit to Stand: Mod assist         General transfer comment: modA for power up, vc for sternal precautions and forward lean before powerup  Ambulation/Gait Ambulation/Gait assistance: Min assist Ambulation Distance (Feet): 750 Feet Assistive device:  (Eva walker) Gait Pattern/deviations: Step-through pattern;Decreased step length - right;Decreased step length - left;Decreased stride length;Shuffle;Drifts right/left;Trunk flexed Gait velocity: decreased Gait velocity  interpretation: Below normal speed for age/gender General Gait Details: minA for steadying Eva walker, vc for anterior pelvic tilt, and looking up and out with gait   Stairs            Wheelchair Mobility    Modified Rankin (Stroke Patients Only)       Balance Overall balance assessment: Needs assistance         Standing balance support: Bilateral upper extremity supported Standing balance-Leahy Scale: Poor                              Cognition Arousal/Alertness: Awake/alert Behavior During Therapy: WFL for tasks assessed/performed Overall Cognitive Status: Within Functional Limits for tasks assessed                                           General Comments General comments (skin integrity, edema, etc.): Pt on 1 L O2 via nasal cannula, SaO2 >95%O2 throughout, VSS, wife present for therapy      Pertinent Vitals/Pain Pain Assessment: 0-10 Pain Score: 2  Pain Location: Sternal incision site Pain Descriptors / Indicators: Sore Pain Intervention(s): Monitored during session    Home Living Family/patient expects to be discharged to:: Private residence Living Arrangements: Spouse/significant other Available Help at Discharge: Family;Available 24 hours/day Type of Home: Independent living facility Home Access: Level entry   Home Layout: One level        Prior Function Level of Independence: Independent      Comments: Drives, Retired   PT Goals (current goals  can now be found in the care plan section) Acute Rehab PT Goals Patient Stated Goal: To walk better PT Goal Formulation: With patient Time For Goal Achievement: 09/29/16 Potential to Achieve Goals: Good    Frequency    Min 3X/week      PT Plan Current plan remains appropriate       AM-PAC PT "6 Clicks" Daily Activity  Outcome Measure  Difficulty turning over in bed (including adjusting bedclothes, sheets and blankets)?: Unable Difficulty moving from lying  on back to sitting on the side of the bed? : Unable Difficulty sitting down on and standing up from a chair with arms (e.g., wheelchair, bedside commode, etc,.)?: Unable Help needed moving to and from a bed to chair (including a wheelchair)?: A Little Help needed walking in hospital room?: A Little Help needed climbing 3-5 steps with a railing? : A Lot 6 Click Score: 11    End of Session Equipment Utilized During Treatment: Oxygen Activity Tolerance: Patient tolerated treatment well Patient left: in bed;with call bell/phone within reach;with nursing/sitter in room Nurse Communication: Mobility status PT Visit Diagnosis: Other abnormalities of gait and mobility (R26.89);Unsteadiness on feet (R26.81);Muscle weakness (generalized) (M62.81);Pain Pain - part of body:  (Sternal incision site)     Time: 9179-1505 PT Time Calculation (min) (ACUTE ONLY): 23 min  Charges:  $Gait Training: 8-22 mins $Therapeutic Activity: 8-22 mins                    G Codes:       Reiner Loewen B. Migdalia Dk PT, DPT Acute Rehabilitation  [phone] Pager [phone]     [place] 09/23/2016, 3:51 PM

## 2016-09-24 ENCOUNTER — Encounter (HOSPITAL_COMMUNITY): Payer: Self-pay

## 2016-09-24 LAB — POCT I-STAT 4, (NA,K, GLUC, HGB,HCT)
Glucose, Bld: 127 mg/dL — ABNORMAL HIGH (ref 65–99)
HEMATOCRIT: 34 % — AB (ref 39.0–52.0)
HEMOGLOBIN: 11.6 g/dL — AB (ref 13.0–17.0)
Potassium: 4.3 mmol/L (ref 3.5–5.1)
SODIUM: 140 mmol/L (ref 135–145)

## 2016-09-24 LAB — BASIC METABOLIC PANEL
ANION GAP: 7 (ref 5–15)
BUN: 16 mg/dL (ref 6–20)
CO2: 29 mmol/L (ref 22–32)
Calcium: 8.4 mg/dL — ABNORMAL LOW (ref 8.9–10.3)
Chloride: 104 mmol/L (ref 101–111)
Creatinine, Ser: 1 mg/dL (ref 0.61–1.24)
GFR calc non Af Amer: >60 mL/min (ref >60)
GLUCOSE: 107 mg/dL — AB (ref 65–99)
POTASSIUM: 3.6 mmol/L (ref 3.5–5.1)
Sodium: 140 mmol/L (ref 135–145)

## 2016-09-24 LAB — CBC
HEMATOCRIT: 34.5 % — AB (ref 39.0–52.0)
Hemoglobin: 11.6 g/dL — ABNORMAL LOW (ref 13.0–17.0)
MCH: 30.9 pg (ref 26.0–34.0)
MCHC: 33.6 g/dL (ref 30.0–36.0)
MCV: 91.8 fL (ref 78.0–100.0)
Platelets: 160 10*3/uL (ref 150–400)
RBC: 3.76 MIL/uL — AB (ref 4.22–5.81)
RDW: 13.6 % (ref 11.5–15.5)
WBC: 11.5 10*3/uL — AB (ref 4.0–10.5)

## 2016-09-24 LAB — GLUCOSE, CAPILLARY: Glucose-Capillary: 111 mg/dL — ABNORMAL HIGH (ref 65–99)

## 2016-09-24 MED ORDER — POTASSIUM CHLORIDE CRYS ER 20 MEQ PO TBCR: 20.0000 meq | EXTENDED_RELEASE_TABLET | ORAL | Status: DC

## 2016-09-24 MED ORDER — DILTIAZEM HCL 30 MG PO TABS
30.0000 mg | ORAL_TABLET | Freq: Four times a day (QID) | ORAL | Status: DC
  Administered 2016-09-24: 30 mg via ORAL
  Filled 2016-09-24: qty 1

## 2016-09-24 MED ORDER — DILTIAZEM HCL 60 MG PO TABS
60.0000 mg | ORAL_TABLET | Freq: Four times a day (QID) | ORAL | Status: DC
  Administered 2016-09-24 – 2016-09-27 (×13): 60 mg via ORAL
  Filled 2016-09-24 (×13): qty 1

## 2016-09-24 MED ORDER — WARFARIN SODIUM 5 MG PO TABS
5.0000 mg | ORAL_TABLET | Freq: Once | ORAL | Status: AC
  Administered 2016-09-24: 5 mg via ORAL
  Filled 2016-09-24: qty 1

## 2016-09-24 MED ORDER — AMIODARONE IV BOLUS ONLY 150 MG/100ML
150.0000 mg | Freq: Once | INTRAVENOUS | Status: AC
  Administered 2016-09-24: 150 mg via INTRAVENOUS
  Filled 2016-09-24: qty 100

## 2016-09-24 MED ORDER — POTASSIUM CHLORIDE CRYS ER 20 MEQ PO TBCR
20.0000 meq | EXTENDED_RELEASE_TABLET | ORAL | Status: AC
  Administered 2016-09-24 (×3): 20 meq via ORAL
  Filled 2016-09-24 (×3): qty 1

## 2016-09-24 MED ORDER — WARFARIN - PHYSICIAN DOSING INPATIENT
Freq: Every day | Status: DC
  Administered 2016-09-24 – 2016-09-26 (×2)

## 2016-09-24 MED FILL — Electrolyte-R (PH 7.4) Solution: INTRAVENOUS | Qty: 3000 | Status: AC

## 2016-09-24 MED FILL — Mannitol IV Soln 20%: INTRAVENOUS | Qty: 500 | Status: AC

## 2016-09-24 MED FILL — Sodium Bicarbonate IV Soln 8.4%: INTRAVENOUS | Qty: 50 | Status: AC

## 2016-09-24 MED FILL — Sodium Chloride IV Soln 0.9%: INTRAVENOUS | Qty: 3000 | Status: AC

## 2016-09-24 MED FILL — Lidocaine HCl IV Inj 20 MG/ML: INTRAVENOUS | Qty: 5 | Status: AC

## 2016-09-24 MED FILL — Heparin Sodium (Porcine) Inj 1000 Unit/ML: INTRAMUSCULAR | Qty: 20 | Status: AC

## 2016-09-24 NOTE — Progress Notes (Signed)
4 Days Post-Op Procedure(s) (LRB): AORTIC VALVE REPLACEMENT (AVR) USING MAGNA EASE 23 MM PERICARDIAL TISSUE HEART VALVE MODEL 3300TFX (N/A) CORONARY ARTERY BYPASS GRAFTING (CABG)x 3 WITH ENDOSCOPIC HARVESTING OF RIGHT SAPHENOUS VEIN (N/A) TRANSESOPHAGEAL ECHOCARDIOGRAM (TEE) (N/A) Subjective: No complaints  Went back into atrial fib with RVR this am. Given a bolus of amio.  Bowels working.   Objective: Vital signs in last 24 hours: Temp:  [98.2 F (36.8 C)-99.1 F (37.3 C)] 98.6 F (37 C) (09/04 0733) Pulse Rate:  [82-127] 115 (09/04 0700) Cardiac Rhythm: Atrial fibrillation (09/04 0800) Resp:  [11-25] 17 (09/04 0700) BP: (101-142)/(60-84) 124/67 (09/04 0700) SpO2:  [89 %-97 %] 94 % (09/04 0700) Weight:  [122.8 kg (270 lb 11.6 oz)] 122.8 kg (270 lb 11.6 oz) (09/04 0500)  Hemodynamic parameters for last 24 hours:    Intake/Output from previous day: 09/03 0701 - 09/04 0700 In: 1021.7 [P.O.:840; I.V.:181.7] Out: 675 [Urine:675] Intake/Output this shift: No intake/output data recorded.  General appearance: alert and cooperative Heart: irregularly irregular rhythm Lungs: clear to auscultation bilaterally Extremities: extremities normal, atraumatic, no cyanosis or edema Wound: incision ok  Lab Results:  Recent Labs  09/23/16 0251 09/24/16 0347  WBC 16.1* 11.5*  HGB 12.6* 11.6*  HCT 38.2* 34.5*  PLT 133* 160   BMET:  Recent Labs  09/23/16 0251 09/24/16 0347  NA 137 140  K 4.1 3.6  CL 100* 104  CO2 29 29  GLUCOSE 137* 107*  BUN 15 16  CREATININE 1.08 1.00  CALCIUM 8.4* 8.4*    PT/INR: No results for input(s): LABPROT, INR in the last 72 hours. ABG    Component Value Date/Time   PHART 7.364 09/20/2016 1952   HCO3 23.7 09/20/2016 1952   TCO2 25 09/21/2016 1616   ACIDBASEDEF 2.0 09/20/2016 1952   O2SAT 93.0 09/20/2016 1952   CBG (last 3)   Recent Labs  09/21/16 1246 09/21/16 1941 09/22/16 0016  GLUCAP 159* 150* 141*    Assessment/Plan: S/P  Procedure(s) (LRB): AORTIC VALVE REPLACEMENT (AVR) USING MAGNA EASE 23 MM PERICARDIAL TISSUE HEART VALVE MODEL 3300TFX (N/A) CORONARY ARTERY BYPASS GRAFTING (CABG)x 3 WITH ENDOSCOPIC HARVESTING OF RIGHT SAPHENOUS VEIN (N/A) TRANSESOPHAGEAL ECHOCARDIOGRAM (TEE) (N/A) POD 4 He is hemodynamically stable  Postop atrial fibrillation: continue amiodarone and start Cardizem 30 every 6 hrs since he does not tolerate beta blockers. EF is ok. Continue IS, ambulation. PT has seen patient. Will transfer to 4E later if HR under control.   LOS: 4 days    Joseph [name] 09/24/2016

## 2016-09-24 NOTE — Progress Notes (Signed)
Physical Therapy Treatment Patient Details Name: Joseph [name] MRN: [id number] DOB: [birth year] Today's Date: 09/24/2016    History of Present Illness Patient is a 76 yo male admitted 09/20/16 with severe AS, and severe CAD.  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     PMH:  hypertension, obesity, normal pressure hydrocephalus s/p VP shunt in 2016, arthritis, rupture of bilateral quadriceps tendons requiring surgical repair and aortic stenosis that was severe     PT Comments    Pt progressed from [place] walker to RW today but could not tolerate as far distance due to SOB and fatigue, HR up to 130 bpm and pt with 3/4 DOE. O2 sats remained in 90s on RA. PT will continue to follow.    Follow Up Recommendations  Home health PT;Supervision for mobility/OOB     Equipment Recommendations  Rolling walker with 5" wheels;3in1 (PT)    Recommendations for Other Services OT consult     Precautions / Restrictions Precautions Precautions: Sternal;Fall Restrictions Weight Bearing Restrictions: Yes (Sternal precautions) Other Position/Activity Restrictions: Sternal precautions    Mobility  Bed Mobility Overal bed mobility: Needs Assistance Bed Mobility: Sit to Supine       Sit to supine: Mod assist   General bed mobility comments: vc's for sequencing to keep sternal precautions, mod A for trunk elevation  Transfers Overall transfer level: Needs assistance Equipment used: Rolling walker (2 wheeled) Engineer, water walker) Transfers: Sit to/from Stand Sit to Stand: Min assist         General transfer comment: holds pillow with sit to stand, min-guard from bed, min A second time from recliner  Ambulation/Gait Ambulation/Gait assistance: Min guard Ambulation Distance (Feet): 300 Feet Assistive device: Rolling walker (2 wheeled) (Eva walker) Gait Pattern/deviations: Step-through pattern;Decreased step length - right;Decreased step length - left;Decreased stride length;Shuffle;Drifts right/left;Trunk  flexed Gait velocity: decreased Gait velocity interpretation: Below normal speed for age/gender General Gait Details: pt ambulated with RW instead of Eva to begin prep for home, tired him out much faster and he bacame SOB after 200'. HR up to 130 bpm, O2 sats remained in 90's on RA. 3 min rest before second ambulation of 50', pt fatigued faster second time.    Stairs            Wheelchair Mobility    Modified Rankin (Stroke Patients Only)       Balance Overall balance assessment: Needs assistance Sitting-balance support: No upper extremity supported Sitting balance-Leahy Scale: Good     Standing balance support: Bilateral upper extremity supported Standing balance-Leahy Scale: Poor                              Cognition Arousal/Alertness: Awake/alert Behavior During Therapy: WFL for tasks assessed/performed Overall Cognitive Status: Within Functional Limits for tasks assessed                                        Exercises      General Comments        Pertinent Vitals/Pain Pain Assessment: Faces Faces Pain Scale: Hurts a little bit Pain Location: Sternal incision site Pain Descriptors / Indicators: Sore Pain Intervention(s): Limited activity within patient's tolerance;Monitored during session    Home Living                      Prior Function  PT Goals (current goals can now be found in the care plan section) Acute Rehab PT Goals Patient Stated Goal: To walk better PT Goal Formulation: With patient Time For Goal Achievement: 09/29/16 Potential to Achieve Goals: Good Progress towards PT goals: Progressing toward goals    Frequency    Min 3X/week      PT Plan Current plan remains appropriate    Co-evaluation              AM-PAC PT "6 Clicks" Daily Activity  Outcome Measure  Difficulty turning over in bed (including adjusting bedclothes, sheets and blankets)?: Unable Difficulty moving  from lying on back to sitting on the side of the bed? : Unable Difficulty sitting down on and standing up from a chair with arms (e.g., wheelchair, bedside commode, etc,.)?: Unable Help needed moving to and from a bed to chair (including a wheelchair)?: A Little Help needed walking in hospital room?: A Little Help needed climbing 3-5 steps with a railing? : A Lot 6 Click Score: 11    End of Session Equipment Utilized During Treatment: Gait belt Activity Tolerance: Patient limited by fatigue Patient left: with call bell/phone within reach;Other (comment) [facility]) Nurse Communication: Mobility status PT Visit Diagnosis: Other abnormalities of gait and mobility (R26.89);Unsteadiness on feet (R26.81);Muscle weakness (generalized) (M62.81);Pain Pain - part of body:  (Sternal incision site)     Time: 2637-8588 PT Time Calculation (min) (ACUTE ONLY): 46 min  Charges:  $Gait Training: 23-37 mins $Therapeutic Activity: 8-22 mins                    G Codes:       Leighton Roach, PT  Acute Rehab Services  [place] 09/24/2016, 3:59 PM

## 2016-09-24 NOTE — Progress Notes (Signed)
Patient ID: Joseph [name], male   DOB: 11-01-1940, 76 y.o.   MRN: [id number] EVENING ROUNDS NOTE :     [address]       [place] [address]             [phone]                 4 Days Post-Op Procedure(s) (LRB): AORTIC VALVE REPLACEMENT (AVR) USING MAGNA EASE 23 MM PERICARDIAL TISSUE HEART VALVE MODEL 3300TFX (N/A) CORONARY ARTERY BYPASS GRAFTING (CABG)x 3 WITH ENDOSCOPIC HARVESTING OF RIGHT SAPHENOUS VEIN (N/A) TRANSESOPHAGEAL ECHOCARDIOGRAM (TEE) (N/A)  Total Length of Stay:  LOS: 4 days  BP 126/65   Pulse 98   Temp 98.7 F (37.1 C) (Oral)   Resp (!) 24   Ht 5\' 9"  (1.753 m)   Wt 270 lb 11.6 oz (122.8 kg)   SpO2 94%   BMI 39.98 kg/m   .Intake/Output      09/03 0701 - 09/04 0700 09/04 0701 - 09/05 0700   P.O. 840 480   I.V. (mL/kg) 181.7 (1.5) 100 (0.8)   Total Intake(mL/kg) 1021.7 (8.3) 580 (4.7)   Urine (mL/kg/hr) 675 (0.2) 550 (0.5)   Total Output 675 550   Net +346.7 +30        Urine Occurrence 4 x    Stool Occurrence 4 x      . sodium chloride Stopped (09/21/16 0858)  . sodium chloride    . sodium chloride Stopped (09/21/16 0858)  . lactated ringers Stopped (09/20/16 1837)  . lactated ringers 20 mL/hr at 09/22/16 0700     Lab Results  Component Value Date   WBC 11.5 (H) 09/24/2016   HGB 11.6 (L) 09/24/2016   HCT 34.5 (L) 09/24/2016   PLT 160 09/24/2016   GLUCOSE 107 (H) 09/24/2016   ALT 46 09/19/2016   AST 31 09/19/2016   NA 140 09/24/2016   K 3.6 09/24/2016   CL 104 09/24/2016   CREATININE 1.00 09/24/2016   BUN 16 09/24/2016   CO2 29 09/24/2016   INR 1.48 09/20/2016   HGBA1C 5.9 (H) 09/19/2016   afib currently 90-100 rate    Grace Isaac MD  Beeper [phone] Office [phone] 09/24/2016 4:53 PM

## 2016-09-24 NOTE — Care Management Note (Signed)
Case Management Note Marvetta Gibbons RN, BSN Unit 4E-Case Manager--2H coverage [phone]  Patient Details  Name: Joseph [name] MRN: [id number] Date of Birth: 23-Aug-1940  Subjective/Objective:   Pt admitted s/p CABGx3 and AVR  09/20/16              Action/Plan: PTA pt lived at home with spouse- CM to follow for d/c needs-  Post op day 4- pt with post op afib  Expected Discharge Date:                  Expected Discharge Plan:     In-House Referral:     Discharge planning Services  CM Consult  Post Acute Care Choice:    Choice offered to:     DME Arranged:    DME Agency:     HH Arranged:    [place] Agency:     Status of Service:  In process, will continue to follow  If discussed at Long Length of Stay Meetings, dates discussed:    Discharge Disposition:   Additional Comments:  Dawayne Patricia, RN 09/24/2016, 10:25 AM

## 2016-09-24 NOTE — Progress Notes (Signed)
Dr. Cyndia Bent updated on pt's recurrent Afib RVR 120-130s, despite PO Amiodarone. Verbal order received to administer Amiodarone IV bolus 150 mg but no continuous gtt at this time. Will implement and continue to monitor pt closely.

## 2016-09-25 LAB — PROTIME-INR
INR: 1.16
Prothrombin Time: 14.7 seconds (ref 11.4–15.2)

## 2016-09-25 MED ORDER — ONDANSETRON HCL 4 MG PO TABS: 4.0000 mg | ORAL_TABLET | Freq: Four times a day (QID) | ORAL | Status: DC | PRN

## 2016-09-25 MED ORDER — ONDANSETRON HCL 4 MG/2ML IJ SOLN: 4.0000 mg | Freq: Four times a day (QID) | INTRAMUSCULAR | Status: DC | PRN

## 2016-09-25 MED ORDER — MOVING RIGHT ALONG BOOK
Freq: Once | Status: AC
  Administered 2016-09-25: 08:00:00
  Filled 2016-09-25: qty 1

## 2016-09-25 MED ORDER — POTASSIUM CHLORIDE CRYS ER 20 MEQ PO TBCR
20.0000 meq | EXTENDED_RELEASE_TABLET | Freq: Three times a day (TID) | ORAL | Status: AC
  Administered 2016-09-25 (×3): 20 meq via ORAL
  Filled 2016-09-25 (×3): qty 1

## 2016-09-25 MED ORDER — OXYCODONE HCL 5 MG PO TABS: 5.0000 mg | ORAL_TABLET | ORAL | Status: DC | PRN

## 2016-09-25 MED ORDER — SODIUM CHLORIDE 0.9% FLUSH: 3.0000 mL | INTRAVENOUS | Status: DC | PRN

## 2016-09-25 MED ORDER — PANTOPRAZOLE SODIUM 40 MG PO TBEC
40.0000 mg | DELAYED_RELEASE_TABLET | Freq: Every day | ORAL | Status: DC
  Administered 2016-09-25 – 2016-09-27 (×3): 40 mg via ORAL
  Filled 2016-09-25 (×3): qty 1

## 2016-09-25 MED ORDER — TRAMADOL HCL 50 MG PO TABS: 50.0000 mg | ORAL_TABLET | ORAL | Status: DC | PRN

## 2016-09-25 MED ORDER — WARFARIN SODIUM 5 MG PO TABS
5.0000 mg | ORAL_TABLET | Freq: Once | ORAL | Status: AC
  Administered 2016-09-25: 5 mg via ORAL
  Filled 2016-09-25: qty 1

## 2016-09-25 MED ORDER — SODIUM CHLORIDE 0.9 % IV SOLN: 250.0000 mL | INTRAVENOUS | Status: DC | PRN

## 2016-09-25 MED ORDER — ASPIRIN EC 81 MG PO TBEC
81.0000 mg | DELAYED_RELEASE_TABLET | Freq: Every day | ORAL | Status: DC
  Administered 2016-09-25 – 2016-09-27 (×3): 81 mg via ORAL
  Filled 2016-09-25 (×3): qty 1

## 2016-09-25 MED ORDER — SODIUM CHLORIDE 0.9% FLUSH
3.0000 mL | Freq: Two times a day (BID) | INTRAVENOUS | Status: DC
  Administered 2016-09-25 – 2016-09-26 (×3): 3 mL via INTRAVENOUS

## 2016-09-25 MED ORDER — ACETAMINOPHEN 325 MG PO TABS: 650.0000 mg | ORAL_TABLET | Freq: Four times a day (QID) | ORAL | Status: DC | PRN

## 2016-09-25 MED FILL — Potassium Chloride Inj 2 mEq/ML: INTRAVENOUS | Qty: 40 | Status: AC

## 2016-09-25 MED FILL — Magnesium Sulfate Inj 50%: INTRAMUSCULAR | Qty: 10 | Status: AC

## 2016-09-25 MED FILL — Heparin Sodium (Porcine) Inj 1000 Unit/ML: INTRAMUSCULAR | Qty: 30 | Status: AC

## 2016-09-25 NOTE — Progress Notes (Signed)
CARDIAC REHAB PHASE I   PRE:  Rate/Rhythm: 74 SR    BP: sitting 154/90    SaO2: 96 RA  MODE:  Ambulation: 470 ft   POST:  Rate/Rhythm: 100 ST    BP: sitting 145/86     SaO2: 97 RA  Pt needed mod assist to get to EOB. Stood independent and used RW. Fairly steady, rest x2 for SOB. HR maintained NSR. Tired after walk, return to bed. Encouraged chair and IS this evening. He has a rollator at home but enjoyed using RW. Will continue to eval tomorrow (cane, RW, rollator). [place], ACSM 09/25/2016 3:38 PM

## 2016-09-25 NOTE — Progress Notes (Addendum)
T[facility] DAILY ICU PROGRESS NOTE                   [address]            [place], [address]          [phone]   5 Days Post-Op Procedure(s) (LRB): AORTIC VALVE REPLACEMENT (AVR) USING MAGNA EASE 23 MM PERICARDIAL TISSUE HEART VALVE MODEL 3300TFX (N/A) CORONARY ARTERY BYPASS GRAFTING (CABG)x 3 WITH ENDOSCOPIC HARVESTING OF RIGHT SAPHENOUS VEIN (N/A) TRANSESOPHAGEAL ECHOCARDIOGRAM (TEE) (N/A)  Total Length of Stay:  LOS: 5 days   Subjective:  No new complaints.  He feels pretty good.  +ambulation   + BM  Objective: Vital signs in last 24 hours: Temp:  [98.3 F (36.8 C)-99.3 F (37.4 C)] 98.4 F (36.9 C) (09/05 0400) Pulse Rate:  [28-105] 76 (09/05 0600) Cardiac Rhythm: Normal sinus rhythm (09/05 0600) Resp:  [16-30] 16 (09/05 0600) BP: (103-140)/(65-97) 122/79 (09/05 0600) SpO2:  [79 %-96 %] 94 % (09/05 0600) Weight:  [269 lb 6.4 oz (122.2 kg)] 269 lb 6.4 oz (122.2 kg) (09/05 0515)  Filed Weights   09/23/16 0350 09/24/16 0500 09/25/16 0515  Weight: 271 lb 2.7 oz (123 kg) 270 lb 11.6 oz (122.8 kg) 269 lb 6.4 oz (122.2 kg)    Weight change: -1 lb 5.2 oz (-0.6 kg)   Hemodynamic parameters for last 24 hours:    Intake/Output from previous day: 09/04 0701 - 09/05 0700 In: 1060 [P.O.:960; I.V.:100] Out: 700 [Urine:700]  Current Meds: Scheduled Meds: . amiodarone  400 mg Oral BID  . aspirin EC  81 mg Oral Daily  . diltiazem  60 mg Oral Q6H  . enoxaparin (LOVENOX) injection  40 mg Subcutaneous QHS  . mouth rinse  15 mL Mouth Rinse BID  . mirabegron ER  50 mg Oral Daily  . moving right along book   Does not apply Once  . pantoprazole  40 mg Oral QAC breakfast  . sodium chloride flush  3 mL Intravenous Q12H  . Warfarin - Physician Dosing Inpatient   Does not apply q1800   Continuous Infusions: . sodium chloride     PRN Meds:.sodium chloride, acetaminophen, ondansetron **OR** ondansetron (ZOFRAN) IV, oxyCODONE, sodium chloride flush,  traMADol  General appearance: alert, cooperative and no distress Heart: regular rate and rhythm Lungs: clear to auscultation bilaterally Abdomen: soft, non-tender; bowel sounds normal; no masses,  no organomegaly Extremities: edema trace Wound: clean and dry  Lab Results: CBC: Recent Labs  09/23/16 0251 09/24/16 0347  WBC 16.1* 11.5*  HGB 12.6* 11.6*  HCT 38.2* 34.5*  PLT 133* 160   BMET:  Recent Labs  09/23/16 0251 09/24/16 0347  NA 137 140  K 4.1 3.6  CL 100* 104  CO2 29 29  GLUCOSE 137* 107*  BUN 15 16  CREATININE 1.08 1.00  CALCIUM 8.4* 8.4*    CMET: Lab Results  Component Value Date   WBC 11.5 (H) 09/24/2016   HGB 11.6 (L) 09/24/2016   HCT 34.5 (L) 09/24/2016   PLT 160 09/24/2016   GLUCOSE 107 (H) 09/24/2016   ALT 46 09/19/2016   AST 31 09/19/2016   NA 140 09/24/2016   K 3.6 09/24/2016   CL 104 09/24/2016   CREATININE 1.00 09/24/2016   BUN 16 09/24/2016   CO2 29 09/24/2016   INR 1.16 09/25/2016   HGBA1C 5.9 (H) 09/19/2016      PT/INR:  Recent Labs  09/25/16 0434  LABPROT 14.7  INR 1.16   Radiology: No results found.   Assessment/Plan: S/P Procedure(s) (LRB): AORTIC VALVE REPLACEMENT (AVR) USING MAGNA EASE 23 MM PERICARDIAL TISSUE HEART VALVE MODEL 3300TFX (N/A) CORONARY ARTERY BYPASS GRAFTING (CABG)x 3 WITH ENDOSCOPIC HARVESTING OF RIGHT SAPHENOUS VEIN (N/A) TRANSESOPHAGEAL ECHOCARDIOGRAM (TEE) (N/A)  1. CV- PAF, currently NSR- continue Amiodarone, Cardizem  2. INR 1.16, will repeat coumadin at 5 mg daily 3. Renal- creatinine stable, weight is trending down, not currently on Lasix 4. D/C EPW today 5. Dispo-patient stable, in NSR, will continue amiodarone, cardizem, continue coumadin at current dose, d/c EPW today...Marland Kitchen Awaiting bed on 4E     BARRETT, ERIN 09/25/2016 7:55 AM    Chart reviewed, patient examined, agree with above. He is feeling better. He say he was able to get up off the bedside commode without help so legs getting  stronger. Evaluated by PT yesterday.  He is back in sinus this am. Qtc 463 which is ok. DC PW.

## 2016-09-25 NOTE — Progress Notes (Signed)
Pericardial wires removed by Idelia Salm RN and Arline Asp RN. Pt instructed to stay in bed for 1 hr and frequent vital checks. No complications with removal; will continue to monitor.

## 2016-09-25 NOTE — Anesthesia Postprocedure Evaluation (Signed)
Anesthesia Post Note  Patient: Joseph [name]  Procedure(s) Performed: Procedure(s) (LRB): AORTIC VALVE REPLACEMENT (AVR) USING MAGNA EASE 23 MM PERICARDIAL TISSUE HEART VALVE MODEL 3300TFX (N/A) CORONARY ARTERY BYPASS GRAFTING (CABG)x 3 WITH ENDOSCOPIC HARVESTING OF RIGHT SAPHENOUS VEIN (N/A) TRANSESOPHAGEAL ECHOCARDIOGRAM (TEE) (N/A)     Patient location during evaluation: SICU Anesthesia Type: General Level of consciousness: sedated Pain management: pain level controlled Vital Signs Assessment: post-procedure vital signs reviewed and stable Respiratory status: patient remains intubated per anesthesia plan Cardiovascular status: stable Anesthetic complications: no    Last Vitals:  Vitals:   09/25/16 1932 09/25/16 2000  BP: 135/77   Pulse:  81  Resp: 19 (!) 23  Temp: 37.5 C   SpO2: 96% 93%    Last Pain:  Vitals:   09/25/16 1932  TempSrc: Oral  PainSc:                  [place] S

## 2016-09-26 DIAGNOSIS — I4891 Unspecified atrial fibrillation: Secondary | ICD-10-CM

## 2016-09-26 DIAGNOSIS — I4819 Other persistent atrial fibrillation: Secondary | ICD-10-CM

## 2016-09-26 DIAGNOSIS — Z952 Presence of prosthetic heart valve: Secondary | ICD-10-CM

## 2016-09-26 LAB — PROTIME-INR
INR: 1.34
Prothrombin Time: 16.5 seconds — ABNORMAL HIGH (ref 11.4–15.2)

## 2016-09-26 MED ORDER — POTASSIUM CHLORIDE CRYS ER 20 MEQ PO TBCR
20.0000 meq | EXTENDED_RELEASE_TABLET | Freq: Every day | ORAL | Status: DC
  Administered 2016-09-26 – 2016-09-27 (×2): 20 meq via ORAL
  Filled 2016-09-26 (×2): qty 1

## 2016-09-26 MED ORDER — FUROSEMIDE 40 MG PO TABS
40.0000 mg | ORAL_TABLET | Freq: Every day | ORAL | Status: DC
  Administered 2016-09-26 – 2016-09-27 (×2): 40 mg via ORAL
  Filled 2016-09-26 (×2): qty 1

## 2016-09-26 MED ORDER — WARFARIN SODIUM 5 MG PO TABS
5.0000 mg | ORAL_TABLET | Freq: Every day | ORAL | Status: DC
  Administered 2016-09-26: 5 mg via ORAL
  Filled 2016-09-26: qty 1

## 2016-09-26 MED ORDER — ATORVASTATIN CALCIUM 10 MG PO TABS
10.0000 mg | ORAL_TABLET | Freq: Every day | ORAL | Status: DC
  Administered 2016-09-26: 10 mg via ORAL
  Filled 2016-09-26: qty 1

## 2016-09-26 MED ORDER — WARFARIN VIDEO
Freq: Once | Status: AC
  Administered 2016-09-26: 12:00:00

## 2016-09-26 MED ORDER — PATIENT'S GUIDE TO USING COUMADIN BOOK
Freq: Once | Status: AC
  Administered 2016-09-26: 12:00:00
  Filled 2016-09-26: qty 1

## 2016-09-26 NOTE — Discharge Summary (Signed)
Physician Discharge Summary  Patient ID: Joseph [name] MRN: [id number] DOB/AGE: 08/13/1940 76 y.o.  Admit date: 09/20/2016 Discharge date: 09/27/2016  Admission Diagnoses:Aortic stenosis/coronary artery disease  Discharge Diagnoses:  Active Problems:   S/P CABG x 3   S/P AVR   Atrial fibrillation [facility])  Patient Active Problem List   Diagnosis Date Noted  . S/P AVR 09/26/2016  . Atrial fibrillation ([place]) 09/26/2016  . 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 Rupture of left quadriceps tendon 07/21/2014  . Abnormal nuclear stress test 07/20/2014  . Morbid obesity ([place]) 07/20/2014  . Aortic stenosis 07/20/2014  . S/P ventriculoperitoneal shunt 03/03/2014  . Pre-operative cardiovascular examination, valvular heart disease 02/22/2014  . 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 HTN (hypertension) 11/18/2013   HPI:  The patient is a 76 year old gentleman with hypertension, obesity, normal pressure hydrocephalus s/p VP shunt in 2016, arthritis, rupture of bilateral quadriceps tendons requiring surgical repair and aortic stenosis that was severe by echo in 10/2014 with a mean gradient of 43 mm Hg and a DI of 0.17. He is not that active due to his arthritis and quadriceps tendon surgeries and obesity but does water aerobics at [employer] He does have shortness of breath with exertion during longer walks. He underwent a recent echo on 08/12/2016 showing a mean gradient of 46 mm Hg with an LVEF of 55-60%. His cath shows a calcified 90% proximal to mid LAD stenosis at the takeoff of a moderate sized diagonal. The LCX has mild non-obstructive disease. The RCA is occluded in its mid-portion with faint filling of the distal vessel by collaterals from the LAD. He denies any chest pain or pressure, dizziness or syncope and peripheral edema.  The patient was admitted electively for AVR/CABG  Discharged Condition: good  Hospital Course: The patient was admitted electively for the procedure. On 09/20/2016 he was  taken the operating room where he underwent a below described procedure. He tolerated it well and was taken to the surgical intensive care unit in stable condition.   Postoperative hospital course:  Overall the patient has progressed nicely. He remained hemodynamically stable in the SICU and was weaned from the vent without significant difficulty. He did have postoperative volume overload which has responded to diuretics. All routine lines, monitors and drainage devices have been discontinued in standard fashion. He has an expected acute blood loss anemia which is stable. Most recent hemoglobin/hematocrit is 11.6/34.5. He has normal renal function. Blood sugars have been under good control. He did have postoperative atrial fibrillation and has been chemically cardioverted to sinus rhythm. He has also been started on Coumadin. Oxygen was weaned maintains good sats on room air. He has been evaluated by physical therapy and is felt that he would be safe for discharge home with home health.  These arrangements have been made.  Incisions are noted to be healing well without evidence of infection. His blood sugars are under adequate control. At time of discharge the patient is felt to be quite stable.  Consults: None  Significant Diagnostic Studies: Routine postop laboratory/serial chest x-rays  Treatments: surgery:  09/20/2016  Surgeon:  Gaye Pollack, MD  First Assistant: Jadene Pierini,  PA-C   Preoperative Diagnosis:  Severe multi-vessel coronary artery disease and severe aortic stenosis.   Postoperative Diagnosis:  Same   Procedure:  1. Median Sternotomy 2. Extracorporeal circulation 3.   Coronary artery bypass grafting x 3   Left internal mammary graft to the LAD  SVG to diagonal  SVG to RCA  4.   Endoscopic vein harvest from the right leg  5.   Aortic valve replacement using a 23 mm Edwards Magna-Ease pericardial valve   Anesthesia:  General  Endotracheal  Disposition: SNF short term Discharge Instructions    Amb Referral to Cardiac Rehabilitation    Complete by:  As directed    Diagnosis:   CABG Valve Replacement     Valve:  Aortic   CABG X ___:  3     Allergies as of 09/27/2016      Reactions   Ace Inhibitors Cough   Atenolol Shortness Of Breath   Beta Adrenergic Blockers Shortness Of Breath   Moderate reaction   Erythromycin Diarrhea   Ciprofloxacin Other (See Comments)   ANYTHING RELATED TO CIPRO, TENDON RUPTURES       Medication List    STOP taking these medications   losartan 100 MG tablet Commonly known as:  COZAAR     TAKE these medications   acetaminophen 500 MG tablet Commonly known as:  TYLENOL Take 500-1,000 mg by mouth 4 (four) times daily as needed for moderate pain.   amiodarone 400 MG tablet Commonly known as:  PACERONE Take 1 tablet (400 mg total) by mouth 2 (two) times daily. X 7 days, then decrease to 200 mg (1/2 tab) daily   aspirin EC 81 MG tablet Take 81 mg by mouth daily.   atorvastatin 10 MG tablet Commonly known as:  LIPITOR Take 1 tablet (10 mg total) by mouth daily at 6 PM.   B-COMPLEX HIGH POTENCY PO Take 1 tablet by mouth daily.   CALCIUM 600-D 600-400 MG-UNIT Tabs Generic drug:  Calcium Carbonate-Vitamin D3 Take 3 tablets by mouth daily.   diltiazem 60 MG tablet Commonly known as:  CARDIZEM Take 1 tablet (60 mg total) by mouth every 6 (six) hours.   lansoprazole 30 MG capsule Commonly known as:  PREVACID Take 60 mg by mouth daily.   METAMUCIL 0.52 g capsule Generic drug:  psyllium Take 1.56 g by mouth daily.   MYRBETRIQ 50 MG Tb24 tablet Generic drug:  mirabegron ER Take 50 mg by mouth daily.   nabumetone 750 MG tablet Commonly known as:  RELAFEN Take 1,500 mg by mouth daily.   traMADol 50 MG tablet Commonly known as:  ULTRAM Take 1 tablet (50 mg total) by mouth every 4 (four) hours as needed for moderate pain.   warfarin 4 MG tablet Commonly known  as:  COUMADIN Take 1 tablet (4 mg total) by mouth daily at 6 PM.            Discharge Care Instructions        Start     Ordered   09/27/16 0000  amiodarone (PACERONE) 400 MG tablet  2 times daily    Question:  Supervising Provider  Answer:  Gaye Pollack   09/27/16 0754   09/27/16 0000  atorvastatin (LIPITOR) 10 MG tablet  Daily-1800    Question:  Supervising Provider  Answer:  Gilford Raid K   09/27/16 0754   09/27/16 0000  diltiazem (CARDIZEM) 60 MG tablet  Every 6 hours    Question:  Supervising Provider  Answer:  Gilford Raid K   09/27/16 0754   09/27/16 0000  traMADol (ULTRAM) 50 MG tablet  Every 4 hours PRN    Question:  Supervising Provider  Answer:  Gilford Raid K   09/27/16 0754   09/27/16 0000  warfarin (COUMADIN) 4 MG tablet  [id number]    Question:  Supervising Provider  Answer:  Gaye Pollack   09/27/16 0754   09/26/16 0000  Amb Referral to Cardiac Rehabilitation    Question Answer Comment  Diagnosis: CABG   Diagnosis: Valve Replacement   Valve: Aortic   CABG X ___ 3      09/26/16 1111     Follow-up Information    Gaye Pollack, MD Follow up.   Specialty:  Cardiothoracic Surgery Why:  Appointment to see Dr. Cyndia Bent on 10/30/2016 at 9:30 AM. Please obtain a chest x-ray [place] in the evening at 9 AM. [facility] imaging is located in the same office complex. Contact information: [address] [place]  [address] [phone]        Jettie Booze, MD Follow up.   Specialties:  Cardiology, Radiology, Interventional Cardiology Why:  Please see discharge paperwork regarding follow-up cardiology appointment in Coumadin clinic appointment Contact information: [address] [place] [address] [phone]        [place] Office Follow up on 10/01/2016.   Specialty:  Cardiology Why:  Appointment is at 2:30 for PT/INR check Contact information: [address] [place] [phone]         1. Please obtain vital signs at least one time daily 2.Please weigh the patient daily. If he or she continues to gain weight or develops lower extremity edema, contact the office at [phone]. 3. Ambulate patient at least three times daily and please use sternal precautions. 4 check PT/INR to keep in 2-2.5 range.  The patient has been discharged on:   1.Beta Blocker:  Yes [   ]                              No   [ n  ]                              If No, reason:allergy  2.Ace Inhibitor/ARB: Yes [   ]                                     No  [  n  ]                                     If No, reason:intolerant  3.Statin:   Yes [  y ]                  No  [   ]                  If No, reason:  4.Ecasa:  Yes  [ y  ]                  No   [   ]                  If No, reason:  Signed: Madysyn Hanken 09/27/2016, 7:57 AM

## 2016-09-26 NOTE — Progress Notes (Signed)
      [address]       [place] [address]             [phone]      6 Days Post-Op Procedure(s) (LRB): AORTIC VALVE REPLACEMENT (AVR) USING MAGNA EASE 23 MM PERICARDIAL TISSUE HEART VALVE MODEL 3300TFX (N/A) CORONARY ARTERY BYPASS GRAFTING (CABG)x 3 WITH ENDOSCOPIC HARVESTING OF RIGHT SAPHENOUS VEIN (N/A) TRANSESOPHAGEAL ECHOCARDIOGRAM (TEE) (N/A)   Subjective:  Mr. Winegarden is feeling much better.  He has been weaned off oxygen and overall feels stronger.  He was evaluated by PT yesterday and he is agreeable to go to SNF if his wife thinks that would be best.  + ambulation with walker  +BM  Objective: Vital signs in last 24 hours: Temp:  [98.2 F (36.8 C)-99.5 F (37.5 C)] 98.5 F (36.9 C) (09/06 0447) Pulse Rate:  [71-84] 77 (09/06 0447) Cardiac Rhythm: Heart block (09/06 0728) Resp:  [16-36] 18 (09/06 0548) BP: (110-156)/(67-96) 118/84 (09/06 0548) SpO2:  [91 %-99 %] 92 % (09/06 0447) Weight:  [266 lb 6.4 oz (120.8 kg)] 266 lb 6.4 oz (120.8 kg) (09/06 0447)  Intake/Output from previous day: 09/05 0701 - 09/06 0700 In: 350 [P.O.:350] Out: 860 [Urine:860]  General appearance: alert, cooperative and no distress Heart: regular rate and rhythm Lungs: clear to auscultation bilaterally Abdomen: soft, non-tender; bowel sounds normal; no masses,  no organomegaly Extremities: edema mild pitting Wound: clean and dry  Lab Results:  Recent Labs  09/24/16 0347  WBC 11.5*  HGB 11.6*  HCT 34.5*  PLT 160   BMET:  Recent Labs  09/24/16 0347  NA 140  K 3.6  CL 104  CO2 29  GLUCOSE 107*  BUN 16  CREATININE 1.00  CALCIUM 8.4*    PT/INR:  Recent Labs  09/26/16 0250  LABPROT 16.5*  INR 1.34   ABG    Component Value Date/Time   PHART 7.364 09/20/2016 1952   HCO3 23.7 09/20/2016 1952   TCO2 25 09/21/2016 1616   ACIDBASEDEF 2.0 09/20/2016 1952   O2SAT 93.0 09/20/2016 1952   CBG (last 3)  No results for input(s): GLUCAP in the last 72  hours.  Assessment/Plan: S/P Procedure(s) (LRB): AORTIC VALVE REPLACEMENT (AVR) USING MAGNA EASE 23 MM PERICARDIAL TISSUE HEART VALVE MODEL 3300TFX (N/A) CORONARY ARTERY BYPASS GRAFTING (CABG)x 3 WITH ENDOSCOPIC HARVESTING OF RIGHT SAPHENOUS VEIN (N/A) TRANSESOPHAGEAL ECHOCARDIOGRAM (TEE) (N/A)  1. CV- NSR, continue Amiodarone and Cardizem 2. INR 1.34, iwill continue coumadin at 5 mg daily 3. Pulm- off oxygen, no acute issues continue IS 4. Renal- creatinine stable, weight is trending down, mild edema on exam, will give short course of lasix 5. Deconditioning- Pt recs home health, however patient is wanting to possibly go to SNF 6. Dispo- patient stable, maintaining NSR, continue coumadin, consult social work for possible SNF placement, however per PT notes, may be difficult to get him placed, will tentatively plan to d/c in AM if patient remains stable   LOS: 6 days    Ahmed Prima, Junie Panning 09/26/2016

## 2016-09-26 NOTE — Care Management Important Message (Signed)
Important Message  Patient Details  Name: Joseph [name] MRN: [id number] Date of Birth: 1940-10-19   Medicare Important Message Given:  Yes    Nathen May 09/26/2016, 12:42 PM

## 2016-09-26 NOTE — Discharge Instructions (Addendum)
Home Health 1. Please remove chest tube sutures in 1 week  Aortic Valve Replacement, Care After Refer to this sheet in the next few weeks. These instructions provide you with information about caring for yourself after your procedure. Your health care provider may also give you more specific instructions. Your treatment has been planned according to current medical practices, but problems sometimes occur. Call your health care provider if you have any problems or questions after your procedure. What can I expect after the procedure? After the procedure, it is common to have:  Pain around your incision area.  A small amount of blood or clear fluid coming from your incision.  Follow these instructions at home: Eating and drinking   Follow instructions from your health care provider about eating or drinking restrictions. ? Limit alcohol intake to no more than 1 drink per day for nonpregnant women and 2 drinks per day for men. One drink equals 12 oz of beer, 5 oz of wine, or 1 oz of hard liquor. ? Limit how much caffeine you drink. Caffeine can affect your heart's rate and rhythm.  Drink enough fluid to keep your urine clear or pale yellow.  Eat a heart-healthy diet. This should include plenty of fresh fruits and vegetables. If you eat meat, it should be lean cuts. Avoid foods that are: ? High in salt, saturated fat, or sugar. ? Canned or highly processed. ? Fried. Activity  Return to your normal activities as told by your health care provider. Ask your health care provider what activities are safe for you.  Exercise regularly once you have recovered, as told by your health care provider.  Avoid sitting for more than 2 hours at a time without moving. Get up and move around at least once every 1-2 hours. This helps to prevent blood clots in the legs.  Do not lift anything that is heavier than 10 lb (4.5 kg) until your health care provider approves.  Avoid pushing or pulling things  with your arms until your health care provider approves. This includes pulling on handrails to help you climb stairs. Incision care   Follow instructions from your health care provider about how to take care of your incision. Make sure you: ? Wash your hands with soap and water before you change your bandage (dressing). If soap and water are not available, use hand sanitizer. ? Change your dressing as told by your health care provider. ? Leave stitches (sutures), skin glue, or adhesive strips in place. These skin closures may need to stay in place for 2 weeks or longer. If adhesive strip edges start to loosen and curl up, you may trim the loose edges. Do not remove adhesive strips completely unless your health care provider tells you to do that.  Check your incision area every day for signs of infection. Check for: ? More redness, swelling, or pain. ? More fluid or blood. ? Warmth. ? Pus or a bad smell. Medicines  Take over-the-counter and prescription medicines only as told by your health care provider.  If you were prescribed an antibiotic medicine, take it as told by your health care provider. Do not stop taking the antibiotic even if you start to feel better. Travel  Avoid airplane travel for as long as told by your health care provider.  When you travel, bring a list of your medicines and a record of your medical history with you. Carry your medicines with you. Driving  Ask your health care provider when it  is safe for you to drive. Do not drive until your health care provider approves.  Do not drive or operate heavy machinery while taking prescription pain medicine. Lifestyle   Do not use any tobacco products, such as cigarettes, chewing tobacco, or e-cigarettes. If you need help quitting, ask your health care provider.  Resume sexual activity as told by your health care provider. Do not use medicines for erectile dysfunction unless your health care provider approves, if this  applies.  Work with your health care provider to keep your blood pressure and cholesterol under control, and to manage any other heart conditions that you have.  Maintain a healthy weight. General instructions  Do not take baths, swim, or use a hot tub until your health care provider approves.  Do not strain to have a bowel movement.  Avoid crossing your legs while sitting down.  Check your temperature every day for a fever. A fever may be a sign of infection.  If you are a woman and you plan to become pregnant, talk with your health care provider before you become pregnant.  Wear compression stockings if your health care provider instructs you to do this. These stockings help to prevent blood clots and reduce swelling in your legs.  Tell all health care providers who care for you that you have an artificial (prosthetic) aortic valve. If you have or have had heart disease or endocarditis, tell all health care providers about these conditions as well.  Keep all follow-up visits as told by your health care provider. This is important. Contact a health care provider if:  You develop a skin rash.  You experience sudden, unexplained changes in your weight.  You have more redness, swelling, or pain around your incision.  You have more fluid or blood coming from your incision.  Your incision feels warm to the touch.  You have pus or a bad smell coming from your incision.  You have a fever. Get help right away if:  You develop chest pain that is different from the pain coming from your incision.  You develop shortness of breath or difficulty breathing.  You start to feel light-headed. These symptoms may represent a serious problem that is an emergency. Do not wait to see if the symptoms will go away. Get medical help right away. Call your local emergency services (911 in the [place] Do not drive yourself to the hospital. This information is not intended to replace advice given to  you by your health care provider. Make sure you discuss any questions you have with your health care provider. Document Released: 07/26/2004 Document Revised: 06/15/2015 Document Reviewed: 12/11/2014 Elsevier Interactive Patient Education  2017 [place] Need to Know About Warfarin Warfarin is a blood thinner (anticoagulant). Anticoagulants help to prevent the formation of blood clots. They also help to stop the growth of blood clots. Who should use warfarin? Warfarin is prescribed for people who are at risk for developing harmful blood clots, such as people who have:  Surgically implanted mechanical heart valves.  Irregular heart rhythms (atrial fibrillation).  Certain clotting disorders.  A history of harmful blood clotting in the past. This includes people who have had: ? A stroke. ? Blood clot in the lungs (pulmonary embolism, or PE). ? Blood clot in the legs (deep vein thrombosis, or DVT).  An existing blood clot.  How is warfarin taken?  Warfarin is a medicine that you take by mouth (orally). Warfarin tablets come in different strengths.  Each tablet strength is a different color, with the amount of warfarin printed on the tablet. If you get a new prescription filled and the color of your tablet is different than usual, tell your pharmacist or health care provider immediately. What blood tests do I need while taking warfarin? The goal of warfarin therapy is to lessen the clotting tendency of blood, but not to prevent clotting completely. Your health care provider will monitor the anticoagulation effect of warfarin closely and will adjust your dose as needed. Warfarin is a medicine that needs to be closely monitored, so it is very important to keep all lab visits and follow-up visits with your health care provider. While taking warfarin, you will need to have blood tests (prothrombin tests, or PT tests) regularly to measure your blood clotting time. This type of test can  be done with a finger stick or a blood draw. What does the INR test result mean? The PT test results will be reported as the International Normalized Ratio (INR). The INR tells your health care provider whether your dosage of warfarin needs to be changed. The longer it takes your blood to clot, the higher the INR. Your health care provider will tell you your target INR range. If your INR is not in your target range, your health care provider may adjust your dosage.  If your INR is above your target range, there is a risk of bleeding. Your dosage of warfarin may need to be decreased.  If your INR is below your target range, there is a risk of clotting. Your dosage of warfarin may need to be increased.  How often is the INR test needed?  When you first start warfarin, you will usually have your INR checked every few days.  You may need to have INR tests done more than once a week until you are taking the correct dosage of warfarin.  After you have reached your target INR, your INR will be tested less often. However, you will need to have your INR checked at least once every 4-6 weeks for the entire time you are taking warfarin. What are the side effects of warfarin? Too much warfarin can cause bleeding (hemorrhage) in any part of the body, such as:  Bleeding from the gums.  Unexplained bruises.  Bruises that get larger.  Blood in the urine.  Bloody or dark stools.  Bleeding in the brain (hemorrhagic stroke).  A nosebleed that is not easily stopped.  Coughing up blood.  Vomiting blood.  Warfarin use may also cause:  Skin rash or irritations  Nausea that does not go away.  Severe pain in the back or joints.  Painful toes that turn blue or purple (purple toe syndrome).  Painful ulcers that do not go away (skin necrosis).  What are the signs and symptoms of a blood clot? Too little warfarin can increase the risk of blood clots in your legs, lungs, or arms. Signs and  symptoms of a DVT in your leg or arm may include:  Pain or swelling in your leg or arm.  Skin that is red or warm to the touch on your arm or leg.  Signs and symptoms of a pulmonary embolism may include:  Shortness of breath or difficulty breathing.  Chest pain.  Unexplained fever.  What are the signs and symptoms of a stroke? If you are taking too much or too little warfarin, you can have a stroke. Signs and symptoms of a stroke may include:  Weakness  or numbness of your face, arm, or leg, especially on one side of your body.  Confusion or trouble thinking clearly.  Difficulty seeing with one or both eyes.  Difficulty walking or moving your arms or legs.  Dizziness.  Loss of balance or coordination.  Trouble speaking, trouble understanding speech, or both (aphasia).  Sudden, severe headache with no known cause.  Partial or total loss of consciousness.  What precautions do I need to take while using warfarin?   Take warfarin exactly as told by your health care provider. Doing this helps you avoid bleeding or blood clots that could result in serious injury, pain, or disability.  Take your medicine at the same time every day. If you forget to take your dose of warfarin, take it as soon as you remember that day. If you do not remember on that day, do not take an extra dose the next day.  Contact your health care provider if you miss or take an extra dose. Do not change your dosage on your own to make up for missed or extra doses.  Wear or carry identification that says that you are taking warfarin.  Make sure that all health care providers, including your dentist, know you are taking warfarin.  If you need surgery, talk with your health care provider about whether you should stop taking warfarin before your surgery.  Avoid situations that cause bleeding. You may bleed more easily while taking warfarin. To limit bleeding, take the following actions: ? Use a softer  toothbrush. ? Floss with waxed floss, not unwaxed floss. ? Shave with an electric razor, not with a blade. ? Limit your use of sharp objects. ? Avoid potentially harmful activities, such as contact sports. What do I need to know about warfarin and pregnancy or breastfeeding?  Warfarin is not recommended during the first trimester of pregnancy due to an increased risk of birth defects. In certain situations, a woman may take warfarin after her first trimester of pregnancy.  If you are taking warfarin and you become pregnant or plan to become pregnant, contact your health care provider right away.  If you plan to breastfeed while taking warfarin, talk with your health care provider first. What do I need to know about warfarin and alcohol or drug use?  Avoid drinking alcohol, or limit alcohol intake to no more than 1 drink a day for nonpregnant women and 2 drinks a day for men. One drink equals 12 oz of beer, 5 oz of wine, or 1 oz of hard liquor. ? If you change the amount of alcohol that you drink, tell your health care provider. Your warfarin dosage may need to be changed.    Avoid street drugs while taking warfarin. The effects of street drugs on warfarin are not known. What do I need to know about warfarin and other medicines or supplements?  Many prescription and over-the-counter medicines can interfere with warfarin. Talk with your health care provider or your pharmacist before starting or stopping any new medicines. This includes over-the-counter vitamins, dietary supplements, herbal medicines, and pain medicines. Your warfarin dosage may need to be adjusted.  Some common over-the-counter medicines that may increase the risk of bleeding while taking warfarin include: ? Acetaminophen. ?  ? NSAIDs, such as ibuprofen or naproxen. ? Vitamin E. What do I need to know about warfarin and my diet?  It is important to maintain a normal, balanced diet while taking warfarin. Avoid major  changes in your diet. If you are  going to change your diet, talk with your health care provider before making changes.  Your health care provider may recommend that you work with a diet and nutrition specialist (dietitian).  Vitamin K decreases the effect of warfarin, and it is found in many foods. Eat a consistent amount of foods that contain vitamin K. For example, you may decide to eat 2 vitamin K-containing foods each day. Most foods that are high in vitamin K are green and leafy. Common foods that contain high amounts of vitamin K include:  Kale, raw or cooked.  Spinach, raw or cooked.  Collards, raw or cooked.  Swiss chard, raw or cooked.  Mustard greens, raw or cooked.  Turnip greens, raw or cooked.  Parsley, raw.  Broccoli, cooked.  Noodles, eggs, and spinach, enriched.  Brussels sprouts, raw or cooked.  Beet greens, raw or cooked.  Endive, raw.  Cabbage, cooked.  Asparagus, cooked.  Foods that contain moderate amounts of vitamin K include:  Broccoli, raw.  Cabbage, raw.  Bok choy, cooked.  Green leaf lettuce, raw  Prunes, stewed.  Angie Fava.  Kiwi.  Edamame, cooked.  Romaine lettuce, raw.  Avocado.  Tuna, canned in oil.  Okra, cooked.  Black-eyed peas, cooked.  Green beans, cooked or raw.  Blueberries, raw.  Blackberries, raw.  Peas, cooked or raw.  Contact a health care provider if:  You miss a dose.  You take an extra dose.  You plan to have any kind of surgery or procedure.  You are unable to take your medicine due to nausea, vomiting, or diarrhea.  You have any major changes in your diet or you plan to make any major changes in your diet.  You start or stop any over-the-counter medicine, prescription medicine, or dietary supplement.  You become pregnant, plan to become pregnant, or think you may be pregnant.  You have menstrual periods that are heavier than usual.  You have unusual bruising. Get help right away  if:  You develop symptoms of an allergic reaction, such as: ? Swelling of the lips, face, tongue, mouth, or throat. ? Rash. ? Itching. ? Itchy, red, swollen areas of skin (hives). ? Trouble breathing. ? Chest tightness.  You have: ? Signs or symptoms of a stroke. ? Signs or symptoms of a blood clot. ? A fall or have an accident, especially if you hit your head. ? Blood in your urine. Your urine may look reddish, pinkish, or tea-colored. ? Blood in your stool. Your stool may be black or bright red. ? Bleeding that does not stop after applying pressure to the area for 30 minutes. ? Severe pain in your joints or back. ? Purple or blue toes. ? Skin ulcers that do not go away.  You vomit blood or cough up blood. The blood may be bright red, or it may look like coffee grounds. These symptoms may represent a serious problem that is an emergency. Do not wait to see if the symptoms will go away. Get medical help right away. Call your local emergency services (911 in the [place] Do not drive yourself to the hospital. Summary  Warfarin needs to be closely monitored with blood tests. It is very important to keep all lab visits and follow-up visits with your health care provider.  Make sure that you know your target INR range and your warfarin dosage.  Wear or carry identification that says that you are taking warfarin.  Take warfarin at the same time every day. Call your health care  provider if you miss a dose or if you take an extra dose. Do not change the dosage of warfarin on your own.  Know the signs and symptoms of blood clots, bleeding, and a stroke. Know when to get emergency medical help.  Tell all health care providers who care for you that you are taking warfarin.  Talk with your health care provider or your pharmacist before starting or stopping any new medicines.  Monitor how much vitamin K you eat every day. Try to eat the same amount every day. This information is not  intended to replace advice given to you by your health care provider. Make sure you discuss any questions you have with your health care provider. Document Released: 01/07/2005 Document Revised: 09/19/2015 Document Reviewed: 04/05/2015 Elsevier Interactive Patient Education  2017 [employer] Coronary Artery Bypass Grafting, Care After These instructions give you information on caring for yourself after your procedure. Your doctor may also give you more specific instructions. Call your doctor if you have any problems or questions after your procedure. Follow these instructions at home:  Only take medicine as told by your doctor. Take medicines exactly as told. Do not stop taking medicines or start any new medicines without talking to your doctor first.  Take your pulse as told by your doctor.  Do deep breathing as told by your doctor. Use your breathing device (incentive spirometer), if given, to practice deep breathing several times a day. Support your chest with a pillow or your arms when you take deep breaths or cough.  Keep the area clean, dry, and protected where the surgery cuts (incisions) were made. Remove bandages (dressings) only as told by your doctor. If strips were applied to surgical area, do not take them off. They fall off on their own.  Check the surgery area daily for puffiness (swelling), redness, or leaking fluid.  If surgery cuts were made in your legs: ? Avoid crossing your legs. ? Avoid sitting for long periods of time. Change positions every 30 minutes. ? Raise your legs when you are sitting. Place them on pillows.  Wear stockings that help keep blood clots from forming in your legs (compression stockings).  Only take sponge baths until your doctor says it is okay to take showers. Pat the surgery area dry. Do not rub the surgery area with a washcloth or towel. Do not bathe, swim, or use a hot tub until your doctor says it is okay.  Eat foods that are high in fiber.  These include raw fruits and vegetables, whole grains, beans, and nuts. Choose lean meats. Avoid canned, processed, and fried foods.  Drink enough fluids to keep your pee (urine) clear or pale yellow.  Weigh yourself every day.  Rest and limit activity as told by your doctor. You may be told to: ? Stop any activity if you have chest pain, shortness of breath, changes in heartbeat, or dizziness. Get help right away if this happens. ? Move around often for short amounts of time or take short walks as told by your doctor. Gradually become more active. You may need help to strengthen your muscles and build endurance. ? Avoid lifting, pushing, or pulling anything heavier than 10 pounds (4.5 kg) for at least 6 weeks after surgery.  Do not drive until your doctor says it is okay.  Ask your doctor when you can go back to work.  Ask your doctor when you can begin sexual activity again.  Follow up with your doctor as  told. Contact a doctor if:  You have puffiness, redness, more pain, or fluid draining from the incision site.  You have a fever.  You have puffiness in your ankles or legs.  You have pain in your legs.  You gain 2 or more pounds (0.9 kg) a day.  You feel sick to your stomach (nauseous) or throw up (vomit).  You have watery poop (diarrhea). Get help right away if:  You have chest pain that goes to your jaw or arms.  You have shortness of breath.  You have a fast or irregular heartbeat.  You notice a "clicking" in your breastbone when you move.  You have numbness or weakness in your arms or legs.  You feel dizzy or light-headed. This information is not intended to replace advice given to you by your health care provider. Make sure you discuss any questions you have with your health care provider. Document Released: 01/12/2013 Document Revised: 06/15/2015 Document Reviewed: 06/16/2012 Elsevier Interactive Patient Education  2017 [place] on my  medicine - Coumadin   (Warfarin)  This medication education was reviewed with me or my healthcare representative as part of my discharge preparation.  The pharmacist that spoke with me during my hospital stay was:  Tad Moore, [facility]  Why was Coumadin prescribed for you? Coumadin was prescribed for you because you have a blood clot or a medical condition that can cause an increased risk of forming blood clots. Blood clots can cause serious health problems by blocking the flow of blood to the heart, lung, or brain. Coumadin can prevent harmful blood clots from forming. As a reminder your indication for Coumadin is:   Blood Clot Prevention After Heart Valve Surgery  What test will check on my response to Coumadin? While on Coumadin (warfarin) you will need to have an INR test regularly to ensure that your dose is keeping you in the desired range. The INR (international normalized ratio) number is calculated from the result of the laboratory test called prothrombin time (PT).  If an INR APPOINTMENT HAS NOT ALREADY BEEN MADE FOR YOU please schedule an appointment to have this lab work done by your health care provider within 7 days. Your INR goal is usually a number between:  2 to 3 or your provider may give you a more narrow range like 2-2.5.  Ask your health care provider during an office visit what your goal INR is.  What  do you need to  know  About  COUMADIN? Take Coumadin (warfarin) exactly as prescribed by your healthcare provider about the same time each day.  DO NOT stop taking without talking to the doctor who prescribed the medication.  Stopping without other blood clot prevention medication to take the place of Coumadin may increase your risk of developing a new clot or stroke.  Get refills before you run out.  What do you do if you miss a dose? If you miss a dose, take it as soon as you remember on the same day then continue your regularly scheduled regimen the next day.  Do not take  two doses of Coumadin at the same time.  Important Safety Information A possible side effect of Coumadin (Warfarin) is an increased risk of bleeding. You should call your healthcare provider right away if you experience any of the following: ? Bleeding from an injury or your nose that does not stop. ? Unusual colored urine (red or dark brown) or unusual colored stools (red or  black). ? Unusual bruising for unknown reasons. ? A serious fall or if you hit your head (even if there is no bleeding).  Some foods or medicines interact with Coumadin (warfarin) and might alter your response to warfarin. To help avoid this: ? Eat a balanced diet, maintaining a consistent amount of Vitamin K. ? Notify your provider about major diet changes you plan to make. ? Avoid alcohol or limit your intake to 1 drink for women and 2 drinks for men per day. (1 drink is 5 oz. wine, 12 oz. beer, or 1.5 oz. liquor.)  Make sure that ANY health care provider who prescribes medication for you knows that you are taking Coumadin (warfarin).  Also make sure the healthcare provider who is monitoring your Coumadin knows when you have started a new medication including herbals and non-prescription products.  Coumadin (Warfarin)  Major Drug Interactions  Increased Warfarin Effect Decreased Warfarin Effect  Alcohol (large quantities) Antibiotics (esp. Septra/Bactrim, Flagyl, Cipro) Amiodarone (Cordarone) Aspirin (ASA) Cimetidine (Tagamet) Megestrol (Megace) NSAIDs (ibuprofen, naproxen, etc.) Piroxicam (Feldene) Propafenone (Rythmol SR) Propranolol (Inderal) Isoniazid (INH) Posaconazole (Noxafil) Barbiturates (Phenobarbital) Carbamazepine (Tegretol) Chlordiazepoxide (Librium) Cholestyramine (Questran) Griseofulvin Oral Contraceptives Rifampin Sucralfate (Carafate) Vitamin K   Coumadin (Warfarin) Major Herbal Interactions  Increased Warfarin Effect Decreased Warfarin Effect  Garlic Ginseng Ginkgo biloba  Coenzyme Q10 Green tea St. Johns wort    Coumadin (Warfarin) FOOD Interactions  Eat a consistent number of servings per week of foods HIGH in Vitamin K (1 serving =  cup)  Collards (cooked, or boiled & drained) Kale (cooked, or boiled & drained) Mustard greens (cooked, or boiled & drained) Parsley *serving size only =  cup Spinach (cooked, or boiled & drained) Swiss chard (cooked, or boiled & drained) Turnip greens (cooked, or boiled & drained)  Eat a consistent number of servings per week of foods MEDIUM-HIGH in Vitamin K (1 serving = 1 cup)  Asparagus (cooked, or boiled & drained) Broccoli (cooked, boiled & drained, or raw & chopped) Brussel sprouts (cooked, or boiled & drained) *serving size only =  cup Lettuce, raw (green leaf, endive, romaine) Spinach, raw Turnip greens, raw & chopped   These websites have more information on Coumadin (warfarin):  FailFactory.se; VeganReport.com.au;

## 2016-09-26 NOTE — Progress Notes (Signed)
CARDIAC REHAB PHASE I   PRE:  Rate/Rhythm: 81 SR  BP:  Supine:   Sitting: 141/79  Standing:    SaO2: 92%RA  MODE:  Ambulation: 470 ft   POST:  Rate/Rhythm: 93 SR  BP:  Supine:   Sitting: 136/78  Standing:    SaO2: 93%RA 1015-1118 Pt walked 470 ft with rolling walker, stopping a couple of times to catch his breath. His knees limit distance. He has a rollator at home that he can use to rest and walk farther. Wife and pt had lots of questions so I went ahead and did ed. Discussed sternal precautions, IS, ex ed and heart healthy diet watching carbs, CRP 2.  Will refer to [facility] program. Put on discharge video for pt to view. Needs to see pharmacist re Coumadin ed. Pt and wife are in agreement for pt to go home since he is doing well with walking.   Graylon Good, RN BSN  09/26/2016 11:13 AM

## 2016-09-27 LAB — PROTIME-INR
INR: 1.96
Prothrombin Time: 22.1 seconds — ABNORMAL HIGH (ref 11.4–15.2)

## 2016-09-27 MED ORDER — TRAMADOL HCL 50 MG PO TABS: 50.0000 mg | ORAL_TABLET | ORAL | 0 refills | Status: DC | PRN

## 2016-09-27 MED ORDER — ATORVASTATIN CALCIUM 10 MG PO TABS: 10.0000 mg | ORAL_TABLET | Freq: Every day | ORAL | 3 refills | Status: DC

## 2016-09-27 MED ORDER — DILTIAZEM HCL 60 MG PO TABS: 60.0000 mg | ORAL_TABLET | Freq: Four times a day (QID) | ORAL | 3 refills | Status: DC

## 2016-09-27 MED ORDER — AMIODARONE HCL 400 MG PO TABS: 400.0000 mg | ORAL_TABLET | Freq: Two times a day (BID) | ORAL | 1 refills | Status: DC

## 2016-09-27 MED ORDER — WARFARIN SODIUM 4 MG PO TABS: 4.0000 mg | ORAL_TABLET | Freq: Every day | ORAL | 3 refills | Status: DC

## 2016-09-27 NOTE — Progress Notes (Signed)
Discharge instructions, RX's and follow up appts explained and provided to patient and wife verbalized understanding. Allen set up for patient contact information provided. Volunteers called to take patient to out no c/o pain or shortness of breath at d/c.  Lucian Baswell, Tivis Ringer, RN

## 2016-09-27 NOTE — Progress Notes (Signed)
Physical Therapy Treatment Patient Details Name: Joseph [name] MRN: [id number] DOB: Jun 08, 1940 Today's Date: 09/27/2016    History of Present Illness Patient is a 76 yo male admitted 09/20/16 with severe AS, and severe CAD.  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     PMH:  hypertension, obesity, normal pressure hydrocephalus s/p VP shunt in 2016, arthritis, rupture of bilateral quadriceps tendons requiring surgical repair and aortic stenosis that was severe     PT Comments    Pt is continuing to progress towards his goals. Pt currently mod I for transfers and supervision for ambulation of 470 feet without AD. Pt safe and steady with gait but educated on the benefit of using assistive device to aid in energy conservation. Pt requires skilled PT to progress his gait and improve his strength and endurance to be able to safely navigate in his home environment at discharge.     Follow Up Recommendations  Home health PT;Supervision for mobility/OOB     Equipment Recommendations  Rolling walker with 5" wheels;3in1 (PT)    Recommendations for Other Services OT consult     Precautions / Restrictions Precautions Precautions: Sternal;Fall Restrictions Weight Bearing Restrictions: Yes (Sternal precautions) Other Position/Activity Restrictions: Sternal precautions    Mobility  Bed Mobility               General bed mobility comments: up in bathroom at entry  Transfers Overall transfer level: Needs assistance Equipment used: Rolling walker (2 wheeled) (Eva walker) Transfers: Sit to/from Stand Sit to Stand: Modified independent (Device/Increase time)         General transfer comment: holds pillow with sit to stand from toilet  Ambulation/Gait Ambulation/Gait assistance: Supervision   Assistive device: None Gait Pattern/deviations: Step-through pattern;Decreased step length - right;Decreased step length - left;Shuffle Gait velocity: decreased Gait velocity interpretation:  Below normal speed for age/gender General Gait Details: pt requested trying to ambulate without assistive device, pt with steady, safe cadence requiring 3x standing rest breaks due to 3/4 DoE, at each break HR ranged from 100-101 bpm and SaO2 on RA 90-93%O2, Pt educated on energy conservation techniques including use of AD, and use of pursed lipped breathing to increase depth of breath      Balance Overall balance assessment: Needs assistance Sitting-balance support: No upper extremity supported Sitting balance-Leahy Scale: Good     Standing balance support: No upper extremity supported;During functional activity Standing balance-Leahy Scale: Good                              Cognition Arousal/Alertness: Awake/alert Behavior During Therapy: WFL for tasks assessed/performed Overall Cognitive Status: Within Functional Limits for tasks assessed                                           General Comments General comments (skin integrity, edema, etc.): Wife present during session      Pertinent Vitals/Pain Faces Pain Scale: Hurts a little bit Pain Location: Sternal incision site Pain Descriptors / Indicators: Sore;Grimacing Pain Intervention(s): Monitored during session           PT Goals (current goals can now be found in the care plan section) Acute Rehab PT Goals Patient Stated Goal: To walk better PT Goal Formulation: With patient Time For Goal Achievement: 09/29/16 Potential to Achieve Goals: Good Progress towards PT goals:  Progressing toward goals    Frequency    Min 3X/week      PT Plan Current plan remains appropriate       AM-PAC PT "6 Clicks" Daily Activity  Outcome Measure  Difficulty turning over in bed (including adjusting bedclothes, sheets and blankets)?: Unable Difficulty moving from lying on back to sitting on the side of the bed? : Unable Difficulty sitting down on and standing up from a chair with arms (e.g.,  wheelchair, bedside commode, etc,.)?: Unable Help needed moving to and from a bed to chair (including a wheelchair)?: A Little Help needed walking in hospital room?: A Little Help needed climbing 3-5 steps with a railing? : A Lot 6 Click Score: 11    End of Session Equipment Utilized During Treatment: Gait belt Activity Tolerance: Patient limited by fatigue Patient left: with call bell/phone within reach;in bed;with family/visitor present Nurse Communication: Mobility status PT Visit Diagnosis: Other abnormalities of gait and mobility (R26.89);Unsteadiness on feet (R26.81);Muscle weakness (generalized) (M62.81);Pain Pain - part of body:  (Sternal incision site)     Time: 0300-9233 PT Time Calculation (min) (ACUTE ONLY): 19 min  Charges:  $Gait Training: 8-22 mins                    G Codes:       Login Muckleroy B. Migdalia Dk PT, DPT Acute Rehabilitation  [phone] Pager [phone]     [place] 09/27/2016, 9:38 AM

## 2016-09-27 NOTE — Consult Note (Signed)
[facility] CM Primary Care Navigator  09/27/2016  Joseph [name] 09/02/1940 [id number]   Met with patient and wife(Joseph [name]) at the bedside to identify possible discharge needs. Patient reports having shortness of breath with exertion and during longer walks that had led to this admission/ surgery. PatientendorsesDr. Robert Ehingerashis primary care provider with [facility] [place].    Patient shared using [facility] pharmacy on [address] to obtain medications without any problem.  Patient reports managing his medications at home using "pill box" system filled every week.  Patient has been driving prior to admission/ surgery but wife will be providingtransportation to his doctors' appointments after discharge.  He states that he can also use [facility] transportation if needed.  Patient lives with wife at [facility] who serves as his primary caregiver.  Anticipated discharge plan is home- back to [facility] with home health services per therapy recommendation.  Patient and wife expressedunderstanding to callprimary care provider's officewhen he gets home,to schedulea post discharge follow-upwithin a week or sooner if needed. Patient letter (with primary care provider'scontact number) was provided as a reminder.  Explained to patient and wiferegardingTHN CM services available for health management buthe denied any needs or concerns at this time. However, he opted and verbally agreed to [facility]MI calls for follow-up with his recovery at home. Referral for EMMI General calls made.  Patient and wife voicedunderstandingto seek referral from primary careprovider to [facility] care management if deemed appropriatefor services in the future.  [facility] care management information provided for future needs that he may have.  For questions, please contact:  Lorraine Ajel, BSN, RN- BC Primary Care Navigator  Telephone: [phone] [facility] 

## 2016-09-27 NOTE — Care Management Note (Signed)
Case Management Note Marvetta Gibbons RN, BSN Unit 4E-Case Manager--2H coverage [phone]  Patient Details  Name: Joseph [name] MRN: [id number] Date of Birth: 05/07/1940  Subjective/Objective:   Pt admitted s/p CABGx3 and AVR  09/20/16              Action/Plan: PTA pt lived at home with spouse- CM to follow for d/c needs-  Post op day 4- pt with post op afib  Expected Discharge Date:  09/27/16               Expected Discharge Plan:  [place]  In-House Referral:  NA  Discharge planning Services  CM Consult  Post Acute Care Choice:  Home Health Choice offered to:  Spouse, Patient  DME Arranged:  N/A DME Agency:  NA  HH Arranged:  RN, PT [place] Agency:  [place]  Status of Service:  Completed, signed off  If discussed at [place] of Stay Meetings, dates discussed:    Discharge Disposition: home/home health   Additional Comments:  09/27/16- 1000- Draven Natter RN, CM- pt for d/c home today with [facility] orders for RN/PT- spoke with pt and wife at bedside- choice offered for [facility] agencies in [employer]- per pt and wife- they have no preference and are ok with using [facility]- pt states that he has both rollator and cane at home- referral called to Santiago Glad with [facility] for HHRN/PT-   Dahlia Client, [employer], RN 09/27/2016, 10:02 AM

## 2016-09-27 NOTE — Progress Notes (Addendum)
      [address]       [place] [address]             [phone]      7 Days Post-Op Procedure(s) (LRB): AORTIC VALVE REPLACEMENT (AVR) USING MAGNA EASE 23 MM PERICARDIAL TISSUE HEART VALVE MODEL 3300TFX (N/A) CORONARY ARTERY BYPASS GRAFTING (CABG)x 3 WITH ENDOSCOPIC HARVESTING OF RIGHT SAPHENOUS VEIN (N/A) TRANSESOPHAGEAL ECHOCARDIOGRAM (TEE) (N/A)   Subjective:  Joseph [name] has no new complaints.  He is happy to be going home today.  Objective: Vital signs in last 24 hours: Temp:  [98.2 F (36.8 C)-99.7 F (37.6 C)] 98.2 F (36.8 C) (09/07 0339) Pulse Rate:  [79-91] 79 (09/06 2026) Cardiac Rhythm: Normal sinus rhythm (09/06 2200) Resp:  [22-26] 26 (09/06 2026) BP: (124-137)/(65-94) 130/72 (09/07 0502) SpO2:  [92 %-94 %] 94 % (09/06 2026) Weight:  [262 lb 4.8 oz (119 kg)] 262 lb 4.8 oz (119 kg) (09/07 0339)  Intake/Output from previous day: 09/06 0701 - 09/07 0700 In: 1100 [P.O.:1100] Out: 1325 [Urine:1325]  General appearance: alert, cooperative and no distress Heart: regular rate and rhythm Lungs: clear to auscultation bilaterally Abdomen: soft, non-tender; bowel sounds normal; no masses,  no organomegaly Extremities: edema trace Wound: clean and dry  Lab Results: No results for input(s): WBC, HGB, HCT, PLT in the last 72 hours. BMET: No results for input(s): NA, K, CL, CO2, GLUCOSE, BUN, CREATININE, CALCIUM in the last 72 hours.  PT/INR:  Recent Labs  09/27/16 0332  LABPROT 22.1*  INR 1.96   ABG    Component Value Date/Time   PHART 7.364 09/20/2016 1952   HCO3 23.7 09/20/2016 1952   TCO2 25 09/21/2016 1616   ACIDBASEDEF 2.0 09/20/2016 1952   O2SAT 93.0 09/20/2016 1952   CBG (last 3)  No results for input(s): GLUCAP in the last 72 hours.  Assessment/Plan: S/P Procedure(s) (LRB): AORTIC VALVE REPLACEMENT (AVR) USING MAGNA EASE 23 MM PERICARDIAL TISSUE HEART VALVE MODEL 3300TFX (N/A) CORONARY ARTERY BYPASS GRAFTING (CABG)x 3 WITH  ENDOSCOPIC HARVESTING OF RIGHT SAPHENOUS VEIN (N/A) TRANSESOPHAGEAL ECHOCARDIOGRAM (TEE) (N/A)  1. CV- PAF, maintaining NSR- continue Amiodarone, cardizem 2. INR 1.96, big jump from 1.34, will decrease coumadin to 4 mg daily 3. Pulm- no acute issues, continue IS 4. Renal- creatinine has been stable, weight is trending down... Will give lasix today prior to discharge 5. Dispo- patient stable, will d/c home today, H/H arrangements have been placed    LOS: 7 days    BARRETT, ERIN 09/27/2016   Chart reviewed, patient examined, agree with above.

## 2016-10-01 ENCOUNTER — Ambulatory Visit (INDEPENDENT_AMBULATORY_CARE_PROVIDER_SITE_OTHER): Payer: PPO | Admitting: *Deleted

## 2016-10-01 ENCOUNTER — Telehealth (HOSPITAL_COMMUNITY): Payer: Self-pay

## 2016-10-01 DIAGNOSIS — Z952 Presence of prosthetic heart valve: Secondary | ICD-10-CM

## 2016-10-01 DIAGNOSIS — Z5181 Encounter for therapeutic drug level monitoring: Secondary | ICD-10-CM

## 2016-10-01 DIAGNOSIS — Z951 Presence of aortocoronary bypass graft: Secondary | ICD-10-CM | POA: Diagnosis not present

## 2016-10-01 LAB — POCT INR: INR: 5.1

## 2016-10-01 NOTE — Patient Instructions (Signed)

## 2016-10-01 NOTE — Telephone Encounter (Signed)
Patient insurance is active and benefits verified. Patient insurance is [employer] - $15.00 co-pay, no deductible, out of pocket $3400/$320.77 has been met, no co-insurance and no pre-authorization. Spoke with Sylvia @ [employer] on 10/01/16 reference #[id number].  ° °Patient will be contacted and scheduled after their follow up appointment with the cardiologist on 10/15/16 and surgeon on 10/30/16, upon review by Carlette RN navigator.  ° °

## 2016-10-05 ENCOUNTER — Emergency Department (HOSPITAL_BASED_OUTPATIENT_CLINIC_OR_DEPARTMENT_OTHER)
Admit: 2016-10-05 | Discharge: 2016-10-05 | Disposition: A | Discharge status: Home / Self Care | Payer: PPO | Attending: Physician Assistant | Admitting: Physician Assistant

## 2016-10-05 ENCOUNTER — Encounter (HOSPITAL_COMMUNITY): Payer: Self-pay | Admitting: Emergency Medicine

## 2016-10-05 ENCOUNTER — Emergency Department (HOSPITAL_COMMUNITY)
Admission: EM | Admit: 2016-10-05 | Discharge: 2016-10-05 | Disposition: A | Discharge status: Home / Self Care | Payer: PPO | Attending: Emergency Medicine | Admitting: Emergency Medicine

## 2016-10-05 DIAGNOSIS — Z7982 Long term (current) use of aspirin: Secondary | ICD-10-CM | POA: Diagnosis not present

## 2016-10-05 DIAGNOSIS — Z951 Presence of aortocoronary bypass graft: Secondary | ICD-10-CM | POA: Insufficient documentation

## 2016-10-05 DIAGNOSIS — Z79899 Other long term (current) drug therapy: Secondary | ICD-10-CM | POA: Insufficient documentation

## 2016-10-05 DIAGNOSIS — M79609 Pain in unspecified limb: Secondary | ICD-10-CM

## 2016-10-05 DIAGNOSIS — I10 Essential (primary) hypertension: Secondary | ICD-10-CM | POA: Diagnosis not present

## 2016-10-05 DIAGNOSIS — M79605 Pain in left leg: Secondary | ICD-10-CM | POA: Insufficient documentation

## 2016-10-05 DIAGNOSIS — Z7901 Long term (current) use of anticoagulants: Secondary | ICD-10-CM | POA: Diagnosis not present

## 2016-10-05 DIAGNOSIS — Z87891 Personal history of nicotine dependence: Secondary | ICD-10-CM | POA: Diagnosis not present

## 2016-10-05 LAB — BASIC METABOLIC PANEL
ANION GAP: 8 (ref 5–15)
BUN: 11 mg/dL (ref 6–20)
CALCIUM: 9.4 mg/dL (ref 8.9–10.3)
CO2: 23 mmol/L (ref 22–32)
Chloride: 108 mmol/L (ref 101–111)
Creatinine, Ser: 1.19 mg/dL (ref 0.61–1.24)
GFR calc Af Amer: >60 mL/min (ref >60)
GFR, EST NON AFRICAN AMERICAN: 58 mL/min — AB (ref >60)
GLUCOSE: 98 mg/dL (ref 65–99)
Potassium: 4.7 mmol/L (ref 3.5–5.1)
Sodium: 139 mmol/L (ref 135–145)

## 2016-10-05 LAB — CBC
HEMATOCRIT: 39.9 % (ref 39.0–52.0)
Hemoglobin: 13 g/dL (ref 13.0–17.0)
MCH: 30.5 pg (ref 26.0–34.0)
MCHC: 32.6 g/dL (ref 30.0–36.0)
MCV: 93.7 fL (ref 78.0–100.0)
PLATELETS: 454 10*3/uL — AB (ref 150–400)
RBC: 4.26 MIL/uL (ref 4.22–5.81)
RDW: 13.7 % (ref 11.5–15.5)
WBC: 9.6 10*3/uL (ref 4.0–10.5)

## 2016-10-05 LAB — PROTIME-INR
INR: 2.19
PROTHROMBIN TIME: 24.1 s — AB (ref 11.4–15.2)

## 2016-10-05 NOTE — ED Provider Notes (Signed)
  Face-to-face evaluation   History: He is here for evaluation of intermittent left thigh pain noticed today.  Last about 10 minutes.  He does not know what causes it.  He is recovering from CABG.  Physical exam: Alert elderly man who is comfortable.  Left leg nontender palpation, normal range of motion hips knees and ankle, left.  Normal perfusion left leg.  Normal sensation left leg.  Medical screening examination/treatment/procedure(s) were conducted as a shared visit with non-physician practitioner(s) and myself.  I personally evaluated the patient during the encounter    Daleen Bo, MD 10/09/16 1626

## 2016-10-05 NOTE — ED Provider Notes (Signed)
[facility] DEPT Provider Note   CSN: [id number] Arrival date & time: 10/05/16  1540     History   Chief Complaint Chief Complaint  Patient presents with  . Leg Pain    HPI Joseph [name] is a 76 y.o. male presenting with left upper leg pain.  Patient states the pain began today. It is intermittent, lasting for a few seconds before resolving. It is described as being sharp. It is not associated with movement or walking. He has never had pain like this before. He denies any numbness or tingling. He denies swelling of the leg. He denies fall, trauma, or injury. He had open heart surgery 1 week ago, and was concerned about a blood clot. He was placed on warfarin after the surgery, but at last check, levels were too high, so he went several days without taking it. He is now taking half a pill. Levels to be rechecked on Tuesday. Patient denies pain on the other side. He denies radiation of the pain. He has not taken anything for pain. It is not reproducible. He denies chest pain, shortness of breath, or chest tightness. He denies complication from the surgery.   HPI  Past Medical History:  Diagnosis Date  . Aortic stenosis   . Arthritis   . BPH (benign prostatic hyperplasia)   . Depression   . Diverticulosis   . Dyspnea   . Family history of adverse reaction to anesthesia    Pts mother had a difficult time awakening after anesthesia  . GERD (gastroesophageal reflux disease)   . Heart murmur   . Hypertension   . 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 Pre-diabetes   . Rupture of left quadriceps tendon 07/21/2014  . Skin cancer, basal cell   . Urinary incontinence     Patient Active Problem List   Diagnosis Date Noted  . Encounter for therapeutic drug monitoring 10/01/2016  . S/P AVR 09/26/2016  . Atrial fibrillation ([place]) 09/26/2016  . 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 Rupture of left quadriceps tendon 07/21/2014  . Abnormal nuclear stress test  07/20/2014  . Morbid obesity ([place]) 07/20/2014  . Aortic stenosis 07/20/2014  . S/P ventriculoperitoneal shunt 03/03/2014  . Pre-operative cardiovascular examination, valvular heart disease 02/22/2014  . 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 HTN (hypertension) 11/18/2013    Past Surgical History:  Procedure Laterality Date  . ANAL FISSURE REPAIR    . AORTIC VALVE REPLACEMENT N/A 09/20/2016   Procedure: AORTIC VALVE REPLACEMENT (AVR) USING MAGNA EASE 23 MM PERICARDIAL TISSUE HEART VALVE MODEL 3300TFX;  Surgeon: Gaye Pollack, MD;  Location: [facility];  Service: Open Heart Surgery;  Laterality: N/A;  . BACK SURGERY    . CARDIAC CATHETERIZATION    . COLONOSCOPY W/ POLYPECTOMY    . CORONARY ARTERY BYPASS GRAFT N/A 09/20/2016   Procedure: CORONARY ARTERY BYPASS GRAFTING (CABG)x 3 WITH ENDOSCOPIC HARVESTING OF RIGHT SAPHENOUS VEIN;  Surgeon: Gaye Pollack, MD;  Location: [facility];  Service: Open Heart Surgery;  Laterality: N/A;  . ELBOW SURGERY Right   . FINGER SURGERY    . left shoulder RTC tear    . LEG SURGERY     tendon repair right leg  . MICRODISCECTOMY LUMBAR     l5-s1  . QUADRICEPS TENDON REPAIR Left 07/21/2014   Procedure: REPAIR LEFT QUADRICEP TENDON;  Surgeon: Marchia Bond, MD;  Location: [facility];  Service: Orthopedics;  Laterality: Left;  . RIGHT/LEFT  HEART CATH AND CORONARY ANGIOGRAPHY N/A 09/03/2016   Procedure: RIGHT/LEFT HEART CATH AND CORONARY ANGIOGRAPHY;  Surgeon: Jettie Booze, MD;  Location: [facility];  Service: Cardiovascular;  Laterality: N/A;  . TEE WITHOUT CARDIOVERSION N/A 09/20/2016   Procedure: TRANSESOPHAGEAL ECHOCARDIOGRAM (TEE);  Surgeon: Gaye Pollack, MD;  Location: [facility];  Service: Open Heart Surgery;  Laterality: N/A;  . TONSILLECTOMY    . 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   Procedure: SHUNT INSERTION VENTRICULAR-PERITONEAL;  Surgeon: Eustace Moore, MD;  Location: [facility];  Service: Neurosurgery;  Laterality:  Right;       Home Medications    Prior to Admission medications   Medication Sig Start Date End Date Taking? Authorizing Provider  acetaminophen (TYLENOL) 500 MG tablet Take 500-1,000 mg by mouth 4 (four) times daily as needed for moderate pain.     [provider]  amiodarone (PACERONE) 400 MG tablet Take 1 tablet (400 mg total) by mouth 2 (two) times daily. X 7 days, then decrease to 200 mg (1/2 tab) daily 09/27/16   Barrett, Lodema Hong, PA-C  aspirin EC 81 MG tablet Take 81 mg by mouth daily.     [provider]  atorvastatin (LIPITOR) 10 MG tablet Take 1 tablet (10 mg total) by mouth daily at 6 PM. 09/27/16   Barrett, Erin R, PA-C  B Complex Vitamins (B-COMPLEX HIGH POTENCY PO) Take 1 tablet by mouth daily.     [provider]  Calcium Carbonate-Vitamin D3 (CALCIUM 600-D) 600-400 MG-UNIT TABS Take 3 tablets by mouth daily.    [provider]  diltiazem (CARDIZEM) 60 MG tablet Take 1 tablet (60 mg total) by mouth every 6 (six) hours. 09/27/16   Barrett, Erin R, PA-C  lansoprazole (PREVACID) 30 MG capsule Take 60 mg by mouth daily.     [provider]  mirabegron ER (MYRBETRIQ) 50 MG TB24 tablet Take 50 mg by mouth daily.     [provider]  nabumetone (RELAFEN) 750 MG tablet Take 1,500 mg by mouth daily.  06/09/15   [provider]  psyllium (METAMUCIL) 0.52 g capsule Take 1.56 g by mouth daily.    [provider]  traMADol (ULTRAM) 50 MG tablet Take 1 tablet (50 mg total) by mouth every 4 (four) hours as needed for moderate pain. 09/27/16   Barrett, Erin R, PA-C  warfarin (COUMADIN) 4 MG tablet Take 1 tablet (4 mg total) by mouth daily at 6 PM. 09/27/16   Barrett, Lodema Hong, PA-C    Family History Family History  Problem Relation Age of Onset  . Heart disease Mother   . Heart attack Maternal Grandmother   . Hypertension Neg Hx   . Stroke Neg Hx     Social History Social History  Substance Use Topics  . Smoking status:  Former Smoker    Types: Cigarettes, Cigars, Pipe  . Smokeless tobacco: Never Used     Comment: Quit in the 80's  . Alcohol use 1.2 - 1.8 oz/week    2 - 3 Glasses of wine per week     Comment: occasional 1-2 times week     Allergies   Ace inhibitors; Atenolol; Beta adrenergic blockers; Erythromycin; and Ciprofloxacin   Review of Systems Review of Systems  Respiratory: Negative for cough, chest tightness and shortness of breath.   Cardiovascular: Negative for chest pain, palpitations and leg swelling.  Musculoskeletal: Positive for myalgias. Negative for arthralgias and joint swelling.  Skin: Negative for color  change and pallor.  Neurological: Negative for dizziness, weakness and headaches.  Hematological: Bruises/bleeds easily.     Physical Exam Updated Vital Signs BP 140/76   Pulse 71   Temp 98.6 F (37 C) (Oral)   Resp 16   Ht 5\' 9"  (1.753 m)   Wt 118.8 kg (262 lb)   SpO2 95%   BMI 38.69 kg/m   Physical Exam  Constitutional: He is oriented to person, place, and time. He appears well-developed and well-nourished. No distress.  HENT:  Head: Normocephalic and atraumatic.  Eyes: EOM are normal.  Neck: Normal range of motion.  Cardiovascular: Normal rate, regular rhythm and intact distal pulses.   Pulmonary/Chest: Effort normal and breath sounds normal. No respiratory distress. He has no wheezes. He has no rales.  Well healing central incision without drainage or eyrthema on anterior chest wall.   Abdominal: Soft. Bowel sounds are normal. He exhibits no distension. There is no tenderness. There is no guarding.  Musculoskeletal: Normal range of motion.  No obvious swelling or injury of leg. L leg without erythema or warmth. No laceration or contusion. Minor varicose veins. No TTP of leg. Pt ambulatory without difficulty. Strength intact bilaterally. Sensation intact bilaterally. No swelling or pain in calves. Pedal pulses intact bilaterally. Color and warmth equal  bilaterally. Compartments soft.   Neurological: He is alert and oriented to person, place, and time. No sensory deficit.  Skin: Skin is warm and dry.  Incision site and bruising of R leg where vein was harvested. No bruising of L leg.  Psychiatric: He has a normal mood and affect.  Nursing note and vitals reviewed.    ED Treatments / Results  Labs (all labs ordered are listed, but only abnormal results are displayed) Labs Reviewed  CBC - Abnormal; Notable for the following:       Result Value   Platelets 454 (*)    All other components within normal limits  BASIC METABOLIC PANEL - Abnormal; Notable for the following:    GFR calc non Af Amer 58 (*)    All other components within normal limits  PROTIME-INR - Abnormal; Notable for the following:    Prothrombin Time 24.1 (*)    All other components within normal limits    EKG  EKG Interpretation None       Radiology No results found.  Procedures Procedures (including critical care time)  Medications Ordered in ED Medications - No data to display   Initial Impression / Assessment and Plan / ED Course  I have reviewed the triage vital signs and the nursing notes.  Pertinent labs & imaging results that were available during my care of the patient were reviewed by me and considered in my medical decision making (see chart for details).     Pt presenting with pain of lateral L thigh beginning today. Physical exam without pain, swelling, or signs of injury. Pt neurovascularly intact with soft compartments. Pt ambulatory without difficulty. DVT study negative. Will order basic labs to check blood levels and electrolytes. Case discussed with attending, and Dr. Eulis Foster evaluated the pt.  Labs reassuring. Doubt bleed, clot, or arterial cause of pain. Pt appears safe for discharge. To tx conservatively with tylenol. Return precautions given. Pt states he understands and agrees to plan.    Final Clinical Impressions(s) / ED  Diagnoses   Final diagnoses:  Left leg pain    New Prescriptions Discharge Medication List as of 10/05/2016  8:46 PM  Franchot Heidelberg, PA-C 10/06/16 1738    Daleen Bo, MD 10/09/16 1626

## 2016-10-05 NOTE — ED Triage Notes (Addendum)
Pt states several hours of left lateral thigh pain, stabbing pain. States none at present. Hx of open heart surgery 2 weeks ago. Denies shortness of breath. Pt is on coumadin. Had levels checked 11th. Follow up and recheck is scheduled for Tuesday. Pt states they told him to come here for check for blood clot to leg.

## 2016-10-05 NOTE — Progress Notes (Signed)
VASCULAR LAB PRELIMINARY  PRELIMINARY  PRELIMINARY  PRELIMINARY  Left lower extremity venous duplex completed.    Preliminary report:  There is no DVT or SVT noted in the left lower extremity.  Gave report to Rich Reining, RN  Daveion Robar, RVT 10/05/2016, 5:20 PM

## 2016-10-05 NOTE — Discharge Instructions (Signed)
Use Tylenol as needed for pain. Continue taking your medications as prescribed. Follow-up with your primary care doctor in 1 week if your pain is not improving Return to the emergency room if you develop chest pain, shortness of breath, worsening pain, redness, warmth, or any new or worsening symptoms.

## 2016-10-07 ENCOUNTER — Telehealth: Payer: Self-pay

## 2016-10-07 NOTE — Telephone Encounter (Signed)
Mr Rodier states that he is doing better today. He went to ED over the weekend because of leg pain, he was worried about a DVT.  Left lower extremity venous duplex was completed and was negative for DVT or SVT. He mentioned some bruising and small amount of redness at [facility] site, no drainage or heat and Tylenol and tramadol is controlling his pain. He was instructed to call back if redness spreads or any drainage develops. He agreed

## 2016-10-07 NOTE — Telephone Encounter (Signed)
LMOM for Joseph [name] to call back to Dr Vivi Martens office with update on how he is doing. He went to ED over the weekend.

## 2016-10-08 ENCOUNTER — Ambulatory Visit (INDEPENDENT_AMBULATORY_CARE_PROVIDER_SITE_OTHER): Payer: PPO | Admitting: Interventional Cardiology

## 2016-10-08 DIAGNOSIS — Z5181 Encounter for therapeutic drug level monitoring: Secondary | ICD-10-CM

## 2016-10-08 DIAGNOSIS — Z23 Encounter for immunization: Secondary | ICD-10-CM | POA: Diagnosis not present

## 2016-10-08 DIAGNOSIS — Z951 Presence of aortocoronary bypass graft: Secondary | ICD-10-CM

## 2016-10-08 DIAGNOSIS — Z952 Presence of prosthetic heart valve: Secondary | ICD-10-CM

## 2016-10-08 DIAGNOSIS — Z86010 Personal history of colonic polyps: Secondary | ICD-10-CM | POA: Diagnosis not present

## 2016-10-08 DIAGNOSIS — K219 Gastro-esophageal reflux disease without esophagitis: Secondary | ICD-10-CM | POA: Diagnosis not present

## 2016-10-08 DIAGNOSIS — I10 Essential (primary) hypertension: Secondary | ICD-10-CM | POA: Diagnosis not present

## 2016-10-08 DIAGNOSIS — M1990 Unspecified osteoarthritis, unspecified site: Secondary | ICD-10-CM | POA: Diagnosis not present

## 2016-10-08 DIAGNOSIS — R7303 Prediabetes: Secondary | ICD-10-CM | POA: Diagnosis not present

## 2016-10-08 DIAGNOSIS — N400 Enlarged prostate without lower urinary tract symptoms: Secondary | ICD-10-CM | POA: Diagnosis not present

## 2016-10-08 DIAGNOSIS — F325 Major depressive disorder, single episode, in full remission: Secondary | ICD-10-CM | POA: Diagnosis not present

## 2016-10-08 DIAGNOSIS — I2510 Atherosclerotic heart disease of native coronary artery without angina pectoris: Secondary | ICD-10-CM | POA: Diagnosis not present

## 2016-10-08 DIAGNOSIS — G912 (Idiopathic) normal pressure hydrocephalus: Secondary | ICD-10-CM | POA: Diagnosis not present

## 2016-10-08 DIAGNOSIS — I4891 Unspecified atrial fibrillation: Secondary | ICD-10-CM | POA: Diagnosis not present

## 2016-10-08 LAB — POCT INR: INR: 3

## 2016-10-14 NOTE — Progress Notes (Signed)
Cardiology Office Note    Date:  10/15/2016   ID:  Joseph [name], DOB [birth year], MRN [id number]  PCP:  Gaynelle Arabian, MD  Cardiologist: Dr. Irish Lack  Chief Complaint  Patient presents with  . Atrial Fibrillation    History of Present Illness:  Joseph [name] is a 76 y.o. 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 LVEF 55-60% echo 08/12/16, discharged home 09/26/16. Patient went back to the ER 08/04/16 with lateral left thigh pain DVT study negative, labs reassuring and he was discharged home with Tylenol.  Patient comes in for post hospital f/u. Walks 15 min twice a day. Still gets short of breath but improving. It was taking him 17 minutes but now only 6 minutes. He says his legs swell up some but admits to being very liberal with the saltshaker. Overall he feels like he is progressing nicely. No significant chest pain, palpitations, dizziness or presyncope.  Past Medical History:  Diagnosis Date  . Aortic stenosis   . Arthritis   . BPH (benign prostatic hyperplasia)   . Depression   . Diverticulosis   . Dyspnea   . Family history of adverse reaction to anesthesia    Pts mother had a difficult time awakening after anesthesia  . GERD (gastroesophageal reflux disease)   . Heart murmur   . Hypertension   . 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 Pre-diabetes   . Rupture of left quadriceps tendon 07/21/2014  . Skin cancer, basal cell   . Urinary incontinence     Past Surgical History:  Procedure Laterality Date  . ANAL FISSURE REPAIR    . AORTIC VALVE REPLACEMENT N/A 09/20/2016   Procedure: AORTIC VALVE REPLACEMENT (AVR) USING MAGNA EASE 23 MM PERICARDIAL TISSUE HEART VALVE MODEL 3300TFX;  Surgeon: Gaye Pollack, MD;  Location: [facility];  Service: Open Heart Surgery;  Laterality: N/A;    . BACK SURGERY    . CARDIAC CATHETERIZATION    . COLONOSCOPY W/ POLYPECTOMY    . CORONARY ARTERY BYPASS GRAFT N/A 09/20/2016   Procedure: CORONARY ARTERY BYPASS GRAFTING (CABG)x 3 WITH ENDOSCOPIC HARVESTING OF RIGHT SAPHENOUS VEIN;  Surgeon: Gaye Pollack, MD;  Location: [facility];  Service: Open Heart Surgery;  Laterality: N/A;  . ELBOW SURGERY Right   . FINGER SURGERY    . left shoulder RTC tear    . LEG SURGERY     tendon repair right leg  . MICRODISCECTOMY LUMBAR     l5-s1  . QUADRICEPS TENDON REPAIR Left 07/21/2014   Procedure: REPAIR LEFT QUADRICEP TENDON;  Surgeon: Marchia Bond, MD;  Location: [facility];  Service: Orthopedics;  Laterality: Left;  . RIGHT/LEFT HEART CATH AND CORONARY ANGIOGRAPHY N/A 09/03/2016   Procedure: RIGHT/LEFT HEART CATH AND CORONARY ANGIOGRAPHY;  Surgeon: Jettie Booze, MD;  Location: [facility];  Service: Cardiovascular;  Laterality: N/A;  . TEE WITHOUT CARDIOVERSION N/A 09/20/2016   Procedure: TRANSESOPHAGEAL ECHOCARDIOGRAM (TEE);  Surgeon: Gaye Pollack, MD;  Location: [facility];  Service: Open Heart Surgery;  Laterality: N/A;  . TONSILLECTOMY    . 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   Procedure: SHUNT INSERTION VENTRICULAR-PERITONEAL;  Surgeon: Eustace Moore, MD;  Location: [facility];  Service: Neurosurgery;  Laterality: Right;    Current Medications: Current Meds  Medication Sig  . acetaminophen (TYLENOL) 500 MG tablet Take 500-1,000 mg by mouth 4 (four) times daily as needed for moderate pain.   Marland Kitchen amiodarone (PACERONE) 400 MG tablet Take 1 tablet (400 mg total) by mouth 2 (two) times daily. X 7 days, then decrease to 200 mg (1/2 tab) daily  . aspirin EC 81 MG tablet Take 81 mg by mouth daily.   Marland Kitchen atorvastatin (LIPITOR) 10 MG tablet Take 1 tablet (10 mg total) by mouth daily at 6 PM.  . B Complex Vitamins (B-COMPLEX HIGH POTENCY PO) Take 1 tablet by mouth daily.   . Calcium Carbonate-Vitamin D3 (CALCIUM 600-D)  600-400 MG-UNIT TABS Take 3 tablets by mouth daily.  Marland Kitchen docusate sodium (COLACE) 100 MG capsule Take 100 mg by mouth 2 (two) times daily.  . lansoprazole (PREVACID) 30 MG capsule Take 60 mg by mouth daily.   . mirabegron ER (MYRBETRIQ) 50 MG TB24 tablet Take 50 mg by mouth daily.   . nabumetone (RELAFEN) 750 MG tablet Take 1,500 mg by mouth daily.   . psyllium (METAMUCIL) 0.52 g capsule Take 1.56 g by mouth daily.  . [DISCONTINUED] diltiazem (CARDIZEM) 60 MG tablet Take 1 tablet (60 mg total) by mouth every 6 (six) hours.     Allergies:   Ace inhibitors; Atenolol; Beta adrenergic blockers; Erythromycin; and Ciprofloxacin   Social History   Social History  . Marital status: Married    Spouse name: N/A  . Number of children: N/A  . Years of education: N/A   Social History Main Topics  . Smoking status: Former Smoker    Types: Cigarettes, Cigars, Pipe  . Smokeless tobacco: Never Used     Comment: Quit in the 80's  . Alcohol use 1.2 - 1.8 oz/week    2 - 3 Glasses of wine per week     Comment: occasional 1-2 times week  . Drug use: No  . Sexual activity: Not Currently   Other Topics Concern  . None   Social History Narrative  . None     Family History:  The patient's family history includes Heart attack in his maternal grandmother; Heart disease in his mother.   ROS:   Please see the history of present illness.    Review of Systems  Constitution: Negative.  HENT: Negative.   Cardiovascular: Positive for dyspnea on exertion.  Respiratory: Negative.   Endocrine: Negative.   Hematologic/Lymphatic: Bruises/bleeds easily.  Musculoskeletal: Positive for back pain.  Gastrointestinal: Positive for constipation.  Genitourinary: Negative.   Neurological: Positive for loss of balance.   All other systems reviewed and are negative.   PHYSICAL EXAM:   VS:  BP 100/62   Pulse 73   Resp 16   Ht 5\' 9"  (1.753 m)   Wt 264 lb (119.7 kg)   SpO2 97%   BMI 38.99 kg/m   Physical  Exam  GEN: Obese, in no acute distress  Neck: no JVD, carotid bruits, or masses Cardiac: Incisions healing well RRR; 2/6 systolic murmur at the left sternal border Respiratory:  clear to auscultation bilaterally, normal work of breathing GI: soft, nontender, nondistended, + BS Ext: Trace of edema bilaterally right greater than left without cyanosis, clubbing, Good distal pulses bilaterally Neuro:  Alert and Oriented x 3 Psych: euthymic mood, full affect  Wt Readings from Last 3 Encounters:  10/15/16 264 lb (119.7 kg)  10/05/16 262 lb (118.8 kg)  09/27/16 262 lb 4.8 oz (119 kg)      Studies/Labs Reviewed:  EKG:  EKG is ordered today.  The ekg ordered today demonstrates Normal sinus rhythm with first-degree AV block, nonspecific ST-T wave changes, no acute change  Recent Labs: 09/19/2016: ALT 46 09/21/2016: Magnesium 2.1 10/05/2016: BUN 11; Creatinine, Ser 1.19; Hemoglobin 13.0; Platelets 454; Potassium 4.7; Sodium 139   Lipid Panel No results found for: CHOL, TRIG, HDL, CHOLHDL, VLDL, LDLCALC, LDLDIRECT  Additional studies/ records that were reviewed today include:  2-D echo 7/23/18Study Conclusions   - Left ventricle: The cavity size was normal. Wall thickness was   normal. Systolic function was normal. The estimated ejection   fraction was in the range of 55% to 60%. Wall motion was normal;   there were no regional wall motion abnormalities. There was   fusion of early and atrial contributions to ventricular filling. - Aortic valve: Valve mobility was severely restricted. There was   severe stenosis. Mean gradient (S): 46 mm Hg. Peak gradient (S):   76 mm Hg. Valve area (VTI): 0.88 cm^2. Valve area (Vmax): 0.82   cm^2. Valve area (Vmean): 0.81 cm^2.    TEE 8/31/18Conclusions  Result status: Final result   Left ventricle: Normal cavity size, wall thickness, left ventricular diastolic function and left atrial pressure. LV systolic function is mildly reduced with an EF of  45-50%. There are no obvious wall motion abnormalities.  Aortic valve: The valve is trileaflet. Severe valve thickening present. Severe valve calcification present. Severely decreased leaflet separation. Severe stenosis. Mild to moderate regurgitation.  Mitral valve: Mild regurgitation.  Right ventricle: Normal cavity size, wall thickness and ejection fraction.  Tricuspid valve: Mild regurgitation.        ASSESSMENT:    1. S/P CABG x 3   2. S/P AVR   3. Paroxysmal atrial fibrillation (HCC)   4. Morbid obesity ([place])   5. Essential hypertension      PLAN:  In order of problems listed above:  Status post CABG 3 in tissue aVR 09/20/16. Overall doing well. Does have some edema. Don't believe he needs a diuretic at this time. 2 g sodium diet. Can start cardiac rehabilitation. Follow-up with Dr.Varanasi in 6 weeks. Continue aspirin and Lipitor. Coumadin per Dr. Cyndia Bent.  Postop atrial fibrillation on amiodarone and diltiazem. Will change diltiazem to 240 mg once daily. Amiodarone can probably be stopped at the next office visit. Has had no recurrent atrial fibrillation and is in normal sinus rhythm today.  Morbid obesity exercise and weight loss program recommended  Essential hypertension blood pressure on the low side but it's been about 130/80 at home.  Medication Adjustments/Labs and Tests Ordered: Current medicines are reviewed at length with the patient today.  Concerns regarding medicines are outlined above.  Medication changes, Labs and Tests ordered today are listed in the Patient Instructions below. Patient Instructions  Medication Instructions:  Your physician has recommended you make the following change in your medication:  1.  STOP Cardizem 60 mg 2.  START Cardizem 240 mg taking 1 tablet daily   Labwork: None ordered  Testing/Procedures: None ordered  Follow-Up: Your physician recommends that you schedule a follow-up appointment in: [place] DR.  VARANASI   Any Other Special Instructions Will Be Listed Below (If Applicable).   DASH Eating Plan DASH stands for "Dietary Approaches to Stop Hypertension." The DASH eating plan is a healthy eating plan that has been shown to reduce high blood pressure (hypertension). It may also reduce your risk for type 2 diabetes, heart disease, and stroke. The DASH eating plan  may also help with weight loss. What are tips for following this plan? General guidelines  Avoid eating more than 2,000 mg (milligrams) of salt (sodium) a day. If you have hypertension, you may need to reduce your sodium intake to 1,500 mg a day.  Limit alcohol intake to no more than 1 drink a day for nonpregnant women and 2 drinks a day for men. One drink equals 12 oz of beer, 5 oz of wine, or 1 oz of hard liquor.  Work with your health care provider to maintain a healthy body weight or to lose weight. Ask what an ideal weight is for you.  Get at least 30 minutes of exercise that causes your heart to beat faster (aerobic exercise) most days of the week. Activities may include walking, swimming, or biking.  Work with your health care provider or diet and nutrition specialist (dietitian) to adjust your eating plan to your individual calorie needs. Reading food labels  Check food labels for the amount of sodium per serving. Choose foods with less than 5 percent of the Daily Value of sodium. Generally, foods with less than 300 mg of sodium per serving fit into this eating plan.  To find whole grains, look for the word "whole" as the first word in the ingredient list. Shopping  Buy products labeled as "low-sodium" or "no salt added."  Buy fresh foods. Avoid canned foods and premade or frozen meals. Cooking  Avoid adding salt when cooking. Use salt-free seasonings or herbs instead of table salt or sea salt. Check with your health care provider or pharmacist before using salt substitutes.  Do not fry foods. Cook foods using  healthy methods such as baking, boiling, grilling, and broiling instead.  Cook with heart-healthy oils, such as olive, canola, soybean, or sunflower oil. Meal planning   Eat a balanced diet that includes: ? 5 or more servings of fruits and vegetables each day. At each meal, try to fill half of your plate with fruits and vegetables. ? Up to 6-8 servings of whole grains each day. ? Less than 6 oz of lean meat, poultry, or fish each day. A 3-oz serving of meat is about the same size as a deck of cards. One egg equals 1 oz. ? 2 servings of low-fat dairy each day. ? A serving of nuts, seeds, or beans 5 times each week. ? Heart-healthy fats. Healthy fats called Omega-3 fatty acids are found in foods such as flaxseeds and coldwater fish, like sardines, salmon, and mackerel.  Limit how much you eat of the following: ? Canned or prepackaged foods. ? Food that is high in trans fat, such as fried foods. ? Food that is high in saturated fat, such as fatty meat. ? Sweets, desserts, sugary drinks, and other foods with added sugar. ? Full-fat dairy products.  Do not salt foods before eating.  Try to eat at least 2 vegetarian meals each week.  Eat more home-cooked food and less restaurant, buffet, and fast food.  When eating at a restaurant, ask that your food be prepared with less salt or no salt, if possible. What foods are recommended? The items listed may not be a complete list. Talk with your dietitian about what dietary choices are best for you. Grains Whole-grain or whole-wheat bread. Whole-grain or whole-wheat pasta. Brown rice. [place]. Bulgur. Whole-grain and low-sodium cereals. Pita bread. Low-fat, low-sodium crackers. Whole-wheat flour tortillas. Vegetables Fresh or frozen vegetables (raw, steamed, roasted, or grilled). Low-sodium or reduced-sodium tomato and vegetable  juice. Low-sodium or reduced-sodium tomato sauce and tomato paste. Low-sodium or reduced-sodium canned  vegetables. Fruits All fresh, dried, or frozen fruit. Canned fruit in natural juice (without added sugar). Meat and other protein foods Skinless chicken or [place]. Ground chicken or [place]. Pork with fat trimmed off. Fish and seafood. Egg whites. Dried beans, peas, or lentils. Unsalted nuts, nut butters, and seeds. Unsalted canned beans. Lean cuts of beef with fat trimmed off. Low-sodium, lean deli meat. Dairy Low-fat (1%) or fat-free (skim) milk. Fat-free, low-fat, or reduced-fat cheeses. Nonfat, low-sodium ricotta or cottage cheese. Low-fat or nonfat yogurt. Low-fat, low-sodium cheese. Fats and oils Soft margarine without trans fats. Vegetable oil. Low-fat, reduced-fat, or light mayonnaise and salad dressings (reduced-sodium). Canola, safflower, olive, soybean, and sunflower oils. Avocado. Seasoning and other foods Herbs. Spices. Seasoning mixes without salt. Unsalted popcorn and pretzels. Fat-free sweets. What foods are not recommended? The items listed may not be a complete list. Talk with your dietitian about what dietary choices are best for you. Grains Baked goods made with fat, such as croissants, muffins, or some breads. Dry pasta or rice meal packs. Vegetables Creamed or fried vegetables. Vegetables in a cheese sauce. Regular canned vegetables (not low-sodium or reduced-sodium). Regular canned tomato sauce and paste (not low-sodium or reduced-sodium). Regular tomato and vegetable juice (not low-sodium or reduced-sodium). Angie Fava. Olives. Fruits Canned fruit in a light or heavy syrup. Fried fruit. Fruit in cream or butter sauce. Meat and other protein foods Fatty cuts of meat. Ribs. Fried meat. Berniece Salines. Sausage. Bologna and other processed lunch meats. Salami. Fatback. Hotdogs. Bratwurst. Salted nuts and seeds. Canned beans with added salt. Canned or smoked fish. Whole eggs or egg yolks. Chicken or [place] with skin. Dairy Whole or 2% milk, cream, and half-and-half. Whole or full-fat  cream cheese. Whole-fat or sweetened yogurt. Full-fat cheese. Nondairy creamers. Whipped toppings. Processed cheese and cheese spreads. Fats and oils Butter. Stick margarine. Lard. Shortening. Ghee. Bacon fat. Tropical oils, such as coconut, palm kernel, or palm oil. Seasoning and other foods Salted popcorn and pretzels. Onion salt, garlic salt, seasoned salt, table salt, and sea salt. Worcestershire sauce. Tartar sauce. Barbecue sauce. Teriyaki sauce. Soy sauce, including reduced-sodium. Steak sauce. Canned and packaged gravies. Fish sauce. Oyster sauce. Cocktail sauce. Horseradish that you find on the shelf. Ketchup. Mustard. Meat flavorings and tenderizers. Bouillon cubes. Hot sauce and Tabasco sauce. Premade or packaged marinades. Premade or packaged taco seasonings. Relishes. Regular salad dressings. Where to find more information:  National Heart, Lung, and [place]: https://wilson-eaton.com/  American Heart Association: www.heart.org Summary  The DASH eating plan is a healthy eating plan that has been shown to reduce high blood pressure (hypertension). It may also reduce your risk for type 2 diabetes, heart disease, and stroke.  With the DASH eating plan, you should limit salt (sodium) intake to 2,300 mg a day. If you have hypertension, you may need to reduce your sodium intake to 1,500 mg a day.  When on the DASH eating plan, aim to eat more fresh fruits and vegetables, whole grains, lean proteins, low-fat dairy, and heart-healthy fats.  Work with your health care provider or diet and nutrition specialist (dietitian) to adjust your eating plan to your individual calorie needs. This information is not intended to replace advice given to you by your health care provider. Make sure you discuss any questions you have with your health care provider. Document Released: 12/27/2010 Document Revised: 01/01/2016 Document Reviewed: 01/01/2016 Elsevier Interactive Patient Education  2017 Elsevier  Inc.    If you need a refill on your cardiac medications before your next appointment, please call your pharmacy.      Sumner Boast, PA-C  10/15/2016 9:21 AM    [facility] [place], [place], [place]  [address] Phone: [phone]; Fax: [phone]

## 2016-10-15 ENCOUNTER — Encounter: Payer: Self-pay | Admitting: Physician Assistant

## 2016-10-15 ENCOUNTER — Ambulatory Visit (INDEPENDENT_AMBULATORY_CARE_PROVIDER_SITE_OTHER): Payer: PPO | Admitting: Physician Assistant

## 2016-10-15 VITALS — BP 100/62 | HR 73 | Resp 16 | Ht 69.0 in | Wt 264.0 lb

## 2016-10-15 DIAGNOSIS — I480 Paroxysmal atrial fibrillation: Secondary | ICD-10-CM

## 2016-10-15 DIAGNOSIS — I10 Essential (primary) hypertension: Secondary | ICD-10-CM | POA: Diagnosis not present

## 2016-10-15 DIAGNOSIS — Z951 Presence of aortocoronary bypass graft: Secondary | ICD-10-CM

## 2016-10-15 DIAGNOSIS — Z5181 Encounter for therapeutic drug level monitoring: Secondary | ICD-10-CM | POA: Diagnosis not present

## 2016-10-15 DIAGNOSIS — Z7901 Long term (current) use of anticoagulants: Secondary | ICD-10-CM | POA: Diagnosis not present

## 2016-10-15 DIAGNOSIS — Z952 Presence of prosthetic heart valve: Secondary | ICD-10-CM | POA: Diagnosis not present

## 2016-10-15 DIAGNOSIS — I4891 Unspecified atrial fibrillation: Secondary | ICD-10-CM | POA: Diagnosis not present

## 2016-10-15 LAB — PROTIME-INR: INR: 3.3 — AB (ref 0.9–1.1)

## 2016-10-15 MED ORDER — DILTIAZEM HCL ER COATED BEADS 240 MG PO CP24: 240.0000 mg | ORAL_CAPSULE | Freq: Every day | ORAL | 1 refills | Status: DC

## 2016-10-15 NOTE — Patient Instructions (Signed)
Medication Instructions:  Your physician has recommended you make the following change in your medication:  1.  STOP Cardizem 60 mg 2.  START Cardizem 240 mg taking 1 tablet daily   Labwork: None ordered  Testing/Procedures: None ordered  Follow-Up: Your physician recommends that you schedule a follow-up appointment in: [place] DR. VARANASI   Any Other Special Instructions Will Be Listed Below (If Applicable).   DASH Eating Plan DASH stands for "Dietary Approaches to Stop Hypertension." The DASH eating plan is a healthy eating plan that has been shown to reduce high blood pressure (hypertension). It may also reduce your risk for type 2 diabetes, heart disease, and stroke. The DASH eating plan may also help with weight loss. What are tips for following this plan? General guidelines  Avoid eating more than 2,000 mg (milligrams) of salt (sodium) a day. If you have hypertension, you may need to reduce your sodium intake to 1,500 mg a day.  Limit alcohol intake to no more than 1 drink a day for nonpregnant women and 2 drinks a day for men. One drink equals 12 oz of beer, 5 oz of wine, or 1 oz of hard liquor.  Work with your health care provider to maintain a healthy body weight or to lose weight. Ask what an ideal weight is for you.  Get at least 30 minutes of exercise that causes your heart to beat faster (aerobic exercise) most days of the week. Activities may include walking, swimming, or biking.  Work with your health care provider or diet and nutrition specialist (dietitian) to adjust your eating plan to your individual calorie needs. Reading food labels  Check food labels for the amount of sodium per serving. Choose foods with less than 5 percent of the Daily Value of sodium. Generally, foods with less than 300 mg of sodium per serving fit into this eating plan.  To find whole grains, look for the word "whole" as the first word in the ingredient list. Shopping  Buy  products labeled as "low-sodium" or "no salt added."  Buy fresh foods. Avoid canned foods and premade or frozen meals. Cooking  Avoid adding salt when cooking. Use salt-free seasonings or herbs instead of table salt or sea salt. Check with your health care provider or pharmacist before using salt substitutes.  Do not fry foods. Cook foods using healthy methods such as baking, boiling, grilling, and broiling instead.  Cook with heart-healthy oils, such as olive, canola, soybean, or sunflower oil. Meal planning   Eat a balanced diet that includes: ? 5 or more servings of fruits and vegetables each day. At each meal, try to fill half of your plate with fruits and vegetables. ? Up to 6-8 servings of whole grains each day. ? Less than 6 oz of lean meat, poultry, or fish each day. A 3-oz serving of meat is about the same size as a deck of cards. One egg equals 1 oz. ? 2 servings of low-fat dairy each day. ? A serving of nuts, seeds, or beans 5 times each week. ? Heart-healthy fats. Healthy fats called Omega-3 fatty acids are found in foods such as flaxseeds and coldwater fish, like sardines, salmon, and mackerel.  Limit how much you eat of the following: ? Canned or prepackaged foods. ? Food that is high in trans fat, such as fried foods. ? Food that is high in saturated fat, such as fatty meat. ? Sweets, desserts, sugary drinks, and other foods with added sugar. ?  Full-fat dairy products.  Do not salt foods before eating.  Try to eat at least 2 vegetarian meals each week.  Eat more home-cooked food and less restaurant, buffet, and fast food.  When eating at a restaurant, ask that your food be prepared with less salt or no salt, if possible. What foods are recommended? The items listed may not be a complete list. Talk with your dietitian about what dietary choices are best for you. Grains Whole-grain or whole-wheat bread. Whole-grain or whole-wheat pasta. Brown rice. [place].  Bulgur. Whole-grain and low-sodium cereals. Pita bread. Low-fat, low-sodium crackers. Whole-wheat flour tortillas. Vegetables Fresh or frozen vegetables (raw, steamed, roasted, or grilled). Low-sodium or reduced-sodium tomato and vegetable juice. Low-sodium or reduced-sodium tomato sauce and tomato paste. Low-sodium or reduced-sodium canned vegetables. Fruits All fresh, dried, or frozen fruit. Canned fruit in natural juice (without added sugar). Meat and other protein foods Skinless chicken or [place]. Ground chicken or [place]. Pork with fat trimmed off. Fish and seafood. Egg whites. Dried beans, peas, or lentils. Unsalted nuts, nut butters, and seeds. Unsalted canned beans. Lean cuts of beef with fat trimmed off. Low-sodium, lean deli meat. Dairy Low-fat (1%) or fat-free (skim) milk. Fat-free, low-fat, or reduced-fat cheeses. Nonfat, low-sodium ricotta or cottage cheese. Low-fat or nonfat yogurt. Low-fat, low-sodium cheese. Fats and oils Soft margarine without trans fats. Vegetable oil. Low-fat, reduced-fat, or light mayonnaise and salad dressings (reduced-sodium). Canola, safflower, olive, soybean, and sunflower oils. Avocado. Seasoning and other foods Herbs. Spices. Seasoning mixes without salt. Unsalted popcorn and pretzels. Fat-free sweets. What foods are not recommended? The items listed may not be a complete list. Talk with your dietitian about what dietary choices are best for you. Grains Baked goods made with fat, such as croissants, muffins, or some breads. Dry pasta or rice meal packs. Vegetables Creamed or fried vegetables. Vegetables in a cheese sauce. Regular canned vegetables (not low-sodium or reduced-sodium). Regular canned tomato sauce and paste (not low-sodium or reduced-sodium). Regular tomato and vegetable juice (not low-sodium or reduced-sodium). Angie Fava. Olives. Fruits Canned fruit in a light or heavy syrup. Fried fruit. Fruit in cream or butter sauce. Meat and other  protein foods Fatty cuts of meat. Ribs. Fried meat. Berniece Salines. Sausage. Bologna and other processed lunch meats. Salami. Fatback. Hotdogs. Bratwurst. Salted nuts and seeds. Canned beans with added salt. Canned or smoked fish. Whole eggs or egg yolks. Chicken or [place] with skin. Dairy Whole or 2% milk, cream, and half-and-half. Whole or full-fat cream cheese. Whole-fat or sweetened yogurt. Full-fat cheese. Nondairy creamers. Whipped toppings. Processed cheese and cheese spreads. Fats and oils Butter. Stick margarine. Lard. Shortening. Ghee. Bacon fat. Tropical oils, such as coconut, palm kernel, or palm oil. Seasoning and other foods Salted popcorn and pretzels. Onion salt, garlic salt, seasoned salt, table salt, and sea salt. Worcestershire sauce. Tartar sauce. Barbecue sauce. Teriyaki sauce. Soy sauce, including reduced-sodium. Steak sauce. Canned and packaged gravies. Fish sauce. Oyster sauce. Cocktail sauce. Horseradish that you find on the shelf. Ketchup. Mustard. Meat flavorings and tenderizers. Bouillon cubes. Hot sauce and Tabasco sauce. Premade or packaged marinades. Premade or packaged taco seasonings. Relishes. Regular salad dressings. Where to find more information:  National Heart, Lung, and [place]: https://wilson-eaton.com/  American Heart Association: www.heart.org Summary  The DASH eating plan is a healthy eating plan that has been shown to reduce high blood pressure (hypertension). It may also reduce your risk for type 2 diabetes, heart disease, and stroke.  With the DASH eating plan, you  should limit salt (sodium) intake to 2,300 mg a day. If you have hypertension, you may need to reduce your sodium intake to 1,500 mg a day.  When on the DASH eating plan, aim to eat more fresh fruits and vegetables, whole grains, lean proteins, low-fat dairy, and heart-healthy fats.  Work with your health care provider or diet and nutrition specialist (dietitian) to adjust your eating plan to your  individual calorie needs. This information is not intended to replace advice given to you by your health care provider. Make sure you discuss any questions you have with your health care provider. Document Released: 12/27/2010 Document Revised: 01/01/2016 Document Reviewed: 01/01/2016 Elsevier Interactive Patient Education  2017 [employer].    If you need a refill on your cardiac medications before your next appointment, please call your pharmacy.

## 2016-10-16 ENCOUNTER — Ambulatory Visit: Payer: Self-pay | Admitting: Internal Medicine

## 2016-10-16 DIAGNOSIS — I480 Paroxysmal atrial fibrillation: Secondary | ICD-10-CM

## 2016-10-16 DIAGNOSIS — Z952 Presence of prosthetic heart valve: Secondary | ICD-10-CM

## 2016-10-16 DIAGNOSIS — Z951 Presence of aortocoronary bypass graft: Secondary | ICD-10-CM

## 2016-10-16 DIAGNOSIS — Z5181 Encounter for therapeutic drug level monitoring: Secondary | ICD-10-CM

## 2016-10-18 ENCOUNTER — Telehealth (HOSPITAL_COMMUNITY): Payer: Self-pay

## 2016-10-18 NOTE — Telephone Encounter (Signed)
I called and spoke to patient about cardiac rehab. Patient is currently not receiving PT services at home. Patient stated that he is interested, but has not been cleared to drive. Patient haas an upcoming appointment and will contacts [place] to schedule once cleared to drive. Patient given office contact information to return call.

## 2016-10-22 ENCOUNTER — Telehealth: Payer: Self-pay | Admitting: Interventional Cardiology

## 2016-10-22 ENCOUNTER — Ambulatory Visit: Payer: Self-pay | Admitting: Cardiology

## 2016-10-22 DIAGNOSIS — I480 Paroxysmal atrial fibrillation: Secondary | ICD-10-CM

## 2016-10-22 DIAGNOSIS — Z951 Presence of aortocoronary bypass graft: Secondary | ICD-10-CM

## 2016-10-22 DIAGNOSIS — I4891 Unspecified atrial fibrillation: Secondary | ICD-10-CM | POA: Diagnosis not present

## 2016-10-22 DIAGNOSIS — Z5181 Encounter for therapeutic drug level monitoring: Secondary | ICD-10-CM

## 2016-10-22 DIAGNOSIS — Z952 Presence of prosthetic heart valve: Secondary | ICD-10-CM

## 2016-10-22 LAB — PROTIME-INR: INR: 2.7 — AB (ref 0.9–1.1)

## 2016-10-22 NOTE — Telephone Encounter (Signed)
Spoke with pt, please refer to Anticoagulation Track for dosing instructions.

## 2016-10-22 NOTE — Telephone Encounter (Signed)
New Message  Pt call requesting to speak with coumadin. Pt states he was supposed to receive a call. Please call back to discuss

## 2016-10-29 ENCOUNTER — Other Ambulatory Visit: Payer: Self-pay | Admitting: Surgery

## 2016-10-29 DIAGNOSIS — Z951 Presence of aortocoronary bypass graft: Secondary | ICD-10-CM

## 2016-10-30 ENCOUNTER — Encounter: Payer: Self-pay | Admitting: Surgery

## 2016-10-30 ENCOUNTER — Telehealth (HOSPITAL_COMMUNITY): Payer: Self-pay | Admitting: Pharmacist

## 2016-10-30 ENCOUNTER — Ambulatory Visit (INDEPENDENT_AMBULATORY_CARE_PROVIDER_SITE_OTHER): Payer: Self-pay | Admitting: Surgery

## 2016-10-30 ENCOUNTER — Ambulatory Visit
Admission: RE | Admit: 2016-10-30 | Discharge: 2016-10-30 | Disposition: A | Discharge status: Home / Self Care | Payer: PPO | Source: Ambulatory Visit | Attending: Surgery | Admitting: Surgery

## 2016-10-30 VITALS — BP 139/77 | HR 83 | Ht 69.0 in | Wt 261.0 lb

## 2016-10-30 DIAGNOSIS — Z736 Limitation of activities due to disability: Secondary | ICD-10-CM

## 2016-10-30 DIAGNOSIS — R0602 Shortness of breath: Secondary | ICD-10-CM | POA: Diagnosis not present

## 2016-10-30 DIAGNOSIS — Z951 Presence of aortocoronary bypass graft: Secondary | ICD-10-CM

## 2016-10-30 NOTE — Telephone Encounter (Signed)
Cardiac Rehab Medication Review by a Pharmacist  Does the patient feel that his/her medications are working for him/her?  "Difficult to tell"  Has the patient been experiencing any side effects to the medications prescribed?  "No idea"  Does the patient measure his/her own blood pressure or blood glucose at home?  yes   Does the patient have any problems obtaining medications due to transportation or finances?   no  Understanding of regimen: fair Understanding of indications: fair Potential of compliance: fair   Pharmacist comments: Joseph [name] is presenting for cardiac rehab orientation. There have been no changes to his medications since the last time the list was reviewed. He seems familiar with his medications and was able to confirm or deny each one when prompted. He did mention that he doesn't really know what his medications are for and as such, he can't be sure that they are working. I reviewed the likely indications for his medications. He would like to discuss coming off of amiodarone and warfarin because he only had one incidence of afib after surgery and I suggested that he addressed that with his cardiologist. He isn't sure if he is experiencing side effects, but does not report any nausea, dizziness, HA, etc.  Erin N. Gerarda Fraction, PharmD PGY1 Pharmacy Resident Pager: [phone] 10/30/2016 4:16 PM

## 2016-10-30 NOTE — Progress Notes (Signed)
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 The patient's early postoperative recovery while in the hospital was notable for development of postop atrial fibrillation that was difficult to control and required amiodarone and diltiazem to control. He had a history of tolerating beta blockers poorly. He was sent home on Coumadin.Since hospital discharge the patient reports that he has been feeling much better. He is ambulating for 15 minutes twice daily and notes that his stamina is improving. He has had some swelling in his legs but that is improving.   Current Outpatient Prescriptions  Medication Sig Dispense Refill  . acetaminophen (TYLENOL) 500 MG tablet Take 500-1,000 mg by mouth 4 (four) times daily as needed for moderate pain.     Marland Kitchen amiodarone (PACERONE) 400 MG tablet Take 1 tablet (400 mg total) by mouth 2 (two) times daily. X 7 days, then decrease to 200 mg (1/2 tab) daily 60 tablet 1  . aspirin EC 81 MG tablet Take 81 mg by mouth daily.     Marland Kitchen atorvastatin (LIPITOR) 10 MG tablet Take 1 tablet (10 mg total) by mouth daily at 6 PM. 30 tablet 3  . B Complex Vitamins (B-COMPLEX HIGH POTENCY PO) Take 1 tablet by mouth daily.     . Calcium Carbonate-Vitamin D3 (CALCIUM 600-D) 600-400 MG-UNIT TABS Take 3 tablets by mouth daily.    Marland Kitchen diltiazem (CARDIZEM CD) 240 MG 24 hr capsule Take 1 capsule (240 mg total) by mouth daily. 90 capsule 1  . docusate sodium (COLACE) 100 MG capsule Take 100 mg by mouth daily.     . lansoprazole (PREVACID) 30 MG capsule Take 30 mg by mouth 2 (two) times daily.     . mirabegron ER (MYRBETRIQ) 50 MG TB24 tablet Take 50 mg by mouth daily.     . nabumetone (RELAFEN) 750 MG tablet Take 1,500 mg by mouth daily.     . psyllium (METAMUCIL) 0.52 g capsule Take 1.56 g by mouth daily.    Marland Kitchen warfarin (COUMADIN) 4 MG tablet Take 1 tablet (4 mg total) by mouth daily  at 6 PM. (Patient taking differently: Take 4 mg by mouth as directed. 2mg  s, m, t, w, f, s, 4mg  thursday) 30 tablet 3   No current facility-administered medications for this visit.     Physical Exam: BP 139/77   Pulse 83   Ht 5\' 9"  (1.753 m)   Wt 261 lb (118.4 kg)   SpO2 98%   BMI 38.54 kg/m  He looks well. Lung exam is clear. Cardiac exam shows a regular rate and rhythm with normal heart sounds. Chest incision is healing well and sternum is stable. The leg incisions are healing well and there is no peripheral edema.    Diagnostic Tests:  CLINICAL DATA:  CABG 09/20/2016, shortness of breath  EXAM: CHEST  2 VIEW  COMPARISON:  09/19/2016  FINDINGS: There is a chills peritoneal shunt catheter tubing partially visualize. There is no focal parenchymal opacity. There is no pleural effusion or pneumothorax. The heart and mediastinal contours are unremarkable. There is evidence of recent CABG.  The osseous structures are unremarkable.  IMPRESSION: No active cardiopulmonary disease.   Electronically Signed   By: Kathreen Devoid   On: 10/30/2016 08:56   Impression:  Overall I think he is doing well. I encouraged him to continue walking. He is planning to participate in cardiac rehab. I  told him he could drive his car but should not lift anything heavier than 10 lbs for three months postop. He appears to be maintaining sinus rhythm and should be able to get off amiodarone at his next cardiology visit. If he maintains sinus off amiodarone he should be able to stop the Coumadin.   Plan:  He has a follow up appt with Dr. Irish Lack in about a month. He will return to see me if he develops any problems with his incisions.   Gaye Pollack, MD [facility] [phone]

## 2016-11-04 ENCOUNTER — Telehealth (HOSPITAL_COMMUNITY): Payer: Self-pay

## 2016-11-04 NOTE — Telephone Encounter (Signed)
UPDATE: Verified insurance. $15 co-payment, no deductible, out of pocket amount is $3,400/$3,906.27 has been met, no co-insurance, and no pre-authorization is required. Spoke with [place] from [employer]. Reference # [id number]

## 2016-11-05 ENCOUNTER — Ambulatory Visit: Payer: Self-pay | Admitting: Cardiovascular Disease

## 2016-11-05 ENCOUNTER — Encounter (HOSPITAL_COMMUNITY)
Admission: RE | Admit: 2016-11-05 | Discharge: 2016-11-05 | Disposition: A | Discharge status: Home / Self Care | Payer: PPO | Source: Ambulatory Visit | Attending: Interventional Cardiology | Admitting: Interventional Cardiology

## 2016-11-05 ENCOUNTER — Encounter (HOSPITAL_COMMUNITY): Payer: Self-pay

## 2016-11-05 VITALS — BP 104/72 | HR 71 | Ht 67.5 in | Wt 261.2 lb

## 2016-11-05 DIAGNOSIS — Z952 Presence of prosthetic heart valve: Secondary | ICD-10-CM | POA: Insufficient documentation

## 2016-11-05 DIAGNOSIS — I4891 Unspecified atrial fibrillation: Secondary | ICD-10-CM | POA: Diagnosis not present

## 2016-11-05 DIAGNOSIS — Z48812 Encounter for surgical aftercare following surgery on the circulatory system: Secondary | ICD-10-CM | POA: Diagnosis not present

## 2016-11-05 DIAGNOSIS — Z5181 Encounter for therapeutic drug level monitoring: Secondary | ICD-10-CM

## 2016-11-05 DIAGNOSIS — Z7901 Long term (current) use of anticoagulants: Secondary | ICD-10-CM | POA: Diagnosis not present

## 2016-11-05 DIAGNOSIS — Z951 Presence of aortocoronary bypass graft: Secondary | ICD-10-CM

## 2016-11-05 DIAGNOSIS — I480 Paroxysmal atrial fibrillation: Secondary | ICD-10-CM

## 2016-11-05 LAB — PROTIME-INR: INR: 2.3 — AB (ref 0.9–1.1)

## 2016-11-05 NOTE — Progress Notes (Signed)
Cardiac Individual Treatment Plan  Patient Details  Name: Joseph [name] MRN: [id number] Date of Birth: 04-03-1940 Referring Provider:     CARDIAC REHAB PHASE II ORIENTATION from 11/05/2016 in [place]  Referring Provider  Larae Grooms MD      Initial Encounter Date:    CARDIAC REHAB PHASE II ORIENTATION from 11/05/2016 in 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  acetaminophen (TYLENOL) 500 MG tablet, Take 500-1,000 mg by mouth 4 (four) times daily as needed for moderate pain. , Disp: , Rfl:  .  amiodarone (PACERONE) 400 MG tablet, Take 1 tablet (400 mg total) by mouth 2 (two) times daily. X 7 days, then decrease to 200 mg (1/2 tab) daily, Disp: 60 tablet, Rfl: 1 .  aspirin EC 81 MG tablet, Take 81 mg by mouth daily. , Disp: , Rfl:  .  atorvastatin (LIPITOR) 10 MG tablet, Take 1 tablet (10 mg total) by mouth daily at 6 PM., Disp: 30 tablet, Rfl: 3 .  B Complex Vitamins (B-COMPLEX HIGH POTENCY PO), Take 1 tablet by mouth daily. , Disp: , Rfl:  .  Calcium Carbonate-Vitamin D3 (CALCIUM 600-D) 600-400 MG-UNIT TABS, Take 3 tablets by mouth daily., Disp: , Rfl:  .  diltiazem (CARDIZEM CD) 240 MG 24 hr capsule, Take 1 capsule (240 mg total) by mouth daily., Disp: 90 capsule, Rfl: 1 .  docusate sodium (COLACE) 100 MG capsule, Take 100 mg by mouth daily. , Disp: , Rfl:  .  lansoprazole (PREVACID) 30 MG capsule, Take 30 mg by mouth 2 (two) times daily. , Disp: , Rfl:  .  mirabegron ER (MYRBETRIQ) 50 MG TB24 tablet, Take 50 mg by mouth daily. , Disp: , Rfl:  .  nabumetone (RELAFEN) 750 MG tablet, Take 1,500 mg by mouth daily. , Disp: , Rfl:  .  psyllium (METAMUCIL) 0.52 g capsule, Take 1.56 g by mouth daily., Disp: , Rfl:   .  warfarin (COUMADIN) 4 MG tablet, Take 1 tablet (4 mg total) by mouth daily at 6 PM. (Patient taking differently: Take 4 mg by mouth as directed. 2mg  s, m, t, w, f, s, 4mg  thursday), Disp: 30 tablet, Rfl: 3  Past Medical History: Past Medical History:  Diagnosis Date  . Aortic stenosis   . Arthritis   . BPH (benign prostatic hyperplasia)   . Depression   . Diverticulosis   . Dyspnea   . Family history of adverse reaction to anesthesia    Pts mother had a difficult time awakening after anesthesia  . GERD (gastroesophageal reflux disease)   . Heart murmur   . Hypertension   . 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 Pre-diabetes   . Rupture of left quadriceps tendon 07/21/2014  . Skin cancer, basal cell   . Urinary incontinence     Tobacco Use: History  Smoking Status  . Former Smoker  . Types: Cigarettes, Cigars, Pipe  Smokeless Tobacco  . Never Used    Comment: Quit in the 80's    Labs: Recent Review Flowsheet Data    Labs for ITP Cardiac and Pulmonary Rehab Latest Ref Rng & Units 09/20/2016 09/20/2016 09/20/2016 09/20/2016 09/21/2016   Hemoglobin A1c 4.8 - 5.6 % - - - - -  PHART 7.350 - 7.450 7.357 7.333(L) - 7.364 -   PCO2ART 32.0 - 48.0 mmHg 43.1 44.5 - 41.3 -   HCO3 20.0 - 28.0 mmol/L 24.5 23.8 - 23.7 -   TCO2 22 - 32 mmol/L 26 25 24 25 25    ACIDBASEDEF 0.0 - 2.0 mmol/L 2.0 2.0 - 2.0 -   O2SAT % 96.0 91.0 - 93.0 -      Capillary Blood Glucose: Lab Results  Component Value Date   GLUCAP 111 (H) 09/22/2016   GLUCAP 141 (H) 09/22/2016   GLUCAP 150 (H) 09/21/2016   GLUCAP 159 (H) 09/21/2016   GLUCAP 125 (H) 09/21/2016     Exercise Target Goals: Date: 11/05/16  Exercise Program Goal: Individual exercise prescription set with THRR, safety & activity barriers. Participant demonstrates ability to understand and report RPE using BORG scale, to self-measure pulse accurately, and to acknowledge the importance of the exercise  prescription.  Exercise Prescription Goal: Starting with aerobic activity 30 plus minutes a day, 3 days per week for initial exercise prescription. Provide home exercise prescription and guidelines that participant acknowledges understanding prior to discharge.  Activity Barriers & Risk Stratification:     Activity Barriers & Cardiac Risk Stratification - 11/05/16 1452      Activity Barriers & Cardiac Risk Stratification   Activity Barriers Back Problems;Arthritis;Deconditioning;Balance Concerns;Assistive Device;History of Falls;Other (comment);Muscular Weakness   Comments B knee pain,R elbow restrictions (broke at age 64)   Cardiac Risk Stratification High      6 Minute Walk:     6 Minute Walk    Row Name 11/05/16 1514         6 Minute Walk   Phase Initial     Distance 800 feet     Walk Time 6 minutes     # of Rest Breaks 0     MPH 1.5     METS 1.03     RPE 13     Symptoms No     Resting HR 71 bpm     Resting BP 104/72     Resting Oxygen Saturation  96 %     Exercise Oxygen Saturation  during 6 min walk 95 %     Max Ex. HR 99 bpm     Max Ex. BP 128/70     2 Minute Post BP 114/66        Oxygen Initial Assessment:   Oxygen Re-Evaluation:   Oxygen Discharge (Final Oxygen Re-Evaluation):   Initial Exercise Prescription:     Initial Exercise Prescription - 11/05/16 1500      Date of Initial Exercise RX and Referring Provider   Date 11/05/16   Referring Provider Larae Grooms MD     Recumbant Bike   Level 1.5   Minutes 10   METs 1.5     NuStep   Level 2   SPM 70   Minutes 10   METs 1.5     Track   Laps 4   Minutes 10   METs 1.7     Prescription Details   Frequency (times per week) 3   Duration Progress to 30 minutes of continuous aerobic without signs/symptoms of physical distress     Intensity   THRR 40-80% of Max Heartrate 58-117   Ratings of Perceived Exertion 11-15   Perceived Dyspnea 0-4     Progression   Progression Continue  to progress workloads to maintain intensity without signs/symptoms of physical distress.     Resistance Training  Training Prescription Yes   Weight 2lbs   Reps 10-15      Perform Capillary Blood Glucose checks as needed.  Exercise Prescription Changes:   Exercise Comments:   Exercise Goals and Review:      Exercise Goals    Row Name 11/05/16 1453             Exercise Goals   Increase Physical Activity Yes       Intervention Provide advice, education, support and counseling about physical activity/exercise needs.;Develop an individualized exercise prescription for aerobic and resistive training based on initial evaluation findings, risk stratification, comorbidities and participant's personal goals.       Expected Outcomes Achievement of increased cardiorespiratory fitness and enhanced flexibility, muscular endurance and strength shown through measurements of functional capacity and personal statement of participant.       Increase Strength and Stamina Yes       Intervention Provide advice, education, support and counseling about physical activity/exercise needs.;Develop an individualized exercise prescription for aerobic and resistive training based on initial evaluation findings, risk stratification, comorbidities and participant's personal goals.       Expected Outcomes Achievement of increased cardiorespiratory fitness and enhanced flexibility, muscular endurance and strength shown through measurements of functional capacity and personal statement of participant.       Able to understand and use rate of perceived exertion (RPE) scale Yes       Intervention Provide education and explanation on how to use RPE scale       Expected Outcomes Short Term: Able to use RPE daily in rehab to express subjective intensity level;Long Term:  Able to use RPE to guide intensity level when exercising independently       Knowledge and understanding of Target Heart Rate Range (THRR) Yes        Intervention Provide education and explanation of THRR including how the numbers were predicted and where they are located for reference       Expected Outcomes Short Term: Able to state/look up THRR;Long Term: Able to use THRR to govern intensity when exercising independently;Short Term: Able to use daily as guideline for intensity in rehab       Able to check pulse independently Yes       Intervention Provide education and demonstration on how to check pulse in carotid and radial arteries.;Review the importance of being able to check your own pulse for safety during independent exercise       Expected Outcomes Short Term: Able to explain why pulse checking is important during independent exercise;Long Term: Able to check pulse independently and accurately       Understanding of Exercise Prescription Yes       Intervention Provide education, explanation, and written materials on patient's individual exercise prescription       Expected Outcomes Short Term: Able to explain program exercise prescription;Long Term: Able to explain home exercise prescription to exercise independently          Exercise Goals Re-Evaluation :    Discharge Exercise Prescription (Final Exercise Prescription Changes):   Nutrition:  Target Goals: Understanding of nutrition guidelines, daily intake of sodium 1500mg , cholesterol 200mg , calories 30% from fat and 7% or less from saturated fats, daily to have 5 or more servings of fruits and vegetables.  Biometrics:     Pre Biometrics - 11/05/16 1548      Pre Biometrics   Height 5' 7.5" (1.715 m)   Weight 261 lb 3.9 oz (118.5 kg)  Waist Circumference 52 inches   Hip Circumference 51.5 inches   Waist to Hip Ratio 1.01 %   BMI (Calculated) 40.29   Triceps Skinfold 27 mm   % Body Fat 40.8 %   Grip Strength 27 kg   Flexibility 0 in   Single Leg Stand 1.56 seconds       Nutrition Therapy Plan and Nutrition Goals:     Nutrition Therapy & Goals -  11/05/16 1455      Nutrition Therapy   Diet Therapeutic Lifestyle Changes   Drug/Food Interactions Coumadin/Vit K     Personal Nutrition Goals   Nutrition Goal Pt to identify food quantities necessary to achieve weight loss of 6-24 lb (2.7-10.9 kg) at graduation from cardiac rehab.    Personal Goal #2 Pt able to name foods rich in vitamin K. (Pt taking Coumadin/Warfarin).   Personal Goal #3 Pt to identify and limit food sources of saturated fat, trans fat and sodium.      Intervention Plan   Intervention Prescribe, educate and counsel regarding individualized specific dietary modifications aiming towards targeted core components such as weight, hypertension, lipid management, diabetes, heart failure and other comorbidities.   Expected Outcomes Short Term Goal: Understand basic principles of dietary content, such as calories, fat, sodium, cholesterol and nutrients.;Long Term Goal: Adherence to prescribed nutrition plan.      Nutrition Discharge: Nutrition Scores:     Nutrition Assessments - 11/05/16 1453      MEDFICTS Scores   Pre Score 97      Nutrition Goals Re-Evaluation:   Nutrition Goals Re-Evaluation:   Nutrition Goals Discharge (Final Nutrition Goals Re-Evaluation):   Psychosocial: Target Goals: Acknowledge presence or absence of significant depression and/or stress, maximize coping skills, provide positive support system. Participant is able to verbalize types and ability to use techniques and skills needed for reducing stress and depression.  Initial Review & Psychosocial Screening:     Initial Psych Review & Screening - 11/05/16 1551      Initial Review   Current issues with None Identified     Family Dynamics   Good Support System? Yes  wife and friends   Comments No psychosocial needs identifed. No interventions necessary.     Barriers   Psychosocial barriers to participate in program There are no identifiable barriers or psychosocial needs.      Screening Interventions   Interventions Encouraged to exercise      Quality of Life Scores:     Quality of Life - 11/05/16 1522      Quality of Life Scores   Health/Function Pre 24.77 %   Socioeconomic Pre 26.25 %   Psych/Spiritual Pre 22.92 %   Family Pre 26.63 %   GLOBAL Pre 24.94 %      PHQ-9: Recent Review Flowsheet Data    There is no flowsheet data to display.     Interpretation of Total Score  Total Score Depression Severity:  1-4 = Minimal depression, 5-9 = Mild depression, 10-14 = Moderate depression, 15-19 = Moderately severe depression, 20-27 = Severe depression   Psychosocial Evaluation and Intervention:   Psychosocial Re-Evaluation:   Psychosocial Discharge (Final Psychosocial Re-Evaluation):   Vocational Rehabilitation: Provide vocational rehab assistance to qualifying candidates.   Vocational Rehab Evaluation & Intervention:   Education: Education Goals: Education classes will be provided on a weekly basis, covering required topics. Participant will state understanding/return demonstration of topics presented.  Learning Barriers/Preferences:     Learning Barriers/Preferences - 11/05/16 1419  Learning Barriers/Preferences   Learning Barriers Sight   Learning Preferences Computer/Internet;Skilled Demonstration;Individual Instruction      Education Topics: Count Your Pulse:  -Group instruction provided by verbal instruction, demonstration, patient participation and written materials to support subject.  Instructors address importance of being able to find your pulse and how to count your pulse when at home without a heart monitor.  Patients get hands on experience counting their pulse with staff help and individually.   Heart Attack, Angina, and Risk Factor Modification:  -Group instruction provided by verbal instruction, video, and written materials to support subject.  Instructors address signs and symptoms of angina and heart attacks.     Also discuss risk factors for heart disease and how to make changes to improve heart health risk factors.   Functional Fitness:  -Group instruction provided by verbal instruction, demonstration, patient participation, and written materials to support subject.  Instructors address safety measures for doing things around the house.  Discuss how to get up and down off the floor, how to pick things up properly, how to safely get out of a chair without assistance, and balance training.   Meditation and Mindfulness:  -Group instruction provided by verbal instruction, patient participation, and written materials to support subject.  Instructor addresses importance of mindfulness and meditation practice to help reduce stress and improve awareness.  Instructor also leads participants through a meditation exercise.    Stretching for Flexibility and Mobility:  -Group instruction provided by verbal instruction, patient participation, and written materials to support subject.  Instructors lead participants through series of stretches that are designed to increase flexibility thus improving mobility.  These stretches are additional exercise for major muscle groups that are typically performed during regular warm up and cool down.   Hands Only CPR:  -Group verbal, video, and participation provides a basic overview of AHA guidelines for community CPR. Role-play of emergencies allow participants the opportunity to practice calling for help and chest compression technique with discussion of AED use.   Hypertension: -Group verbal and written instruction that provides a basic overview of hypertension including the most recent diagnostic guidelines, risk factor reduction with self-care instructions and medication management.    Nutrition I class: Heart Healthy Eating:  -Group instruction provided by PowerPoint slides, verbal discussion, and written materials to support subject matter. The instructor gives an  explanation and review of the Therapeutic Lifestyle Changes diet recommendations, which includes a discussion on lipid goals, dietary fat, sodium, fiber, plant stanol/sterol esters, sugar, and the components of a well-balanced, healthy diet.   Nutrition II class: Lifestyle Skills:  -Group instruction provided by PowerPoint slides, verbal discussion, and written materials to support subject matter. The instructor gives an explanation and review of label reading, grocery shopping for heart health, heart healthy recipe modifications, and ways to make healthier choices when eating out.   Diabetes Question & Answer:  -Group instruction provided by PowerPoint slides, verbal discussion, and written materials to support subject matter. The instructor gives an explanation and review of diabetes co-morbidities, pre- and post-prandial blood glucose goals, pre-exercise blood glucose goals, signs, symptoms, and treatment of hypoglycemia and hyperglycemia, and foot care basics.   Diabetes Blitz:  -Group instruction provided by PowerPoint slides, verbal discussion, and written materials to support subject matter. The instructor gives an explanation and review of the physiology behind type 1 and type 2 diabetes, diabetes medications and rational behind using different medications, pre- and post-prandial blood glucose recommendations and Hemoglobin A1c goals, diabetes diet, and exercise  including blood glucose guidelines for exercising safely.    Portion Distortion:  -Group instruction provided by PowerPoint slides, verbal discussion, written materials, and food models to support subject matter. The instructor gives an explanation of serving size versus portion size, changes in portions sizes over the last 20 years, and what consists of a serving from each food group.   Stress Management:  -Group instruction provided by verbal instruction, video, and written materials to support subject matter.  Instructors  review role of stress in heart disease and how to cope with stress positively.     Exercising on Your Own:  -Group instruction provided by verbal instruction, power point, and written materials to support subject.  Instructors discuss benefits of exercise, components of exercise, frequency and intensity of exercise, and end points for exercise.  Also discuss use of nitroglycerin and activating EMS.  Review options of places to exercise outside of rehab.  Review guidelines for sex with heart disease.   Cardiac Drugs I:  -Group instruction provided by verbal instruction and written materials to support subject.  Instructor reviews cardiac drug classes: antiplatelets, anticoagulants, beta blockers, and statins.  Instructor discusses reasons, side effects, and lifestyle considerations for each drug class.   Cardiac Drugs II:  -Group instruction provided by verbal instruction and written materials to support subject.  Instructor reviews cardiac drug classes: angiotensin converting enzyme inhibitors (ACE-I), angiotensin II receptor blockers (ARBs), nitrates, and calcium channel blockers.  Instructor discusses reasons, side effects, and lifestyle considerations for each drug class.   Anatomy and Physiology of the Circulatory System:  Group verbal and written instruction and models provide basic cardiac anatomy and physiology, with the coronary electrical and arterial systems. Review of: AMI, Angina, Valve disease, Heart Failure, Peripheral Artery Disease, Cardiac Arrhythmia, Pacemakers, and the ICD.   Other Education:  -Group or individual verbal, written, or video instructions that support the educational goals of the cardiac rehab program.   Knowledge Questionnaire Score:     Knowledge Questionnaire Score - 11/05/16 1514      Knowledge Questionnaire Score   Pre Score 23/24      Core Components/Risk Factors/Patient Goals at Admission:     Personal Goals and Risk Factors at Admission -  11/05/16 1546      Core Components/Risk Factors/Patient Goals on Admission    Weight Management Yes;Obesity;Weight Maintenance;Weight Loss   Intervention Weight Management: Develop a combined nutrition and exercise program designed to reach desired caloric intake, while maintaining appropriate intake of nutrient and fiber, sodium and fats, and appropriate energy expenditure required for the weight goal.;Weight Management: Provide education and appropriate resources to help participant work on and attain dietary goals.;Weight Management/Obesity: Establish reasonable short term and long term weight goals.;Obesity: Provide education and appropriate resources to help participant work on and attain dietary goals.   Admit Weight 261 lb 3.9 oz (118.5 kg)   Goal Weight: Short Term 250 lb (113.4 kg)   Goal Weight: Long Term 230 lb (104.3 kg)   Expected Outcomes Short Term: Continue to assess and modify interventions until short term weight is achieved;Long Term: Adherence to nutrition and physical activity/exercise program aimed toward attainment of established weight goal;Weight Maintenance: Understanding of the daily nutrition guidelines, which includes 25-35% calories from fat, 7% or less cal from saturated fats, less than 200mg  cholesterol, less than 1.5gm of sodium, & 5 or more servings of fruits and vegetables daily;Weight Loss: Understanding of general recommendations for a balanced deficit meal plan, which promotes 1-2 lb weight loss per  week and includes a negative energy balance of [id number] kcal/d;Understanding recommendations for meals to include 15-35% energy as protein, 25-35% energy from fat, 35-60% energy from carbohydrates, less than 200mg  of dietary cholesterol, 20-35 gm of total fiber daily;Understanding of distribution of calorie intake throughout the day with the consumption of 4-5 meals/snacks   Hypertension Yes   Intervention Provide education on lifestyle modifcations including regular  physical activity/exercise, weight management, moderate sodium restriction and increased consumption of fresh fruit, vegetables, and low fat dairy, alcohol moderation, and smoking cessation.;Monitor prescription use compliance.   Expected Outcomes Short Term: Continued assessment and intervention until BP is < 140/45mm HG in hypertensive participants. < 130/50mm HG in hypertensive participants with diabetes, heart failure or chronic kidney disease.;Long Term: Maintenance of blood pressure at goal levels.      Core Components/Risk Factors/Patient Goals Review:    Core Components/Risk Factors/Patient Goals at Discharge (Final Review):    ITP Comments:     ITP Comments    Row Name 11/05/16 1410           ITP Comments Dr Sherle Poe, Medical Director          Comments: Delfino Lovett attended orientation from 1330 to 1530 to review rules and guidelines for program. Completed 6 minute walk test, Intitial ITP, and exercise prescription.  VSS. 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  Asymptomatic.Wesly used a Radiation protection practitioner for stability. Kunaal uses a cane due to his history of his quadriceps tendon repair, uarthritis and his state of general deconditioning . Will fax ECG strips to Dr Hassell Done office for review as only rare PVC noted on previous 12 lead ECG. Dastan left cardiac rehab without complaints today.Barnet Pall, RN,BSN 11/05/2016 3:54 PM

## 2016-11-05 NOTE — Progress Notes (Signed)
Joseph [name] 76 y.o. male DOB: 10-Jul-1940 MRN: [id number]      Nutrition Note  1. 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 S/P AVR (aortic valve replacement) 09/20/2016    Past Medical History:  Diagnosis Date  . Aortic stenosis   . Arthritis   . BPH (benign prostatic hyperplasia)   . Depression   . Diverticulosis   . Dyspnea   . Family history of adverse reaction to anesthesia    Pts mother had a difficult time awakening after anesthesia  . GERD (gastroesophageal reflux disease)   . Heart murmur   . Hypertension   . 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 Pre-diabetes   . Rupture of left quadriceps tendon 07/21/2014  . Skin cancer, basal cell   . Urinary incontinence    Meds reviewed. Coumadin noted  HT: Ht Readings from Last 1 Encounters:  11/05/16 5' 7.5" (1.715 m)    WT: Wt Readings from Last 3 Encounters:  11/05/16 261 lb 3.9 oz (118.5 kg)  10/30/16 261 lb (118.4 kg)  10/15/16 264 lb (119.7 kg)     BMI 40.3   Current tobacco use? No  Labs:  Lipid Panel  No results found for: CHOL, TRIG, HDL, CHOLHDL, VLDL, LDLCALC, LDLDIRECT  Lab Results  Component Value Date   HGBA1C 5.9 (H) 09/19/2016   CBG (last 3)  No results for input(s): GLUCAP in the last 72 hours.  Nutrition Note Spoke with pt. Nutrition plan and goals reviewed with pt. Pt lives at [facility] [employer]. Pt wants to lose wt. Pt has been trying to lose wt by decreasing portion sizes and exercising more. Wt loss tips reviewed. Pt expressed understanding of the information reviewed. Pt aware of nutrition education classes offered and plans on attending nutrition classes.  Nutrition Diagnosis ? Food-and nutrition-related knowledge deficit related to lack of exposure to information as related to diagnosis of: ? CVD ? Pre-DM ? Obesity related to excessive energy intake as evidenced by a BMI of 40.3  Nutrition Intervention ? Pt's individual nutrition plan and goals reviewed  with pt.  Nutrition Goal(s):  ? Pt to identify food quantities necessary to achieve weight loss of 6-24 lb (2.7-10.9 kg) at graduation from cardiac rehab.  ? Pt to identify and limit food sources of saturated fat, trans fat and sodium.  ? Pt able to name foods rich in vitamin K. (Pt taking Coumadin/Warfarin).  Plan:  Pt to attend nutrition classes ? Nutrition I ? Nutrition II ? Portion Distortion  Will provide client-centered nutrition education as part of interdisciplinary care.   Monitor and evaluate progress toward nutrition goal with team.  Derek Mound, M.Ed, RD, LDN, CDE 11/05/2016 2:51 PM

## 2016-11-11 ENCOUNTER — Encounter (HOSPITAL_COMMUNITY): Payer: PPO

## 2016-11-12 DIAGNOSIS — N3941 Urge incontinence: Secondary | ICD-10-CM | POA: Diagnosis not present

## 2016-11-12 DIAGNOSIS — R3121 Asymptomatic microscopic hematuria: Secondary | ICD-10-CM | POA: Diagnosis not present

## 2016-11-12 DIAGNOSIS — R3915 Urgency of urination: Secondary | ICD-10-CM | POA: Diagnosis not present

## 2016-11-13 ENCOUNTER — Encounter (HOSPITAL_COMMUNITY)
Admission: RE | Admit: 2016-11-13 | Discharge: 2016-11-13 | Disposition: A | Discharge status: Home / Self Care | Payer: PPO | Source: Ambulatory Visit | Attending: Interventional Cardiology | Admitting: Interventional Cardiology

## 2016-11-13 DIAGNOSIS — Z48812 Encounter for surgical aftercare following surgery on the circulatory system: Secondary | ICD-10-CM | POA: Diagnosis not present

## 2016-11-13 DIAGNOSIS — Z951 Presence of aortocoronary bypass graft: Secondary | ICD-10-CM

## 2016-11-13 DIAGNOSIS — Z952 Presence of prosthetic heart valve: Secondary | ICD-10-CM

## 2016-11-13 LAB — GLUCOSE, CAPILLARY
GLUCOSE-CAPILLARY: 144 mg/dL — AB (ref 65–99)
Glucose-Capillary: 116 mg/dL — ABNORMAL HIGH (ref 65–99)

## 2016-11-13 NOTE — Progress Notes (Signed)
Daily Session Note  Patient Details  Name: Joseph [name] MRN: [id number] Date of Birth: [birth year] Referring Provider:     CARDIAC REHAB PHASE II ORIENTATION from 11/05/2016 in [place]  Referring Provider  Larae Grooms MD      Encounter Date: 11/13/2016  Check In:     Session Check In - 11/13/16 1059      Check-In   Location [facility]   Staff Present Barnet Pall, RN, BSN;Amber Fair, MS, ACSM RCEP, Exercise Physiologist;Carlette Wilber Oliphant, RN, Deland Pretty, MS, ACSM CEP, Exercise Physiologist   Supervising physician immediately available to respond to emergencies [facility] immediately available   Physician(s) Dr Wendee Beavers   Medication changes reported     No   Fall or balance concerns reported    No   Tobacco Cessation No Change   Warm-up and Cool-down Performed as group-led instruction   Resistance Training Performed No   VAD Patient? No     Pain Assessment   Currently in Pain? No/denies      Capillary Blood Glucose: Results for orders placed or performed during the hospital encounter of 11/13/16 (from the past 24 hour(s))  Glucose, capillary     Status: Abnormal   Collection Time: 11/13/16  9:55 AM  Result Value Ref Range   Glucose-Capillary 144 (H) 65 - 99 mg/dL  Glucose, capillary     Status: Abnormal   Collection Time: 11/13/16 10:46 AM  Result Value Ref Range   Glucose-Capillary 116 (H) 65 - 99 mg/dL      History  Smoking Status  . Former Smoker  . Types: Cigarettes, Cigars, Pipe  Smokeless Tobacco  . Never Used    Comment: Quit in the 80's    Goals Met:  Exercise tolerated well  Goals Unmet:  Not Applicable  Comments: Jacquel started cardiac rehab today.  Pt tolerated light exercise without difficulty. VSS, telemetry-Sinus Rhythm with a first degree heart block , asymptomatic.  Medication list reconciled. Pt denies barriers to medicaiton compliance.  PSYCHOSOCIAL ASSESSMENT:   PHQ-0. Pt exhibits positive coping skills, hopeful outlook with supportive family. No psychosocial needs identified at this time, no psychosocial interventions necessary.    Pt enjoys water aerobics and watching TV.Marland Kitchen   Pt oriented to exercise equipment and routine.    Understanding verbalized.   Dr. Fransico Him is Medical Director for Cardiac Rehab at [facility].

## 2016-11-15 ENCOUNTER — Encounter (HOSPITAL_COMMUNITY)
Admission: RE | Admit: 2016-11-15 | Discharge: 2016-11-15 | Disposition: A | Discharge status: Home / Self Care | Payer: PPO | Source: Ambulatory Visit | Attending: Interventional Cardiology | Admitting: Interventional Cardiology

## 2016-11-15 DIAGNOSIS — Z952 Presence of prosthetic heart valve: Secondary | ICD-10-CM

## 2016-11-15 DIAGNOSIS — Z951 Presence of aortocoronary bypass graft: Secondary | ICD-10-CM

## 2016-11-15 DIAGNOSIS — Z48812 Encounter for surgical aftercare following surgery on the circulatory system: Secondary | ICD-10-CM | POA: Diagnosis not present

## 2016-11-18 ENCOUNTER — Encounter (HOSPITAL_COMMUNITY)
Admission: RE | Admit: 2016-11-18 | Discharge: 2016-11-18 | Disposition: A | Discharge status: Home / Self Care | Payer: PPO | Source: Ambulatory Visit | Attending: Interventional Cardiology | Admitting: Interventional Cardiology

## 2016-11-18 DIAGNOSIS — Z952 Presence of prosthetic heart valve: Secondary | ICD-10-CM

## 2016-11-18 DIAGNOSIS — Z951 Presence of aortocoronary bypass graft: Secondary | ICD-10-CM

## 2016-11-18 DIAGNOSIS — Z48812 Encounter for surgical aftercare following surgery on the circulatory system: Secondary | ICD-10-CM | POA: Diagnosis not present

## 2016-11-19 DIAGNOSIS — F325 Major depressive disorder, single episode, in full remission: Secondary | ICD-10-CM | POA: Diagnosis not present

## 2016-11-19 DIAGNOSIS — N400 Enlarged prostate without lower urinary tract symptoms: Secondary | ICD-10-CM | POA: Diagnosis not present

## 2016-11-19 DIAGNOSIS — I10 Essential (primary) hypertension: Secondary | ICD-10-CM | POA: Diagnosis not present

## 2016-11-19 DIAGNOSIS — I2510 Atherosclerotic heart disease of native coronary artery without angina pectoris: Secondary | ICD-10-CM | POA: Diagnosis not present

## 2016-11-19 DIAGNOSIS — M1990 Unspecified osteoarthritis, unspecified site: Secondary | ICD-10-CM | POA: Diagnosis not present

## 2016-11-20 ENCOUNTER — Encounter (HOSPITAL_COMMUNITY)
Admission: RE | Admit: 2016-11-20 | Discharge: 2016-11-20 | Disposition: A | Discharge status: Home / Self Care | Payer: PPO | Source: Ambulatory Visit | Attending: Interventional Cardiology | Admitting: Interventional Cardiology

## 2016-11-20 DIAGNOSIS — Z951 Presence of aortocoronary bypass graft: Secondary | ICD-10-CM

## 2016-11-20 DIAGNOSIS — Z952 Presence of prosthetic heart valve: Secondary | ICD-10-CM

## 2016-11-20 DIAGNOSIS — Z48812 Encounter for surgical aftercare following surgery on the circulatory system: Secondary | ICD-10-CM | POA: Diagnosis not present

## 2016-11-20 NOTE — Progress Notes (Signed)
Reviewed home exercise guidelines with patient including endpoints, temperature precautions, target heart rate and rate of perceived exertion. Pt is currently walking 15 minutes twice/week, 3-4 days/week and participating in water aerobics on MWF as his mode of home exercise. Pt voices understanding of instructions given. Sol Passer, MS, ACSM CEP

## 2016-11-22 ENCOUNTER — Encounter (HOSPITAL_COMMUNITY)
Admission: RE | Admit: 2016-11-22 | Discharge: 2016-11-22 | Disposition: A | Discharge status: Home / Self Care | Payer: PPO | Source: Ambulatory Visit | Attending: Interventional Cardiology | Admitting: Interventional Cardiology

## 2016-11-22 DIAGNOSIS — Z951 Presence of aortocoronary bypass graft: Secondary | ICD-10-CM | POA: Insufficient documentation

## 2016-11-22 DIAGNOSIS — Z952 Presence of prosthetic heart valve: Secondary | ICD-10-CM | POA: Diagnosis not present

## 2016-11-22 DIAGNOSIS — Z48812 Encounter for surgical aftercare following surgery on the circulatory system: Secondary | ICD-10-CM | POA: Insufficient documentation

## 2016-11-22 NOTE — Progress Notes (Signed)
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  acetaminophen (TYLENOL) 500 MG tablet, Take 500-1,000 mg by mouth 4 (four) times daily as needed for moderate pain. , Disp: , Rfl:  .  amiodarone (PACERONE) 400 MG tablet, Take 1 tablet (400 mg total) by mouth 2 (two) times daily. X 7 days, then decrease to 200 mg (1/2 tab) daily, Disp: 60 tablet, Rfl: 1 .  aspirin EC 81 MG tablet, Take 81 mg by mouth daily. , Disp: , Rfl:  .  atorvastatin (LIPITOR) 10 MG tablet, Take 1 tablet (10 mg total) by mouth daily at 6 PM., Disp: 30 tablet, Rfl: 3 .  B Complex Vitamins (B-COMPLEX HIGH POTENCY PO), Take 1 tablet by mouth daily. , Disp: , Rfl:  .  Calcium Carbonate-Vitamin D3 (CALCIUM 600-D) 600-400 MG-UNIT TABS, Take 3 tablets by mouth daily., Disp: , Rfl:  .  diltiazem (CARDIZEM CD) 240 MG 24 hr capsule, Take 1 capsule (240 mg total) by mouth daily., Disp: 90 capsule, Rfl: 1 .  docusate sodium (COLACE) 100 MG capsule, Take 100 mg by mouth daily. , Disp: , Rfl:  .  lansoprazole (PREVACID) 30 MG capsule, Take 30 mg by mouth 2 (two) times daily. , Disp: , Rfl:  .  mirabegron ER (MYRBETRIQ) 50 MG TB24 tablet, Take 50 mg by mouth daily. , Disp: , Rfl:  .  nabumetone (RELAFEN) 750 MG tablet, Take 1,500 mg by mouth daily. , Disp: , Rfl:  .  psyllium (METAMUCIL) 0.52 g capsule, Take 1.56 g by mouth daily., Disp: , Rfl:   .  warfarin (COUMADIN) 4 MG tablet, Take 1 tablet (4 mg total) by mouth daily at 6 PM. (Patient taking differently: Take 4 mg by mouth as directed. 2mg  s, m, t, w, f, s, 4mg  thursday), Disp: 30 tablet, Rfl: 3  Past Medical History: Past Medical History:  Diagnosis Date  . Aortic stenosis   . Arthritis   . BPH (benign prostatic hyperplasia)   . Depression   . Diverticulosis   . Dyspnea   . Family history of adverse reaction to anesthesia    Pts mother had a difficult time awakening after anesthesia  . GERD (gastroesophageal reflux disease)   . Heart murmur   . Hypertension   . 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 Pre-diabetes   . Rupture of left quadriceps tendon 07/21/2014  . Skin cancer, basal cell   . Urinary incontinence     Tobacco Use: History  Smoking Status  . Former Smoker  . Types: Cigarettes, Cigars, Pipe  Smokeless Tobacco  . Never Used    Comment: Quit in the 80's    Labs: Recent Review Flowsheet Data    Labs for ITP Cardiac and Pulmonary Rehab Latest Ref Rng & Units 09/20/2016 09/20/2016 09/20/2016 09/20/2016 09/21/2016   Hemoglobin A1c 4.8 - 5.6 % - - - - -  PHART 7.350 - 7.450 7.357 7.333(L) - 7.364 -   PCO2ART 32.0 - 48.0 mmHg 43.1 44.5 - 41.3 -   HCO3 20.0 - 28.0 mmol/L 24.5 23.8 - 23.7 -   TCO2 22 - 32 mmol/L 26 25 24 25 25    ACIDBASEDEF 0.0 - 2.0 mmol/L 2.0 2.0 - 2.0 -   O2SAT % 96.0 91.0 - 93.0 -      Capillary Blood Glucose: Lab Results  Component Value Date   GLUCAP 116 (H) 11/13/2016   GLUCAP 144 (H) 11/13/2016   GLUCAP 111 (H) 09/22/2016   GLUCAP 141 (H) 09/22/2016   GLUCAP 150 (H) 09/21/2016     Exercise Target Goals:    Exercise Program Goal: Individual exercise prescription set with THRR, safety & activity barriers. Participant demonstrates ability to understand and report RPE using BORG scale, to self-measure pulse accurately, and to acknowledge the importance of the exercise prescription.  Exercise  Prescription Goal: Starting with aerobic activity 30 plus minutes a day, 3 days per week for initial exercise prescription. Provide home exercise prescription and guidelines that participant acknowledges understanding prior to discharge.  Activity Barriers & Risk Stratification:     Activity Barriers & Cardiac Risk Stratification - 11/05/16 1452      Activity Barriers & Cardiac Risk Stratification   Activity Barriers Back Problems;Arthritis;Deconditioning;Balance Concerns;Assistive Device;History of Falls;Other (comment);Muscular Weakness   Comments B knee pain,R elbow restrictions (broke at age 75)   Cardiac Risk Stratification High      6 Minute Walk:     6 Minute Walk    Row Name 11/05/16 1514         6 Minute Walk   Phase Initial     Distance 800 feet     Walk Time 6 minutes     # of Rest Breaks 0     MPH 1.5     METS 1.03     RPE 13     Symptoms No     Resting HR 71 bpm     Resting BP 104/72     Resting Oxygen Saturation  96 %     Exercise Oxygen Saturation  during 6 min walk 95 %     Max Ex. HR 99 bpm     Max Ex. BP 128/70     2 Minute Post BP 114/66        Oxygen Initial Assessment:   Oxygen Re-Evaluation:   Oxygen Discharge (Final Oxygen Re-Evaluation):   Initial Exercise Prescription:     Initial Exercise Prescription - 11/05/16 1500      Date of Initial Exercise RX and Referring Provider   Date 11/05/16   Referring Provider Larae Grooms MD     Recumbant Bike   Level 1.5   Minutes 10   METs 1.5     NuStep   Level 2   SPM 70   Minutes 10   METs 1.5     Track   Laps 4   Minutes 10   METs 1.7     Prescription Details   Frequency (times per week) 3   Duration Progress to 30 minutes of continuous aerobic without signs/symptoms of physical distress     Intensity   THRR 40-80% of Max Heartrate 58-117   Ratings of Perceived Exertion 11-15   Perceived Dyspnea 0-4     Progression   Progression Continue to progress workloads to  maintain intensity without signs/symptoms of physical distress.     Resistance Training  Training Prescription Yes   Weight 2lbs   Reps 10-15      Perform Capillary Blood Glucose checks as needed.  Exercise Prescription Changes:      Exercise Prescription Changes    Row Name 11/18/16 0951 11/20/16 1100           Response to Exercise   Blood Pressure (Admit) 132/80 122/70      Blood Pressure (Exercise) 140/80 132/78      Blood Pressure (Exit) 132/72 112/72      Heart Rate (Admit) 100 bpm 70 bpm      Heart Rate (Exercise) 106 bpm 101 bpm      Heart Rate (Exit) 84 bpm 68 bpm      Rating of Perceived Exertion (Exercise) 15 14      Symptoms none none      Duration Progress to 30 minutes of  aerobic without signs/symptoms of physical distress Progress to 30 minutes of  aerobic without signs/symptoms of physical distress      Intensity THRR unchanged THRR unchanged        Progression   Progression Continue to progress workloads to maintain intensity without signs/symptoms of physical distress. Continue to progress workloads to maintain intensity without signs/symptoms of physical distress.      Average METs 1.7 1.7        Resistance Training   Weight 2lbs 2lbs      Reps 10-15 10-15      Time 10 Minutes 10 Minutes        Interval Training   Interval Training No No        Recumbant Bike   Level -  -      Minutes -  -      METs -  -        NuStep   Level 3 3      SPM 70 70      Minutes 10 10      METs 1.7 1.6        Arm Ergometer   Level 1 1      Minutes 10 10        Track   Laps 5 4      Minutes 10 10      METs 1.7 1.7        Home Exercise Plan   Plans to continue exercise at  - [employer] (comment)  Water aerobics MWF and walking at home.      Frequency  - Add 4 additional days to program exercise sessions.      Initial Home Exercises Provided  - 11/20/16         Exercise Comments:      Exercise Comments    Row Name 11/20/16 1105            Exercise Comments Reviewed home exercise guidelines with patient.          Exercise Goals and Review:      Exercise Goals    Row Name 11/05/16 1453             Exercise Goals   Increase Physical Activity Yes       Intervention Provide advice, education, support and counseling about physical activity/exercise needs.;Develop an individualized exercise prescription for aerobic and resistive training based on initial evaluation findings, risk stratification, comorbidities and participant's personal goals.       Expected Outcomes Achievement of increased cardiorespiratory fitness and enhanced flexibility, muscular endurance and strength shown through  measurements of functional capacity and personal statement of participant.       Increase Strength and Stamina Yes       Intervention Provide advice, education, support and counseling about physical activity/exercise needs.;Develop an individualized exercise prescription for aerobic and resistive training based on initial evaluation findings, risk stratification, comorbidities and participant's personal goals.       Expected Outcomes Achievement of increased cardiorespiratory fitness and enhanced flexibility, muscular endurance and strength shown through measurements of functional capacity and personal statement of participant.       Able to understand and use rate of perceived exertion (RPE) scale Yes       Intervention Provide education and explanation on how to use RPE scale       Expected Outcomes Short Term: Able to use RPE daily in rehab to express subjective intensity level;Long Term:  Able to use RPE to guide intensity level when exercising independently       Knowledge and understanding of Target Heart Rate Range (THRR) Yes       Intervention Provide education and explanation of THRR including how the numbers were predicted and where they are located for reference       Expected Outcomes Short Term: Able to state/look up THRR;Long  Term: Able to use THRR to govern intensity when exercising independently;Short Term: Able to use daily as guideline for intensity in rehab       Able to check pulse independently Yes       Intervention Provide education and demonstration on how to check pulse in carotid and radial arteries.;Review the importance of being able to check your own pulse for safety during independent exercise       Expected Outcomes Short Term: Able to explain why pulse checking is important during independent exercise;Long Term: Able to check pulse independently and accurately       Understanding of Exercise Prescription Yes       Intervention Provide education, explanation, and written materials on patient's individual exercise prescription       Expected Outcomes Short Term: Able to explain program exercise prescription;Long Term: Able to explain home exercise prescription to exercise independently          Exercise Goals Re-Evaluation :     Exercise Goals Re-Evaluation    Row Name 11/20/16 1103             Exercise Goal Re-Evaluation   Exercise Goals Review Understanding of Exercise Prescription;Able to understand and use rate of perceived exertion (RPE) scale       Comments Reviewed home exercise guidelines with patient including THRR, RPE scale and endpoints for exercise.       Expected Outcomes Patient will continue walking 15 minutes twice/day, 3-4 days/week and participatng in water aerobics 45 minutes, 3 days/week.           Discharge Exercise Prescription (Final Exercise Prescription Changes):     Exercise Prescription Changes - 11/20/16 1100      Response to Exercise   Blood Pressure (Admit) 122/70   Blood Pressure (Exercise) 132/78   Blood Pressure (Exit) 112/72   Heart Rate (Admit) 70 bpm   Heart Rate (Exercise) 101 bpm   Heart Rate (Exit) 68 bpm   Rating of Perceived Exertion (Exercise) 14   Symptoms none   Duration Progress to 30 minutes of  aerobic without signs/symptoms of  physical distress   Intensity THRR unchanged     Progression   Progression Continue to progress workloads to maintain  intensity without signs/symptoms of physical distress.   Average METs 1.7     Resistance Training   Weight 2lbs   Reps 10-15   Time 10 Minutes     Interval Training   Interval Training No     NuStep   Level 3   SPM 70   Minutes 10   METs 1.6     Arm Ergometer   Level 1   Minutes 10     Track   Laps 4   Minutes 10   METs 1.7     Home Exercise Plan   Plans to continue exercise at [employer] (comment)  Water aerobics MWF and walking at home.   Frequency Add 4 additional days to program exercise sessions.   Initial Home Exercises Provided 11/20/16      Nutrition:  Target Goals: Understanding of nutrition guidelines, daily intake of sodium 1500mg , cholesterol 200mg , calories 30% from fat and 7% or less from saturated fats, daily to have 5 or more servings of fruits and vegetables.  Biometrics:     Pre Biometrics - 11/05/16 1548      Pre Biometrics   Height 5' 7.5" (1.715 m)   Weight 261 lb 3.9 oz (118.5 kg)   Waist Circumference 52 inches   Hip Circumference 51.5 inches   Waist to Hip Ratio 1.01 %   BMI (Calculated) 40.29   Triceps Skinfold 27 mm   % Body Fat 40.8 %   Grip Strength 27 kg   Flexibility 0 in   Single Leg Stand 1.56 seconds       Nutrition Therapy Plan and Nutrition Goals:     Nutrition Therapy & Goals - 11/05/16 1455      Nutrition Therapy   Diet Therapeutic Lifestyle Changes   Drug/Food Interactions Coumadin/Vit K     Personal Nutrition Goals   Nutrition Goal Pt to identify food quantities necessary to achieve weight loss of 6-24 lb (2.7-10.9 kg) at graduation from cardiac rehab.    Personal Goal #2 Pt able to name foods rich in vitamin K. (Pt taking Coumadin/Warfarin).   Personal Goal #3 Pt to identify and limit food sources of saturated fat, trans fat and sodium.      Intervention Plan   Intervention  Prescribe, educate and counsel regarding individualized specific dietary modifications aiming towards targeted core components such as weight, hypertension, lipid management, diabetes, heart failure and other comorbidities.   Expected Outcomes Short Term Goal: Understand basic principles of dietary content, such as calories, fat, sodium, cholesterol and nutrients.;Long Term Goal: Adherence to prescribed nutrition plan.      Nutrition Discharge: Nutrition Scores:     Nutrition Assessments - 11/05/16 1453      MEDFICTS Scores   Pre Score 97      Nutrition Goals Re-Evaluation:   Nutrition Goals Re-Evaluation:   Nutrition Goals Discharge (Final Nutrition Goals Re-Evaluation):   Psychosocial: Target Goals: Acknowledge presence or absence of significant depression and/or stress, maximize coping skills, provide positive support system. Participant is able to verbalize types and ability to use techniques and skills needed for reducing stress and depression.  Initial Review & Psychosocial Screening:     Initial Psych Review & Screening - 11/05/16 1551      Initial Review   Current issues with None Identified     Family Dynamics   Good Support System? Yes  wife and friends   Comments No psychosocial needs identifed. No interventions necessary.     Barriers  Psychosocial barriers to participate in program There are no identifiable barriers or psychosocial needs.     Screening Interventions   Interventions Encouraged to exercise      Quality of Life Scores:     Quality of Life - 11/05/16 1522      Quality of Life Scores   Health/Function Pre 24.77 %   Socioeconomic Pre 26.25 %   Psych/Spiritual Pre 22.92 %   Family Pre 26.63 %   GLOBAL Pre 24.94 %      PHQ-9: Recent Review Flowsheet Data    Depression screen [facility] 2/9 11/13/2016   Decreased Interest 0   Down, Depressed, Hopeless 0   PHQ - 2 Score 0     Interpretation of Total Score  Total Score Depression  Severity:  1-4 = Minimal depression, 5-9 = Mild depression, 10-14 = Moderate depression, 15-19 = Moderately severe depression, 20-27 = Severe depression   Psychosocial Evaluation and Intervention:   Psychosocial Re-Evaluation:   Psychosocial Discharge (Final Psychosocial Re-Evaluation):   Vocational Rehabilitation: Provide vocational rehab assistance to qualifying candidates.   Vocational Rehab Evaluation & Intervention:   Education: Education Goals: Education classes will be provided on a weekly basis, covering required topics. Participant will state understanding/return demonstration of topics presented.  Learning Barriers/Preferences:     Learning Barriers/Preferences - 11/05/16 1419      Learning Barriers/Preferences   Learning Barriers Sight   Learning Preferences Computer/Internet;Skilled Demonstration;Individual Instruction      Education Topics: Count Your Pulse:  -Group instruction provided by verbal instruction, demonstration, patient participation and written materials to support subject.  Instructors address importance of being able to find your pulse and how to count your pulse when at home without a heart monitor.  Patients get hands on experience counting their pulse with staff help and individually.   CARDIAC REHAB PHASE II EXERCISE from 11/22/2016 in [place]  Date  11/22/16  Instruction Review Code  2- meets goals/outcomes      Heart Attack, Angina, and Risk Factor Modification:  -Group instruction provided by verbal instruction, video, and written materials to support subject.  Instructors address signs and symptoms of angina and heart attacks.    Also discuss risk factors for heart disease and how to make changes to improve heart health risk factors.   Functional Fitness:  -Group instruction provided by verbal instruction, demonstration, patient participation, and written materials to support subject.  Instructors  address safety measures for doing things around the house.  Discuss how to get up and down off the floor, how to pick things up properly, how to safely get out of a chair without assistance, and balance training.   Meditation and Mindfulness:  -Group instruction provided by verbal instruction, patient participation, and written materials to support subject.  Instructor addresses importance of mindfulness and meditation practice to help reduce stress and improve awareness.  Instructor also leads participants through a meditation exercise.    Stretching for Flexibility and Mobility:  -Group instruction provided by verbal instruction, patient participation, and written materials to support subject.  Instructors lead participants through series of stretches that are designed to increase flexibility thus improving mobility.  These stretches are additional exercise for major muscle groups that are typically performed during regular warm up and cool down.   Hands Only CPR:  -Group verbal, video, and participation provides a basic overview of AHA guidelines for community CPR. Role-play of emergencies allow participants the opportunity to practice calling for help and chest  compression technique with discussion of AED use.   Hypertension: -Group verbal and written instruction that provides a basic overview of hypertension including the most recent diagnostic guidelines, risk factor reduction with self-care instructions and medication management.    Nutrition I class: Heart Healthy Eating:  -Group instruction provided by PowerPoint slides, verbal discussion, and written materials to support subject matter. The instructor gives an explanation and review of the Therapeutic Lifestyle Changes diet recommendations, which includes a discussion on lipid goals, dietary fat, sodium, fiber, plant stanol/sterol esters, sugar, and the components of a well-balanced, healthy diet.   Nutrition II class: Lifestyle  Skills:  -Group instruction provided by PowerPoint slides, verbal discussion, and written materials to support subject matter. The instructor gives an explanation and review of label reading, grocery shopping for heart health, heart healthy recipe modifications, and ways to make healthier choices when eating out.   Diabetes Question & Answer:  -Group instruction provided by PowerPoint slides, verbal discussion, and written materials to support subject matter. The instructor gives an explanation and review of diabetes co-morbidities, pre- and post-prandial blood glucose goals, pre-exercise blood glucose goals, signs, symptoms, and treatment of hypoglycemia and hyperglycemia, and foot care basics.   Diabetes Blitz:  -Group instruction provided by PowerPoint slides, verbal discussion, and written materials to support subject matter. The instructor gives an explanation and review of the physiology behind type 1 and type 2 diabetes, diabetes medications and rational behind using different medications, pre- and post-prandial blood glucose recommendations and Hemoglobin A1c goals, diabetes diet, and exercise including blood glucose guidelines for exercising safely.    Portion Distortion:  -Group instruction provided by PowerPoint slides, verbal discussion, written materials, and food models to support subject matter. The instructor gives an explanation of serving size versus portion size, changes in portions sizes over the last 20 years, and what consists of a serving from each food group.   Stress Management:  -Group instruction provided by verbal instruction, video, and written materials to support subject matter.  Instructors review role of stress in heart disease and how to cope with stress positively.     Exercising on Your Own:  -Group instruction provided by verbal instruction, power point, and written materials to support subject.  Instructors discuss benefits of exercise, components of  exercise, frequency and intensity of exercise, and end points for exercise.  Also discuss use of nitroglycerin and activating EMS.  Review options of places to exercise outside of rehab.  Review guidelines for sex with heart disease.   Cardiac Drugs I:  -Group instruction provided by verbal instruction and written materials to support subject.  Instructor reviews cardiac drug classes: antiplatelets, anticoagulants, beta blockers, and statins.  Instructor discusses reasons, side effects, and lifestyle considerations for each drug class.   Cardiac Drugs II:  -Group instruction provided by verbal instruction and written materials to support subject.  Instructor reviews cardiac drug classes: angiotensin converting enzyme inhibitors (ACE-I), angiotensin II receptor blockers (ARBs), nitrates, and calcium channel blockers.  Instructor discusses reasons, side effects, and lifestyle considerations for each drug class.   Anatomy and Physiology of the Circulatory System:  Group verbal and written instruction and models provide basic cardiac anatomy and physiology, with the coronary electrical and arterial systems. Review of: AMI, Angina, Valve disease, Heart Failure, Peripheral Artery Disease, Cardiac Arrhythmia, Pacemakers, and the ICD.   Other Education:  -Group or individual verbal, written, or video instructions that support the educational goals of the cardiac rehab program.   Knowledge Questionnaire Score:  Knowledge Questionnaire Score - 11/05/16 1514      Knowledge Questionnaire Score   Pre Score 23/24      Core Components/Risk Factors/Patient Goals at Admission:     Personal Goals and Risk Factors at Admission - 11/05/16 1546      Core Components/Risk Factors/Patient Goals on Admission    Weight Management Yes;Obesity;Weight Maintenance;Weight Loss   Intervention Weight Management: Develop a combined nutrition and exercise program designed to reach desired caloric intake, while  maintaining appropriate intake of nutrient and fiber, sodium and fats, and appropriate energy expenditure required for the weight goal.;Weight Management: Provide education and appropriate resources to help participant work on and attain dietary goals.;Weight Management/Obesity: Establish reasonable short term and long term weight goals.;Obesity: Provide education and appropriate resources to help participant work on and attain dietary goals.   Admit Weight 261 lb 3.9 oz (118.5 kg)   Goal Weight: Short Term 250 lb (113.4 kg)   Goal Weight: Long Term 230 lb (104.3 kg)   Expected Outcomes Short Term: Continue to assess and modify interventions until short term weight is achieved;Long Term: Adherence to nutrition and physical activity/exercise program aimed toward attainment of established weight goal;Weight Maintenance: Understanding of the daily nutrition guidelines, which includes 25-35% calories from fat, 7% or less cal from saturated fats, less than 200mg  cholesterol, less than 1.5gm of sodium, & 5 or more servings of fruits and vegetables daily;Weight Loss: Understanding of general recommendations for a balanced deficit meal plan, which promotes 1-2 lb weight loss per week and includes a negative energy balance of [phone] kcal/d;Understanding recommendations for meals to include 15-35% energy as protein, 25-35% energy from fat, 35-60% energy from carbohydrates, less than 200mg  of dietary cholesterol, 20-35 gm of total fiber daily;Understanding of distribution of calorie intake throughout the day with the consumption of 4-5 meals/snacks   Hypertension Yes   Intervention Provide education on lifestyle modifcations including regular physical activity/exercise, weight management, moderate sodium restriction and increased consumption of fresh fruit, vegetables, and low fat dairy, alcohol moderation, and smoking cessation.;Monitor prescription use compliance.   Expected Outcomes Short Term: Continued assessment  and intervention until BP is < 140/39mm HG in hypertensive participants. < 130/73mm HG in hypertensive participants with diabetes, heart failure or chronic kidney disease.;Long Term: Maintenance of blood pressure at goal levels.      Core Components/Risk Factors/Patient Goals Review:    Core Components/Risk Factors/Patient Goals at Discharge (Final Review):    ITP Comments:     ITP Comments    Row Name 11/05/16 1410           ITP Comments Dr Sherle Poe, Medical Director          Comments:Saajan is making expected progress toward personal goals after completing 6 sessions. Recommend continued exercise and life style modification education including  stress management and relaxation techniques to decrease cardiac risk profile. Darrow is off to a good start to exercise.Barnet Pall, RN,BSN 11/22/2016 4:52 PM

## 2016-11-25 ENCOUNTER — Encounter (HOSPITAL_COMMUNITY)
Admission: RE | Admit: 2016-11-25 | Discharge: 2016-11-25 | Disposition: A | Discharge status: Home / Self Care | Payer: PPO | Source: Ambulatory Visit | Attending: Interventional Cardiology | Admitting: Interventional Cardiology

## 2016-11-25 ENCOUNTER — Ambulatory Visit: Payer: PPO | Admitting: Interventional Cardiology

## 2016-11-25 DIAGNOSIS — Z48812 Encounter for surgical aftercare following surgery on the circulatory system: Secondary | ICD-10-CM | POA: Diagnosis not present

## 2016-11-25 DIAGNOSIS — Z952 Presence of prosthetic heart valve: Secondary | ICD-10-CM

## 2016-11-25 DIAGNOSIS — Z951 Presence of aortocoronary bypass graft: Secondary | ICD-10-CM

## 2016-11-26 DIAGNOSIS — Z5181 Encounter for therapeutic drug level monitoring: Secondary | ICD-10-CM | POA: Diagnosis not present

## 2016-11-26 DIAGNOSIS — I4891 Unspecified atrial fibrillation: Secondary | ICD-10-CM | POA: Diagnosis not present

## 2016-11-26 DIAGNOSIS — Z7901 Long term (current) use of anticoagulants: Secondary | ICD-10-CM | POA: Diagnosis not present

## 2016-11-26 LAB — PROTIME-INR: INR: 1.6 — AB (ref 0.9–1.1)

## 2016-11-27 ENCOUNTER — Encounter (HOSPITAL_COMMUNITY)
Admission: RE | Admit: 2016-11-27 | Discharge: 2016-11-27 | Disposition: A | Discharge status: Home / Self Care | Payer: PPO | Source: Ambulatory Visit | Attending: Interventional Cardiology | Admitting: Interventional Cardiology

## 2016-11-27 ENCOUNTER — Ambulatory Visit: Payer: Self-pay | Admitting: Cardiology

## 2016-11-27 DIAGNOSIS — I480 Paroxysmal atrial fibrillation: Secondary | ICD-10-CM

## 2016-11-27 DIAGNOSIS — Z48812 Encounter for surgical aftercare following surgery on the circulatory system: Secondary | ICD-10-CM | POA: Diagnosis not present

## 2016-11-27 DIAGNOSIS — Z951 Presence of aortocoronary bypass graft: Secondary | ICD-10-CM

## 2016-11-27 DIAGNOSIS — Z5181 Encounter for therapeutic drug level monitoring: Secondary | ICD-10-CM

## 2016-11-27 DIAGNOSIS — Z952 Presence of prosthetic heart valve: Secondary | ICD-10-CM

## 2016-11-29 ENCOUNTER — Ambulatory Visit: Payer: PPO | Admitting: Interventional Cardiology

## 2016-11-29 ENCOUNTER — Encounter: Payer: Self-pay | Admitting: Interventional Cardiology

## 2016-11-29 ENCOUNTER — Encounter (HOSPITAL_COMMUNITY)
Admission: RE | Admit: 2016-11-29 | Discharge: 2016-11-29 | Disposition: A | Discharge status: Home / Self Care | Payer: PPO | Source: Ambulatory Visit | Attending: Interventional Cardiology | Admitting: Interventional Cardiology

## 2016-11-29 VITALS — BP 122/70 | HR 76 | Ht 67.5 in | Wt 259.6 lb

## 2016-11-29 DIAGNOSIS — Z951 Presence of aortocoronary bypass graft: Secondary | ICD-10-CM

## 2016-11-29 DIAGNOSIS — Z952 Presence of prosthetic heart valve: Secondary | ICD-10-CM | POA: Diagnosis not present

## 2016-11-29 DIAGNOSIS — I2510 Atherosclerotic heart disease of native coronary artery without angina pectoris: Secondary | ICD-10-CM | POA: Diagnosis not present

## 2016-11-29 DIAGNOSIS — Z48812 Encounter for surgical aftercare following surgery on the circulatory system: Secondary | ICD-10-CM | POA: Diagnosis not present

## 2016-11-29 DIAGNOSIS — I480 Paroxysmal atrial fibrillation: Secondary | ICD-10-CM

## 2016-11-29 DIAGNOSIS — Z7901 Long term (current) use of anticoagulants: Secondary | ICD-10-CM

## 2016-11-29 MED ORDER — AMOXICILLIN 500 MG PO CAPS: 2000.0000 mg | ORAL_CAPSULE | Freq: Once | ORAL | 0 refills | Status: AC

## 2016-11-29 NOTE — Patient Instructions (Signed)
Medication Instructions:  Your physician has recommended you make the following change in your medication:   STOP: amiodarone   Take amoxicillin 2000 mg (4 tablets) 1 hour prior to dental procedres  Labwork: None ordered  Testing/Procedures: None ordered  Follow-Up: Your physician wants you to follow-up in 2-3 months with Dr. Irish Lack.  Any Other Special Instructions Will Be Listed Below (If Applicable).     If you need a refill on your cardiac medications before your next appointment, please call your pharmacy.

## 2016-11-29 NOTE — Progress Notes (Signed)
Cardiology Office Note   Date:  11/29/2016   ID:  Joseph [name], DOB 1940/04/03, MRN [id number]  PCP:  Gaynelle Arabian, MD    No chief complaint on file. CAD, AVR   Wt Readings from Last 3 Encounters:  11/29/16 259 lb 9.6 oz (117.8 kg)  11/05/16 261 lb 3.9 oz (118.5 kg)  10/30/16 261 lb (118.4 kg)       History of Present Illness: Joseph [name] is a 76 y.o. 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 He developed post operative AFib.  He did feel sx with his AFib in the hospital.  He was treated with Diltiazem and amiodarone. He does not tolerate beta blockers well.  He was discharged on Coumadin.   Denies : Chest pain. Dizziness. Leg edema. Nitroglycerin use. Orthopnea. Palpitations. Paroxysmal nocturnal dyspnea. Shortness of breath. Syncope.   No bleeding problems on Coumadin.  There was microscopic blood in the urine.    He reports some numbness in his big toes.  THis preceded CABG.   He would like to come off of Coumadin as soon as possible.      Past Medical History:  Diagnosis Date  . Aortic stenosis   . Arthritis   . BPH (benign prostatic hyperplasia)   . Depression   . Diverticulosis   . Dyspnea   . Family history of adverse reaction to anesthesia    Pts mother had a difficult time awakening after anesthesia  . GERD (gastroesophageal reflux disease)   . Heart murmur   . Hypertension   . 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 Pre-diabetes   . Rupture of left quadriceps tendon 07/21/2014  . Skin cancer, basal cell   . Urinary incontinence     Past Surgical History:  Procedure Laterality Date  . ANAL FISSURE REPAIR    . BACK SURGERY    . CARDIAC CATHETERIZATION    . COLONOSCOPY W/ POLYPECTOMY    . ELBOW SURGERY Right   . FINGER SURGERY    . left shoulder RTC tear    . LEG SURGERY     tendon repair right leg  . MICRODISCECTOMY LUMBAR     [id number]  . TONSILLECTOMY        Current Outpatient Medications  Medication Sig Dispense Refill  . acetaminophen (TYLENOL) 500 MG tablet Take 500-1,000 mg by mouth 4 (four) times daily as needed for moderate pain.     Marland Kitchen amiodarone (PACERONE) 400 MG tablet Take 1 tablet (400 mg total) by mouth 2 (two) times daily. X 7 days, then decrease to 200 mg (1/2 tab) daily 60 tablet 1  . aspirin EC 81 MG tablet Take 81 mg by mouth daily.     Marland Kitchen atorvastatin (LIPITOR) 10 MG tablet Take 1 tablet (10 mg total) by mouth daily at 6 PM. 30 tablet 3  . B Complex Vitamins (B-COMPLEX HIGH POTENCY PO) Take 1 tablet by mouth daily.     Marland Kitchen diltiazem (CARDIZEM CD) 240 MG 24 hr capsule Take 1 capsule (240 mg total) by mouth daily. 90 capsule 1  . docusate sodium (COLACE) 100 MG capsule Take 100 mg by mouth daily.     . lansoprazole (PREVACID) 30 MG capsule Take 30 mg by mouth 2 (two) times daily.     . mirabegron ER (MYRBETRIQ) 50 MG TB24 tablet Take 50 mg by mouth daily.     . nabumetone (  RELAFEN) 750 MG tablet Take 1,500 mg by mouth daily.     . psyllium (METAMUCIL) 0.52 g capsule Take 1.56 g by mouth daily.    Marland Kitchen warfarin (COUMADIN) 4 MG tablet Take 1 tablet (4 mg total) by mouth daily at 6 PM. (Patient taking differently: Take 4 mg by mouth as directed. 2mg  s, m, t, w, f, s, 4mg  thursday) 30 tablet 3   No current facility-administered medications for this visit.     Allergies:   Ace inhibitors; Atenolol; Beta adrenergic blockers; Erythromycin; and Ciprofloxacin    Social History:  The patient  reports that he has quit smoking. His smoking use included cigarettes, cigars, and pipe. he has never used smokeless tobacco. He reports that he drinks about 1.2 - 1.8 oz of alcohol per week. He reports that he does not use drugs.   Family History:  The patient's family history includes Heart attack in his maternal grandmother; Heart disease in his mother.    ROS:  Please see the history of present illness.   Otherwise, review of systems are  positive for toe numbness.   All other systems are reviewed and negative.    PHYSICAL EXAM: VS:  BP 122/70   Pulse 76   Ht 5' 7.5" (1.715 m)   Wt 259 lb 9.6 oz (117.8 kg)   SpO2 96%   BMI 40.06 kg/m  , BMI Body mass index is 40.06 kg/m. GEN: Well nourished, well developed, in no acute distress  HEENT: normal  Neck: no JVD, carotid bruits, or masses Cardiac: RRR; 2/6 early systolic murmur, no rubs, or gallops,no edema  Respiratory:  clear to auscultation bilaterally, normal work of breathing GI: soft, nontender, nondistended, + BS MS: no deformity or atrophy  Skin: warm and dry, no rash Neuro:  Strength and sensation are intact Psych: euthymic mood, full affect     Recent Labs: 09/19/2016: ALT 46 09/21/2016: Magnesium 2.1 10/05/2016: BUN 11; Creatinine, Ser 1.19; Hemoglobin 13.0; Platelets 454; Potassium 4.7; Sodium 139   Lipid Panel No results found for: CHOL, TRIG, HDL, CHOLHDL, VLDL, LDLCALC, LDLDIRECT   Other studies Reviewed: Additional studies/ records that were reviewed today with results demonstrating: CABG report reviewed.  It appears that the left atrial appendage was not oversewn during the procedure.   ASSESSMENT AND PLAN:  1. Coronary artery disease: Status post CABG.  No angina on medical therapy. 2. Status post aortic valve replacement: He needs SBE prophylaxis.  We will need to plan for a baseline echocardiogram for him as well. 3. Paroxysmal atrial fibrillation: Stop amiodarone at this time.  Continue Coumadin.  If he has no symptoms of atrial fibrillation, could stop Coumadin in a few months. 4. He will need a lipid and liver check in the future when he is fasting as well.   Current medicines are reviewed at length with the patient today.  The patient concerns regarding his medicines were addressed.  The following changes have been made:  No change  Labs/ tests ordered today include:  No orders of the defined types were placed in this  encounter.   Recommend 150 minutes/week of aerobic exercise Low fat, low carb, high fiber diet recommended  Disposition:   FU in 2-3 months   Signed, Larae Grooms, MD  11/29/2016 4:41 PM    [facility] [place], [place], [place]  [address] Phone: [phone]; Fax: [phone]

## 2016-12-02 ENCOUNTER — Encounter (HOSPITAL_COMMUNITY)
Admission: RE | Admit: 2016-12-02 | Discharge: 2016-12-02 | Disposition: A | Discharge status: Home / Self Care | Payer: PPO | Source: Ambulatory Visit | Attending: Interventional Cardiology | Admitting: Interventional Cardiology

## 2016-12-02 DIAGNOSIS — Z951 Presence of aortocoronary bypass graft: Secondary | ICD-10-CM

## 2016-12-02 DIAGNOSIS — Z952 Presence of prosthetic heart valve: Secondary | ICD-10-CM

## 2016-12-02 DIAGNOSIS — Z48812 Encounter for surgical aftercare following surgery on the circulatory system: Secondary | ICD-10-CM | POA: Diagnosis not present

## 2016-12-04 ENCOUNTER — Encounter (HOSPITAL_COMMUNITY)
Admission: RE | Admit: 2016-12-04 | Discharge: 2016-12-04 | Disposition: A | Discharge status: Home / Self Care | Payer: PPO | Source: Ambulatory Visit | Attending: Interventional Cardiology | Admitting: Interventional Cardiology

## 2016-12-04 DIAGNOSIS — Z952 Presence of prosthetic heart valve: Secondary | ICD-10-CM

## 2016-12-04 DIAGNOSIS — Z951 Presence of aortocoronary bypass graft: Secondary | ICD-10-CM

## 2016-12-04 DIAGNOSIS — Z48812 Encounter for surgical aftercare following surgery on the circulatory system: Secondary | ICD-10-CM | POA: Diagnosis not present

## 2016-12-06 ENCOUNTER — Encounter (HOSPITAL_COMMUNITY)
Admission: RE | Admit: 2016-12-06 | Discharge: 2016-12-06 | Disposition: A | Discharge status: Home / Self Care | Payer: PPO | Source: Ambulatory Visit | Attending: Interventional Cardiology | Admitting: Interventional Cardiology

## 2016-12-06 DIAGNOSIS — Z952 Presence of prosthetic heart valve: Secondary | ICD-10-CM

## 2016-12-06 DIAGNOSIS — Z48812 Encounter for surgical aftercare following surgery on the circulatory system: Secondary | ICD-10-CM | POA: Diagnosis not present

## 2016-12-06 DIAGNOSIS — Z951 Presence of aortocoronary bypass graft: Secondary | ICD-10-CM

## 2016-12-06 NOTE — Progress Notes (Signed)
Joseph [name] 76 y.o. male DOB: 07/22/1940 MRN: [id number]      Nutrition Note  1. 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 S/P AVR (aortic valve replacement) 09/20/2016    Nutrition Note Spoke with pt. Nutrition Plan and Nutrition Survey goals reviewed with pt. Pt is not following a Heart Healthy diet. Per discussion, pt is not interested in changing his diet at this time. Age-appropriate nutrition therapy discussed. Pt expressed understanding of the information reviewed. Pt aware of nutrition education classes offered.    Nutrition Diagnosis ? Food-and nutrition-related knowledge deficit related to lack of exposure to information as related to diagnosis of: ? CVD ? Pre-DM ? Obesity related to excessive energy intake as evidenced by a BMI of 40.3  Nutrition Intervention ? Pt's individual nutrition plan and goals reviewed with pt. ? Benefits of adopting Therapeutic Lifestyle Changes discussed when Medficts reviewed.    Nutrition Goal(s):  ? Pt to identify food quantities necessary to achieve weight loss of 6-24 lb (2.7-10.9 kg) at graduation from cardiac rehab.  ? Pt to identify and limit food sources of saturated fat, trans fat and sodium. - not met; resolve due to pt is not interested in changing his diet at this time.  ? Pt able to name foods rich in vitamin K. (Pt taking Coumadin/Warfarin).  Plan:  Pt to attend nutrition classes ? Nutrition I ? Nutrition II ? Portion Distortion  Will provide client-centered nutrition education as part of interdisciplinary care.   Monitor and evaluate progress toward nutrition goal with team.  Edna Franko, M.Ed, RD, LDN, CDE 12/06/2016 11:40 AM 

## 2016-12-09 ENCOUNTER — Encounter (HOSPITAL_COMMUNITY)
Admission: RE | Admit: 2016-12-09 | Discharge: 2016-12-09 | Disposition: A | Discharge status: Home / Self Care | Payer: PPO | Source: Ambulatory Visit | Attending: Interventional Cardiology | Admitting: Interventional Cardiology

## 2016-12-09 DIAGNOSIS — Z48812 Encounter for surgical aftercare following surgery on the circulatory system: Secondary | ICD-10-CM | POA: Diagnosis not present

## 2016-12-09 DIAGNOSIS — Z951 Presence of aortocoronary bypass graft: Secondary | ICD-10-CM

## 2016-12-09 DIAGNOSIS — Z952 Presence of prosthetic heart valve: Secondary | ICD-10-CM

## 2016-12-10 ENCOUNTER — Ambulatory Visit: Payer: Self-pay | Admitting: Cardiology

## 2016-12-10 DIAGNOSIS — Z5181 Encounter for therapeutic drug level monitoring: Secondary | ICD-10-CM

## 2016-12-10 DIAGNOSIS — Z7901 Long term (current) use of anticoagulants: Secondary | ICD-10-CM | POA: Diagnosis not present

## 2016-12-10 DIAGNOSIS — Z952 Presence of prosthetic heart valve: Secondary | ICD-10-CM

## 2016-12-10 DIAGNOSIS — R3121 Asymptomatic microscopic hematuria: Secondary | ICD-10-CM | POA: Diagnosis not present

## 2016-12-10 DIAGNOSIS — I480 Paroxysmal atrial fibrillation: Secondary | ICD-10-CM

## 2016-12-10 DIAGNOSIS — I4891 Unspecified atrial fibrillation: Secondary | ICD-10-CM | POA: Diagnosis not present

## 2016-12-10 DIAGNOSIS — Z951 Presence of aortocoronary bypass graft: Secondary | ICD-10-CM

## 2016-12-10 LAB — PROTIME-INR: INR: 2.1 — AB (ref 0.9–1.1)

## 2016-12-11 ENCOUNTER — Encounter (HOSPITAL_COMMUNITY)
Admission: RE | Admit: 2016-12-11 | Discharge: 2016-12-11 | Disposition: A | Discharge status: Home / Self Care | Payer: PPO | Source: Ambulatory Visit | Attending: Interventional Cardiology | Admitting: Interventional Cardiology

## 2016-12-11 DIAGNOSIS — Z951 Presence of aortocoronary bypass graft: Secondary | ICD-10-CM

## 2016-12-11 DIAGNOSIS — Z48812 Encounter for surgical aftercare following surgery on the circulatory system: Secondary | ICD-10-CM | POA: Diagnosis not present

## 2016-12-11 DIAGNOSIS — Z952 Presence of prosthetic heart valve: Secondary | ICD-10-CM

## 2016-12-16 ENCOUNTER — Encounter (HOSPITAL_COMMUNITY)
Admission: RE | Admit: 2016-12-16 | Discharge: 2016-12-16 | Disposition: A | Discharge status: Home / Self Care | Payer: PPO | Source: Ambulatory Visit | Attending: Interventional Cardiology | Admitting: Interventional Cardiology

## 2016-12-16 DIAGNOSIS — Z951 Presence of aortocoronary bypass graft: Secondary | ICD-10-CM

## 2016-12-16 DIAGNOSIS — Z952 Presence of prosthetic heart valve: Secondary | ICD-10-CM

## 2016-12-16 DIAGNOSIS — Z48812 Encounter for surgical aftercare following surgery on the circulatory system: Secondary | ICD-10-CM | POA: Diagnosis not present

## 2016-12-18 ENCOUNTER — Encounter (HOSPITAL_COMMUNITY)
Admission: RE | Admit: 2016-12-18 | Discharge: 2016-12-18 | Disposition: A | Discharge status: Home / Self Care | Payer: PPO | Source: Ambulatory Visit | Attending: Interventional Cardiology | Admitting: Interventional Cardiology

## 2016-12-18 ENCOUNTER — Other Ambulatory Visit: Payer: Self-pay | Admitting: Interventional Cardiology

## 2016-12-18 DIAGNOSIS — Z952 Presence of prosthetic heart valve: Secondary | ICD-10-CM

## 2016-12-18 DIAGNOSIS — Z48812 Encounter for surgical aftercare following surgery on the circulatory system: Secondary | ICD-10-CM | POA: Diagnosis not present

## 2016-12-18 DIAGNOSIS — Z951 Presence of aortocoronary bypass graft: Secondary | ICD-10-CM

## 2016-12-19 DIAGNOSIS — H26492 Other secondary cataract, left eye: Secondary | ICD-10-CM | POA: Diagnosis not present

## 2016-12-19 DIAGNOSIS — L578 Other skin changes due to chronic exposure to nonionizing radiation: Secondary | ICD-10-CM | POA: Diagnosis not present

## 2016-12-19 DIAGNOSIS — H25011 Cortical age-related cataract, right eye: Secondary | ICD-10-CM | POA: Diagnosis not present

## 2016-12-19 DIAGNOSIS — L570 Actinic keratosis: Secondary | ICD-10-CM | POA: Diagnosis not present

## 2016-12-19 DIAGNOSIS — H02051 Trichiasis without entropian right upper eyelid: Secondary | ICD-10-CM | POA: Diagnosis not present

## 2016-12-19 DIAGNOSIS — H40013 Open angle with borderline findings, low risk, bilateral: Secondary | ICD-10-CM | POA: Diagnosis not present

## 2016-12-19 NOTE — Telephone Encounter (Signed)
Please advise on refill request. Thanks, MI 

## 2016-12-20 ENCOUNTER — Encounter (HOSPITAL_COMMUNITY)
Admission: RE | Admit: 2016-12-20 | Discharge: 2016-12-20 | Disposition: A | Discharge status: Home / Self Care | Payer: PPO | Source: Ambulatory Visit | Attending: Interventional Cardiology | Admitting: Interventional Cardiology

## 2016-12-20 DIAGNOSIS — Z952 Presence of prosthetic heart valve: Secondary | ICD-10-CM

## 2016-12-20 DIAGNOSIS — Z951 Presence of aortocoronary bypass graft: Secondary | ICD-10-CM

## 2016-12-20 DIAGNOSIS — Z48812 Encounter for surgical aftercare following surgery on the circulatory system: Secondary | ICD-10-CM | POA: Diagnosis not present

## 2016-12-20 NOTE — Progress Notes (Signed)
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  acetaminophen (TYLENOL) 500 MG tablet, Take 500-1,000 mg by mouth 4 (four) times daily as needed for moderate pain. , Disp: , Rfl:  .  amoxicillin (AMOXIL) 500 MG capsule, TAKE 4 CAPSULES BY MOUTH 1 HOUR PRIOR TO DENTAL APPOINTMENT, Disp: 4 capsule, Rfl: 0 .  aspirin EC 81 MG tablet, Take 81 mg by mouth daily. , Disp: , Rfl:  .  atorvastatin (LIPITOR) 10 MG tablet, Take 1 tablet (10 mg total) by mouth daily at 6 PM., Disp: 30 tablet, Rfl: 3 .  B Complex Vitamins (B-COMPLEX HIGH POTENCY PO), Take 1 tablet by mouth daily. , Disp: , Rfl:  .  diltiazem (CARDIZEM CD) 240 MG 24 hr capsule, Take 1 capsule (240 mg total) by mouth daily., Disp: 90 capsule, Rfl: 1 .  docusate sodium (COLACE) 100 MG capsule, Take 100 mg by mouth daily. , Disp: , Rfl:  .  lansoprazole (PREVACID) 30 MG capsule, Take 30 mg by mouth 2 (two) times daily. , Disp: , Rfl:  .  mirabegron ER (MYRBETRIQ) 50 MG TB24 tablet, Take 50 mg by mouth daily. , Disp: , Rfl:  .  nabumetone (RELAFEN) 750 MG tablet, Take 1,500 mg by mouth daily. , Disp: , Rfl:  .  psyllium (METAMUCIL) 0.52 g capsule, Take 1.56 g by mouth daily., Disp: , Rfl:  .  warfarin (COUMADIN) 4 MG tablet, Take 1 tablet (4 mg total) by mouth daily at 6 PM. (Patient taking differently: Take 4 mg by mouth as directed. 2mg  s, m, t, w, f,  s, 4mg  thursday), Disp: 30 tablet, Rfl: 3  Past Medical History: Past Medical History:  Diagnosis Date  . Aortic stenosis   . Arthritis   . BPH (benign prostatic hyperplasia)   . Depression   . Diverticulosis   . Dyspnea   . Family history of adverse reaction to anesthesia    Pts mother had a difficult time awakening after anesthesia  . GERD (gastroesophageal reflux disease)   . Heart murmur   . Hypertension   . 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 Pre-diabetes   . Rupture of left quadriceps tendon 07/21/2014  . Skin cancer, basal cell   . Urinary incontinence     Tobacco Use: Social History   Tobacco Use  Smoking Status Former Smoker  . Types: Cigarettes, Cigars, Pipe  Smokeless Tobacco Never Used  Tobacco Comment   Quit in the 80's    Labs: Recent Review Flowsheet Data    Labs for ITP Cardiac and Pulmonary Rehab Latest Ref Rng & Units 09/20/2016 09/20/2016 09/20/2016 09/20/2016 09/21/2016   Hemoglobin A1c 4.8 - 5.6 % - - - - -   PHART 7.350 - 7.450 7.357 7.333(L) - 7.364 -   PCO2ART 32.0 - 48.0 mmHg 43.1 44.5 - 41.3 -   HCO3  20.0 - 28.0 mmol/L 24.5 23.8 - 23.7 -   TCO2 22 - 32 mmol/L 26 25 24 25 25    ACIDBASEDEF 0.0 - 2.0 mmol/L 2.0 2.0 - 2.0 -   O2SAT % 96.0 91.0 - 93.0 -      Capillary Blood Glucose: Lab Results  Component Value Date   GLUCAP 116 (H) 11/13/2016   GLUCAP 144 (H) 11/13/2016   GLUCAP 111 (H) 09/22/2016   GLUCAP 141 (H) 09/22/2016   GLUCAP 150 (H) 09/21/2016     Exercise Target Goals:    Exercise Program Goal: Individual exercise prescription set with THRR, safety & activity barriers. Participant demonstrates ability to understand and report RPE using BORG scale, to self-measure pulse accurately, and to acknowledge the importance of the exercise prescription.  Exercise Prescription Goal: Starting with aerobic activity 30 plus minutes a day, 3 days per week for initial exercise prescription. Provide home exercise  prescription and guidelines that participant acknowledges understanding prior to discharge.  Activity Barriers & Risk Stratification: Activity Barriers & Cardiac Risk Stratification - 11/05/16 1452      Activity Barriers & Cardiac Risk Stratification   Activity Barriers  Back Problems;Arthritis;Deconditioning;Balance Concerns;Assistive Device;History of Falls;Other (comment);Muscular Weakness    Comments  B knee pain,R elbow restrictions (broke at age 77)    Cardiac Risk Stratification  High       6 Minute Walk: 6 Minute Walk    Row Name 11/05/16 1514         6 Minute Walk   Phase  Initial     Distance  800 feet     Walk Time  6 minutes     # of Rest Breaks  0     MPH  1.5     METS  1.03     RPE  13     Symptoms  No     Resting HR  71 bpm     Resting BP  104/72     Resting Oxygen Saturation   96 %     Exercise Oxygen Saturation  during 6 min walk  95 %     Max Ex. HR  99 bpm     Max Ex. BP  128/70     2 Minute Post BP  114/66        Oxygen Initial Assessment:   Oxygen Re-Evaluation:   Oxygen Discharge (Final Oxygen Re-Evaluation):   Initial Exercise Prescription: Initial Exercise Prescription - 11/05/16 1500      Date of Initial Exercise RX and Referring Provider   Date  11/05/16    Referring Provider  Larae Grooms MD      Recumbant Bike   Level  1.5    Minutes  10    METs  1.5      NuStep   Level  2    SPM  70    Minutes  10    METs  1.5      Track   Laps  4    Minutes  10    METs  1.7      Prescription Details   Frequency (times per week)  3    Duration  Progress to 30 minutes of continuous aerobic without signs/symptoms of physical distress      Intensity   THRR 40-80% of Max Heartrate  58-117    Ratings of Perceived Exertion  11-15    Perceived Dyspnea  0-4      Progression   Progression  Continue  to progress workloads to maintain intensity without signs/symptoms of physical distress.      Resistance Training   Training  Prescription  Yes    Weight  2lbs    Reps  10-15       Perform Capillary Blood Glucose checks as needed.  Exercise Prescription Changes: Exercise Prescription Changes    Row Name 11/18/16 0951 11/20/16 1100 12/09/16 0953 12/18/16 1043       Response to Exercise   Blood Pressure (Admit)  132/80  122/70  144/80  116/72    Blood Pressure (Exercise)  140/80  132/78  126/70  128/60    Blood Pressure (Exit)  132/72  112/72  122/72  122/60    Heart Rate (Admit)  100 bpm  70 bpm  98 bpm  90 bpm    Heart Rate (Exercise)  106 bpm  101 bpm  102 bpm  107 bpm    Heart Rate (Exit)  84 bpm  68 bpm  86 bpm  80 bpm    Rating of Perceived Exertion (Exercise)  15  14  13  14     Symptoms  none  none  none  none    Duration  Progress to 30 minutes of  aerobic without signs/symptoms of physical distress  Progress to 30 minutes of  aerobic without signs/symptoms of physical distress  Progress to 30 minutes of  aerobic without signs/symptoms of physical distress  Progress to 30 minutes of  aerobic without signs/symptoms of physical distress    Intensity  THRR unchanged  THRR unchanged  THRR unchanged  THRR unchanged      Progression   Progression  Continue to progress workloads to maintain intensity without signs/symptoms of physical distress.  Continue to progress workloads to maintain intensity without signs/symptoms of physical distress.  Continue to progress workloads to maintain intensity without signs/symptoms of physical distress.  Continue to progress workloads to maintain intensity without signs/symptoms of physical distress.    Average METs  1.7  1.7  1.8  1.9      Resistance Training   Training Prescription  -  -  -  No Relaxation today    Weight  2lbs  2lbs  4lbs  -    Reps  10-15  10-15  10-15  -    Time  10 Minutes  10 Minutes  10 Minutes  -      Interval Training   Interval Training  No  No  No  No      Recumbant Bike   Level  -  -  -  -    Minutes  -  -  -  -    METs  -  -  -  -       NuStep   Level  3  3  4  4     SPM  70  70  70  70    Minutes  10  10  10  10     METs  1.7  1.6  1.7  1.8      Arm Ergometer   Level  1  1  1.3  1.3    Watts  -  -  -  25    Minutes  10  10  10  10     METs  -  -  -  2.12      Track   Laps  5  4  5  5     Minutes  10  10  10  10     METs  1.7  1.7  1.87  1.87      Home Exercise Plan   Plans to continue exercise at  [facility] (comment) Water aerobics MWF and walking at home.  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    Frequency  -  Add 4 additional days to program exercise sessions.  Add 4 additional days to program exercise sessions.  Add 4 additional days to program exercise sessions.    Initial Home Exercises Provided  -  11/20/16  11/20/16  11/20/16       Exercise Comments: Exercise Comments    Row Name 11/20/16 1105 12/09/16 0953 12/18/16 1000       Exercise Comments  Reviewed home exercise guidelines with patient.  Reviewed goals with patient.  Reviewed METs with patient.        Exercise Goals and Review: Exercise Goals    Row Name 11/05/16 1453             Exercise Goals   Increase Physical Activity  Yes       Intervention  Provide advice, education, support and counseling about physical activity/exercise needs.;Develop an individualized exercise prescription for aerobic and resistive training based on initial evaluation findings, risk stratification, comorbidities and participant's personal goals.       Expected Outcomes  Achievement of increased cardiorespiratory fitness and enhanced flexibility, muscular endurance and strength shown through measurements of functional capacity and personal statement of participant.       Increase Strength and Stamina  Yes       Intervention  Provide advice, education, support and counseling about physical activity/exercise needs.;Develop an individualized exercise  prescription for aerobic and resistive training based on initial evaluation findings, risk stratification, comorbidities and participant's personal goals.       Expected Outcomes  Achievement of increased cardiorespiratory fitness and enhanced flexibility, muscular endurance and strength shown through measurements of functional capacity and personal statement of participant.       Able to understand and use rate of perceived exertion (RPE) scale  Yes       Intervention  Provide education and explanation on how to use RPE scale       Expected Outcomes  Short Term: Able to use RPE daily in rehab to express subjective intensity level;Long Term:  Able to use RPE to guide intensity level when exercising independently       Knowledge and understanding of Target Heart Rate Range (THRR)  Yes       Intervention  Provide education and explanation of THRR including how the numbers were predicted and where they are located for reference       Expected Outcomes  Short Term: Able to state/look up THRR;Long Term: Able to use THRR to govern intensity when exercising independently;Short Term: Able to use daily as guideline for intensity in rehab       Able to check pulse independently  Yes       Intervention  Provide education and demonstration on how to check pulse in carotid and radial arteries.;Review the importance of being able to check your own pulse for safety during independent exercise       Expected Outcomes  Short Term: Able to explain why pulse checking is important during independent exercise;Long Term: Able to check pulse independently and accurately       Understanding of  Exercise Prescription  Yes       Intervention  Provide education, explanation, and written materials on patient's individual exercise prescription       Expected Outcomes  Short Term: Able to explain program exercise prescription;Long Term: Able to explain home exercise prescription to exercise independently          Exercise Goals  Re-Evaluation : Exercise Goals Re-Evaluation    Row Name 11/20/16 1103 12/09/16 0953           Exercise Goal Re-Evaluation   Exercise Goals Review  Understanding of Exercise Prescription;Able to understand and use rate of perceived exertion (RPE) scale  Understanding of Exercise Prescription      Comments  Reviewed home exercise guidelines with patient including THRR, RPE scale and endpoints for exercise.  Making progress with exercise. Pt participates in water aerobic 3x's/week in addition to exercise at [facility] and walks 15 minutes twice/day 4 days/week.      Expected Outcomes  Patient will continue walking 15 minutes twice/day, 3-4 days/week and participatng in water aerobics 45 minutes, 3 days/week.  Continue to increase workloads as tolerated.          Discharge Exercise Prescription (Final Exercise Prescription Changes): Exercise Prescription Changes - 12/18/16 1043      Response to Exercise   Blood Pressure (Admit)  116/72    Blood Pressure (Exercise)  128/60    Blood Pressure (Exit)  122/60    Heart Rate (Admit)  90 bpm    Heart Rate (Exercise)  107 bpm    Heart Rate (Exit)  80 bpm    Rating of Perceived Exertion (Exercise)  14    Symptoms  none    Duration  Progress to 30 minutes of  aerobic without signs/symptoms of physical distress    Intensity  THRR unchanged      Progression   Progression  Continue to progress workloads to maintain intensity without signs/symptoms of physical distress.    Average METs  1.9      Resistance Training   Training Prescription  No Relaxation today    Weight  --    Reps  --    Time  --      Interval Training   Interval Training  No      NuStep   Level  4    SPM  70    Minutes  10    METs  1.8      Arm Ergometer   Level  1.3    Watts  25    Minutes  10    METs  2.12      Track   Laps  5    Minutes  10    METs  1.87      Home Exercise Plan   Plans to continue exercise at  [employer] (comment) Water aerobics MWF and  walking at home.    Frequency  Add 4 additional days to program exercise sessions.    Initial Home Exercises Provided  11/20/16       Nutrition:  Target Goals: Understanding of nutrition guidelines, daily intake of sodium 1500mg , cholesterol 200mg , calories 30% from fat and 7% or less from saturated fats, daily to have 5 or more servings of fruits and vegetables.  Biometrics: Pre Biometrics - 11/05/16 1548      Pre Biometrics   Height  5' 7.5" (1.715 m)    Weight  261 lb 3.9 oz (118.5 kg)    Waist Circumference  52 inches    Hip Circumference  51.5 inches    Waist to Hip Ratio  1.01 %    BMI (Calculated)  40.29    Triceps Skinfold  27 mm    % Body Fat  40.8 %    Grip Strength  27 kg    Flexibility  0 in    Single Leg Stand  1.56 seconds        Nutrition Therapy Plan and Nutrition Goals: Nutrition Therapy & Goals - 12/06/16 1203      Nutrition Therapy   Diet  Therapeutic Lifestyle Changes    Drug/Food Interactions  Coumadin/Vit K      Personal Nutrition Goals   Nutrition Goal  Pt to identify food quantities necessary to achieve weight loss of 6-24 lb (2.7-10.9 kg) at graduation from cardiac rehab.     Personal Goal #2  Pt able to name foods rich in vitamin K. (Pt taking Coumadin/Warfarin).      Intervention Plan   Intervention  Prescribe, educate and counsel regarding individualized specific dietary modifications aiming towards targeted core components such as weight, hypertension, lipid management, diabetes, heart failure and other comorbidities.    Expected Outcomes  Short Term Goal: Understand basic principles of dietary content, such as calories, fat, sodium, cholesterol and nutrients.;Long Term Goal: Adherence to prescribed nutrition plan.       Nutrition Discharge: Nutrition Scores: Nutrition Assessments - 11/05/16 1453      MEDFICTS Scores   Pre Score  97       Nutrition Goals Re-Evaluation:   Nutrition Goals Re-Evaluation:   Nutrition Goals  Discharge (Final Nutrition Goals Re-Evaluation):   Psychosocial: Target Goals: Acknowledge presence or absence of significant depression and/or stress, maximize coping skills, provide positive support system. Participant is able to verbalize types and ability to use techniques and skills needed for reducing stress and depression.  Initial Review & Psychosocial Screening: Initial Psych Review & Screening - 11/05/16 1551      Initial Review   Current issues with  None Identified      Family Dynamics   Good Support System?  Yes wife and friends    Comments  No psychosocial needs identifed. No interventions necessary.      Barriers   Psychosocial barriers to participate in program  There are no identifiable barriers or psychosocial needs.      Screening Interventions   Interventions  Encouraged to exercise       Quality of Life Scores: Quality of Life - 11/05/16 1522      Quality of Life Scores   Health/Function Pre  24.77 %    Socioeconomic Pre  26.25 %    Psych/Spiritual Pre  22.92 %    Family Pre  26.63 %    GLOBAL Pre  24.94 %       PHQ-9: Recent Review Flowsheet Data    Depression screen [facility] 2/9 11/13/2016   Decreased Interest 0   Down, Depressed, Hopeless 0   PHQ - 2 Score 0     Interpretation of Total Score  Total Score Depression Severity:  1-4 = Minimal depression, 5-9 = Mild depression, 10-14 = Moderate depression, 15-19 = Moderately severe depression, 20-27 = Severe depression   Psychosocial Evaluation and Intervention:   Psychosocial Re-Evaluation: Psychosocial Re-Evaluation    [place] Name 12/20/16 1652             Psychosocial Re-Evaluation   Current issues with  None Identified  Interventions  Encouraged to attend Cardiac Rehabilitation for the exercise       Continue Psychosocial Services   No Follow up required          Psychosocial Discharge (Final Psychosocial Re-Evaluation): Psychosocial Re-Evaluation - 12/20/16 1652       Psychosocial Re-Evaluation   Current issues with  None Identified    Interventions  Encouraged to attend Cardiac Rehabilitation for the exercise    Continue Psychosocial Services   No Follow up required       Vocational Rehabilitation: Provide vocational rehab assistance to qualifying candidates.   Vocational Rehab Evaluation & Intervention:   Education: Education Goals: Education classes will be provided on a weekly basis, covering required topics. Participant will state understanding/return demonstration of topics presented.  Learning Barriers/Preferences: Learning Barriers/Preferences - 11/05/16 1419      Learning Barriers/Preferences   Learning Barriers  Sight    Learning Preferences  Computer/Internet;Skilled Demonstration;Individual Instruction       Education Topics: Count Your Pulse:  -Group instruction provided by verbal instruction, demonstration, patient participation and written materials to support subject.  Instructors address importance of being able to find your pulse and how to count your pulse when at home without a heart monitor.  Patients get hands on experience counting their pulse with staff help and individually.   CARDIAC REHAB PHASE II EXERCISE from 12/18/2016 in [place]  Date  11/22/16  Instruction Review Code  2- meets goals/outcomes      Heart Attack, Angina, and Risk Factor Modification:  -Group instruction provided by verbal instruction, video, and written materials to support subject.  Instructors address signs and symptoms of angina and heart attacks.    Also discuss risk factors for heart disease and how to make changes to improve heart health risk factors.   Functional Fitness:  -Group instruction provided by verbal instruction, demonstration, patient participation, and written materials to support subject.  Instructors address safety measures for doing things around the house.  Discuss how to get up and down  off the floor, how to pick things up properly, how to safely get out of a chair without assistance, and balance training.   Meditation and Mindfulness:  -Group instruction provided by verbal instruction, patient participation, and written materials to support subject.  Instructor addresses importance of mindfulness and meditation practice to help reduce stress and improve awareness.  Instructor also leads participants through a meditation exercise.    Stretching for Flexibility and Mobility:  -Group instruction provided by verbal instruction, patient participation, and written materials to support subject.  Instructors lead participants through series of stretches that are designed to increase flexibility thus improving mobility.  These stretches are additional exercise for major muscle groups that are typically performed during regular warm up and cool down.   Hands Only CPR:  -Group verbal, video, and participation provides a basic overview of AHA guidelines for community CPR. Role-play of emergencies allow participants the opportunity to practice calling for help and chest compression technique with discussion of AED use.   Hypertension: -Group verbal and written instruction that provides a basic overview of hypertension including the most recent diagnostic guidelines, risk factor reduction with self-care instructions and medication management.    Nutrition I class: Heart Healthy Eating:  -Group instruction provided by PowerPoint slides, verbal discussion, and written materials to support subject matter. The instructor gives an explanation and review of the Therapeutic Lifestyle Changes diet recommendations, which includes a discussion on lipid goals, dietary  fat, sodium, fiber, plant stanol/sterol esters, sugar, and the components of a well-balanced, healthy diet.   Nutrition II class: Lifestyle Skills:  -Group instruction provided by PowerPoint slides, verbal discussion, and written  materials to support subject matter. The instructor gives an explanation and review of label reading, grocery shopping for heart health, heart healthy recipe modifications, and ways to make healthier choices when eating out.   Diabetes Question & Answer:  -Group instruction provided by PowerPoint slides, verbal discussion, and written materials to support subject matter. The instructor gives an explanation and review of diabetes co-morbidities, pre- and post-prandial blood glucose goals, pre-exercise blood glucose goals, signs, symptoms, and treatment of hypoglycemia and hyperglycemia, and foot care basics.   Diabetes Blitz:  -Group instruction provided by PowerPoint slides, verbal discussion, and written materials to support subject matter. The instructor gives an explanation and review of the physiology behind type 1 and type 2 diabetes, diabetes medications and rational behind using different medications, pre- and post-prandial blood glucose recommendations and Hemoglobin A1c goals, diabetes diet, and exercise including blood glucose guidelines for exercising safely.    Portion Distortion:  -Group instruction provided by PowerPoint slides, verbal discussion, written materials, and food models to support subject matter. The instructor gives an explanation of serving size versus portion size, changes in portions sizes over the last 20 years, and what consists of a serving from each food group.   Stress Management:  -Group instruction provided by verbal instruction, video, and written materials to support subject matter.  Instructors review role of stress in heart disease and how to cope with stress positively.     Exercising on Your Own:  -Group instruction provided by verbal instruction, power point, and written materials to support subject.  Instructors discuss benefits of exercise, components of exercise, frequency and intensity of exercise, and end points for exercise.  Also discuss use of  nitroglycerin and activating EMS.  Review options of places to exercise outside of rehab.  Review guidelines for sex with heart disease.   Cardiac Drugs I:  -Group instruction provided by verbal instruction and written materials to support subject.  Instructor reviews cardiac drug classes: antiplatelets, anticoagulants, beta blockers, and statins.  Instructor discusses reasons, side effects, and lifestyle considerations for each drug class.   Cardiac Drugs II:  -Group instruction provided by verbal instruction and written materials to support subject.  Instructor reviews cardiac drug classes: angiotensin converting enzyme inhibitors (ACE-I), angiotensin II receptor blockers (ARBs), nitrates, and calcium channel blockers.  Instructor discusses reasons, side effects, and lifestyle considerations for each drug class.   Anatomy and Physiology of the Circulatory System:  Group verbal and written instruction and models provide basic cardiac anatomy and physiology, with the coronary electrical and arterial systems. Review of: AMI, Angina, Valve disease, Heart Failure, Peripheral Artery Disease, Cardiac Arrhythmia, Pacemakers, and the ICD.   CARDIAC REHAB PHASE II EXERCISE from 12/18/2016 in [place]  Date  12/18/16  Instruction Review Code  2- meets goals/outcomes      Other Education:  -Group or individual verbal, written, or video instructions that support the educational goals of the cardiac rehab program.   Knowledge Questionnaire Score: Knowledge Questionnaire Score - 11/05/16 1514      Knowledge Questionnaire Score   Pre Score  23/24       Core Components/Risk Factors/Patient Goals at Admission: Personal Goals and Risk Factors at Admission - 11/05/16 1546      Core Components/Risk Factors/Patient Goals on Admission  Weight Management  Yes;Obesity;Weight Maintenance;Weight Loss    Intervention  Weight Management: Develop a combined nutrition and  exercise program designed to reach desired caloric intake, while maintaining appropriate intake of nutrient and fiber, sodium and fats, and appropriate energy expenditure required for the weight goal.;Weight Management: Provide education and appropriate resources to help participant work on and attain dietary goals.;Weight Management/Obesity: Establish reasonable short term and long term weight goals.;Obesity: Provide education and appropriate resources to help participant work on and attain dietary goals.    Admit Weight  261 lb 3.9 oz (118.5 kg)    Goal Weight: Short Term  250 lb (113.4 kg)    Goal Weight: Long Term  230 lb (104.3 kg)    Expected Outcomes  Short Term: Continue to assess and modify interventions until short term weight is achieved;Long Term: Adherence to nutrition and physical activity/exercise program aimed toward attainment of established weight goal;Weight Maintenance: Understanding of the daily nutrition guidelines, which includes 25-35% calories from fat, 7% or less cal from saturated fats, less than 200mg  cholesterol, less than 1.5gm of sodium, & 5 or more servings of fruits and vegetables daily;Weight Loss: Understanding of general recommendations for a balanced deficit meal plan, which promotes 1-2 lb weight loss per week and includes a negative energy balance of [phone] kcal/d;Understanding recommendations for meals to include 15-35% energy as protein, 25-35% energy from fat, 35-60% energy from carbohydrates, less than 200mg  of dietary cholesterol, 20-35 gm of total fiber daily;Understanding of distribution of calorie intake throughout the day with the consumption of 4-5 meals/snacks    Hypertension  Yes    Intervention  Provide education on lifestyle modifcations including regular physical activity/exercise, weight management, moderate sodium restriction and increased consumption of fresh fruit, vegetables, and low fat dairy, alcohol moderation, and smoking cessation.;Monitor  prescription use compliance.    Expected Outcomes  Short Term: Continued assessment and intervention until BP is < 140/10mm HG in hypertensive participants. < 130/82mm HG in hypertensive participants with diabetes, heart failure or chronic kidney disease.;Long Term: Maintenance of blood pressure at goal levels.       Core Components/Risk Factors/Patient Goals Review:  Goals and Risk Factor Review    Row Name 12/20/16 1649             Core Components/Risk Factors/Patient Goals Review   Personal Goals Review  Weight Management/Obesity;Hypertension       Review  Vihan has maintained his curent weight. Richards vital signs have been stable       Expected Outcomes  Rommie will continue to participate in phase 2 cardiac rehab to assist in weight reduction. Quadry will take his medicaiotns as presribed.          Core Components/Risk Factors/Patient Goals at Discharge (Final Review):  Goals and Risk Factor Review - 12/20/16 1649      Core Components/Risk Factors/Patient Goals Review   Personal Goals Review  Weight Management/Obesity;Hypertension    Review  Kamarrion has maintained his curent weight. Richards vital signs have been stable    Expected Outcomes  Domanik will continue to participate in phase 2 cardiac rehab to assist in weight reduction. Rakim will take his medicaiotns as presribed.       ITP Comments: ITP Comments    Row Name 11/05/16 1410 12/20/16 1649         ITP Comments  Dr Sherle Poe, Medical Director  30 day ITP review. Patient with good participation and attendance at cardiac rehab.  Comments: See ITP comments.Barnet Pall, RN,BSN 12/20/2016 4:54 PM

## 2016-12-23 ENCOUNTER — Encounter (HOSPITAL_COMMUNITY)
Admission: RE | Admit: 2016-12-23 | Discharge: 2016-12-23 | Disposition: A | Discharge status: Home / Self Care | Payer: PPO | Source: Ambulatory Visit | Attending: Interventional Cardiology | Admitting: Interventional Cardiology

## 2016-12-23 DIAGNOSIS — Z48812 Encounter for surgical aftercare following surgery on the circulatory system: Secondary | ICD-10-CM | POA: Diagnosis not present

## 2016-12-23 DIAGNOSIS — G912 (Idiopathic) normal pressure hydrocephalus: Secondary | ICD-10-CM | POA: Diagnosis not present

## 2016-12-23 DIAGNOSIS — Z951 Presence of aortocoronary bypass graft: Secondary | ICD-10-CM | POA: Insufficient documentation

## 2016-12-23 DIAGNOSIS — Z952 Presence of prosthetic heart valve: Secondary | ICD-10-CM | POA: Diagnosis not present

## 2016-12-24 ENCOUNTER — Other Ambulatory Visit: Payer: Self-pay | Admitting: Neurological Surgery

## 2016-12-24 DIAGNOSIS — G912 (Idiopathic) normal pressure hydrocephalus: Secondary | ICD-10-CM

## 2016-12-25 ENCOUNTER — Encounter (HOSPITAL_COMMUNITY)
Admission: RE | Admit: 2016-12-25 | Discharge: 2016-12-25 | Disposition: A | Discharge status: Home / Self Care | Payer: PPO | Source: Ambulatory Visit | Attending: Interventional Cardiology | Admitting: Interventional Cardiology

## 2016-12-25 DIAGNOSIS — Z952 Presence of prosthetic heart valve: Secondary | ICD-10-CM

## 2016-12-25 DIAGNOSIS — Z951 Presence of aortocoronary bypass graft: Secondary | ICD-10-CM

## 2016-12-25 DIAGNOSIS — Z48812 Encounter for surgical aftercare following surgery on the circulatory system: Secondary | ICD-10-CM | POA: Diagnosis not present

## 2016-12-27 ENCOUNTER — Encounter (HOSPITAL_COMMUNITY)
Admission: RE | Admit: 2016-12-27 | Discharge: 2016-12-27 | Disposition: A | Discharge status: Home / Self Care | Payer: PPO | Source: Ambulatory Visit | Attending: Interventional Cardiology | Admitting: Interventional Cardiology

## 2016-12-27 DIAGNOSIS — Z951 Presence of aortocoronary bypass graft: Secondary | ICD-10-CM

## 2016-12-27 DIAGNOSIS — Z48812 Encounter for surgical aftercare following surgery on the circulatory system: Secondary | ICD-10-CM | POA: Diagnosis not present

## 2016-12-27 DIAGNOSIS — Z952 Presence of prosthetic heart valve: Secondary | ICD-10-CM

## 2016-12-30 ENCOUNTER — Other Ambulatory Visit: Payer: PPO

## 2016-12-30 ENCOUNTER — Encounter (HOSPITAL_COMMUNITY): Payer: PPO

## 2017-01-01 ENCOUNTER — Encounter (HOSPITAL_COMMUNITY)
Admission: RE | Admit: 2017-01-01 | Discharge: 2017-01-01 | Disposition: A | Discharge status: Home / Self Care | Payer: PPO | Source: Ambulatory Visit | Attending: Interventional Cardiology | Admitting: Interventional Cardiology

## 2017-01-01 DIAGNOSIS — Z48812 Encounter for surgical aftercare following surgery on the circulatory system: Secondary | ICD-10-CM | POA: Diagnosis not present

## 2017-01-01 DIAGNOSIS — Z952 Presence of prosthetic heart valve: Secondary | ICD-10-CM

## 2017-01-01 DIAGNOSIS — Z951 Presence of aortocoronary bypass graft: Secondary | ICD-10-CM

## 2017-01-03 ENCOUNTER — Ambulatory Visit
Admission: RE | Admit: 2017-01-03 | Discharge: 2017-01-03 | Disposition: A | Discharge status: Home / Self Care | Payer: PPO | Source: Ambulatory Visit | Attending: Neurological Surgery | Admitting: Neurological Surgery

## 2017-01-03 ENCOUNTER — Encounter (HOSPITAL_COMMUNITY)
Admission: RE | Admit: 2017-01-03 | Discharge: 2017-01-03 | Disposition: A | Discharge status: Home / Self Care | Payer: PPO | Source: Ambulatory Visit | Attending: Interventional Cardiology | Admitting: Interventional Cardiology

## 2017-01-03 DIAGNOSIS — Z952 Presence of prosthetic heart valve: Secondary | ICD-10-CM

## 2017-01-03 DIAGNOSIS — Z951 Presence of aortocoronary bypass graft: Secondary | ICD-10-CM

## 2017-01-03 DIAGNOSIS — Z48812 Encounter for surgical aftercare following surgery on the circulatory system: Secondary | ICD-10-CM | POA: Diagnosis not present

## 2017-01-03 DIAGNOSIS — R413 Other amnesia: Secondary | ICD-10-CM | POA: Diagnosis not present

## 2017-01-03 DIAGNOSIS — G912 (Idiopathic) normal pressure hydrocephalus: Secondary | ICD-10-CM

## 2017-01-06 ENCOUNTER — Encounter (HOSPITAL_COMMUNITY)
Admission: RE | Admit: 2017-01-06 | Discharge: 2017-01-06 | Disposition: A | Discharge status: Home / Self Care | Payer: PPO | Source: Ambulatory Visit | Attending: Interventional Cardiology | Admitting: Interventional Cardiology

## 2017-01-06 DIAGNOSIS — Z951 Presence of aortocoronary bypass graft: Secondary | ICD-10-CM

## 2017-01-06 DIAGNOSIS — G912 (Idiopathic) normal pressure hydrocephalus: Secondary | ICD-10-CM | POA: Diagnosis not present

## 2017-01-06 DIAGNOSIS — Z952 Presence of prosthetic heart valve: Secondary | ICD-10-CM

## 2017-01-06 DIAGNOSIS — Z48812 Encounter for surgical aftercare following surgery on the circulatory system: Secondary | ICD-10-CM | POA: Diagnosis not present

## 2017-01-07 ENCOUNTER — Ambulatory Visit: Payer: Self-pay | Admitting: Interventional Cardiology

## 2017-01-07 DIAGNOSIS — Z5181 Encounter for therapeutic drug level monitoring: Secondary | ICD-10-CM

## 2017-01-07 DIAGNOSIS — I4891 Unspecified atrial fibrillation: Secondary | ICD-10-CM | POA: Diagnosis not present

## 2017-01-07 DIAGNOSIS — I480 Paroxysmal atrial fibrillation: Secondary | ICD-10-CM

## 2017-01-07 DIAGNOSIS — Z951 Presence of aortocoronary bypass graft: Secondary | ICD-10-CM

## 2017-01-07 DIAGNOSIS — Z952 Presence of prosthetic heart valve: Secondary | ICD-10-CM

## 2017-01-07 LAB — POCT INR: INR: 2

## 2017-01-08 ENCOUNTER — Other Ambulatory Visit: Payer: Self-pay | Admitting: Neurological Surgery

## 2017-01-08 ENCOUNTER — Encounter (HOSPITAL_COMMUNITY)
Admission: RE | Admit: 2017-01-08 | Discharge: 2017-01-08 | Disposition: A | Discharge status: Home / Self Care | Payer: PPO | Source: Ambulatory Visit | Attending: Interventional Cardiology | Admitting: Interventional Cardiology

## 2017-01-08 DIAGNOSIS — Z951 Presence of aortocoronary bypass graft: Secondary | ICD-10-CM

## 2017-01-08 DIAGNOSIS — Z952 Presence of prosthetic heart valve: Secondary | ICD-10-CM

## 2017-01-08 DIAGNOSIS — G912 (Idiopathic) normal pressure hydrocephalus: Secondary | ICD-10-CM

## 2017-01-08 DIAGNOSIS — Z48812 Encounter for surgical aftercare following surgery on the circulatory system: Secondary | ICD-10-CM | POA: Diagnosis not present

## 2017-01-10 ENCOUNTER — Encounter (HOSPITAL_COMMUNITY)
Admission: RE | Admit: 2017-01-10 | Discharge: 2017-01-10 | Disposition: A | Discharge status: Home / Self Care | Payer: PPO | Source: Ambulatory Visit | Attending: Interventional Cardiology | Admitting: Interventional Cardiology

## 2017-01-10 DIAGNOSIS — Z48812 Encounter for surgical aftercare following surgery on the circulatory system: Secondary | ICD-10-CM | POA: Diagnosis not present

## 2017-01-10 DIAGNOSIS — Z952 Presence of prosthetic heart valve: Secondary | ICD-10-CM

## 2017-01-10 DIAGNOSIS — Z951 Presence of aortocoronary bypass graft: Secondary | ICD-10-CM

## 2017-01-13 ENCOUNTER — Encounter (HOSPITAL_COMMUNITY): Payer: PPO

## 2017-01-15 ENCOUNTER — Encounter (HOSPITAL_COMMUNITY): Payer: PPO

## 2017-01-16 ENCOUNTER — Encounter (HOSPITAL_COMMUNITY): Payer: Self-pay | Admitting: *Deleted

## 2017-01-16 DIAGNOSIS — Z951 Presence of aortocoronary bypass graft: Secondary | ICD-10-CM

## 2017-01-16 DIAGNOSIS — Z952 Presence of prosthetic heart valve: Secondary | ICD-10-CM

## 2017-01-16 NOTE — Progress Notes (Signed)
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  acetaminophen (TYLENOL) 500 MG tablet, Take 500-1,000 mg by mouth 4 (four) times daily as needed for moderate pain. , Disp: , Rfl:  .  amoxicillin (AMOXIL) 500 MG capsule, TAKE 4 CAPSULES BY MOUTH 1 HOUR PRIOR TO DENTAL APPOINTMENT, Disp: 4 capsule, Rfl: 0 .  aspirin EC 81 MG tablet, Take 81 mg by mouth daily. , Disp: , Rfl:  .  atorvastatin (LIPITOR) 10 MG tablet, Take 1 tablet (10 mg total) by mouth daily at 6 PM., Disp: 30 tablet, Rfl: 3 .  B Complex Vitamins (B-COMPLEX HIGH POTENCY PO), Take 1 tablet by mouth daily. , Disp: , Rfl:  .  diltiazem (CARDIZEM CD) 240 MG 24 hr capsule, Take 1 capsule (240 mg total) by mouth daily., Disp: 90 capsule, Rfl: 1 .  docusate sodium (COLACE) 100 MG capsule, Take 100 mg by mouth daily. , Disp: , Rfl:  .  lansoprazole (PREVACID) 30 MG capsule, Take 30 mg by mouth 2 (two) times daily. , Disp: , Rfl:  .  mirabegron ER (MYRBETRIQ) 50 MG TB24 tablet, Take 50 mg by mouth daily. , Disp: , Rfl:  .  nabumetone (RELAFEN) 750 MG tablet, Take 1,500 mg by mouth daily. , Disp: , Rfl:  .  psyllium (METAMUCIL) 0.52 g capsule, Take 1.56 g by mouth daily., Disp: , Rfl:  .  warfarin (COUMADIN) 4 MG tablet, Take 1 tablet (4 mg total) by mouth daily at 6 PM. (Patient taking differently: Take 4 mg by mouth as directed. 2mg  s, m, t, w, f,  s, 4mg  thursday), Disp: 30 tablet, Rfl: 3  Past Medical History: Past Medical History:  Diagnosis Date  . Aortic stenosis   . Arthritis   . BPH (benign prostatic hyperplasia)   . Depression   . Diverticulosis   . Dyspnea   . Family history of adverse reaction to anesthesia    Pts mother had a difficult time awakening after anesthesia  . GERD (gastroesophageal reflux disease)   . Heart murmur   . Hypertension   . 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 Pre-diabetes   . Rupture of left quadriceps tendon 07/21/2014  . Skin cancer, basal cell   . Urinary incontinence     Tobacco Use: Social History   Tobacco Use  Smoking Status Former Smoker  . Types: Cigarettes, Cigars, Pipe  Smokeless Tobacco Never Used  Tobacco Comment   Quit in the 80's    Labs: Recent Review Flowsheet Data    Labs for ITP Cardiac and Pulmonary Rehab Latest Ref Rng & Units 09/20/2016 09/20/2016 09/20/2016 09/20/2016 09/21/2016   Hemoglobin A1c 4.8 - 5.6 % - - - - -   PHART 7.350 - 7.450 7.357 7.333(L) - 7.364 -   PCO2ART 32.0 - 48.0 mmHg 43.1 44.5 - 41.3 -   HCO3  20.0 - 28.0 mmol/L 24.5 23.8 - 23.7 -   TCO2 22 - 32 mmol/L 26 25 24 25 25    ACIDBASEDEF 0.0 - 2.0 mmol/L 2.0 2.0 - 2.0 -   O2SAT % 96.0 91.0 - 93.0 -      Capillary Blood Glucose: Lab Results  Component Value Date   GLUCAP 116 (H) 11/13/2016   GLUCAP 144 (H) 11/13/2016   GLUCAP 111 (H) 09/22/2016   GLUCAP 141 (H) 09/22/2016   GLUCAP 150 (H) 09/21/2016     Exercise Target Goals:    Exercise Program Goal: Individual exercise prescription set with THRR, safety & activity barriers. Participant demonstrates ability to understand and report RPE using BORG scale, to self-measure pulse accurately, and to acknowledge the importance of the exercise prescription.  Exercise Prescription Goal: Starting with aerobic activity 30 plus minutes a day, 3 days per week for initial exercise prescription. Provide home exercise  prescription and guidelines that participant acknowledges understanding prior to discharge.  Activity Barriers & Risk Stratification:   6 Minute Walk:   Oxygen Initial Assessment:   Oxygen Re-Evaluation:   Oxygen Discharge (Final Oxygen Re-Evaluation):   Initial Exercise Prescription:   Perform Capillary Blood Glucose checks as needed.  Exercise Prescription Changes:  Exercise Prescription Changes    Row Name 11/18/16 0951 11/20/16 1100 12/09/16 0953 12/18/16 1043 01/01/17 1100     Response to Exercise   Blood Pressure (Admit)  132/80  122/70  144/80  116/72  124/80   Blood Pressure (Exercise)  140/80  132/78  126/70  128/60  132/72   Blood Pressure (Exit)  132/72  112/72  122/72  122/60  110/70   Heart Rate (Admit)  100 bpm  70 bpm  98 bpm  90 bpm  77 bpm   Heart Rate (Exercise)  106 bpm  101 bpm  102 bpm  107 bpm  108 bpm   Heart Rate (Exit)  84 bpm  68 bpm  86 bpm  80 bpm  77 bpm   Rating of Perceived Exertion (Exercise)  15  14  13  14  15    Symptoms  none  none  none  none  none   Duration  Progress to 30 minutes of  aerobic without signs/symptoms of physical distress  Progress to 30 minutes of  aerobic without signs/symptoms of physical distress  Progress to 30 minutes of  aerobic without signs/symptoms of physical distress  Progress to 30 minutes of  aerobic without signs/symptoms of physical distress  Progress to 30 minutes of  aerobic without signs/symptoms of physical distress   Intensity  THRR unchanged  THRR unchanged  THRR unchanged  THRR unchanged  THRR unchanged     Progression   Progression  Continue to progress workloads to maintain intensity without signs/symptoms of physical distress.  Continue to progress workloads to maintain intensity without signs/symptoms of physical distress.  Continue to progress workloads to maintain intensity without signs/symptoms of physical distress.  Continue to progress workloads to maintain intensity without signs/symptoms of  physical distress.  Continue to progress workloads to maintain intensity without signs/symptoms of physical distress.   Average METs  1.7  1.7  1.8  1.9  1.9     Resistance Training   Training Prescription  -  -  -  No Relaxation today  Yes   Weight  2lbs  2lbs  4lbs  -  5lbs   Reps  10-15  10-15  10-15  -  10-15   Time  10 Minutes  10 Minutes  10 Minutes  -  10 Minutes     Interval Training   Interval Training  No  No  No  No  No     Recumbant Bike   Level  -  -  -  -  -   Minutes  -  -  -  -  -   METs  -  -  -  -  -     NuStep   Level  3  3  4  4  4    SPM  70  70  70  70  70   Minutes  10  10  10  10  10    METs  1.7  1.6  1.7  1.8  1.7     Arm Ergometer   Level  1  1  1.3  1.3  1.4   Watts  -  -  -  25  -   Minutes  10  10  10  10  10    METs  -  -  -  2.12  2.13     Track   Laps  5  4  5  5  6    Minutes  10  10  10  10  10    METs  1.7  1.7  1.87  1.87  1.87     Home Exercise Plan   Plans to continue exercise at  [employer] (comment) Water aerobics MWF and walking at home.  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   Frequency  -  Add 4 additional days to program exercise sessions.  Add 4 additional days to program exercise sessions.  Add 4 additional days to program exercise sessions.  Add 4 additional days to program exercise sessions.   Initial Home Exercises Provided  -  11/20/16  11/20/16  11/20/16  11/20/16   Row Name 01/10/17 0949             Response to Exercise   Blood Pressure (Admit)  130/86       Blood Pressure (Exercise)  122/80       Blood Pressure (Exit)  118/82       Heart Rate (Admit)  99 bpm       Heart Rate (Exercise)  103 bpm       Heart Rate (Exit)  96 bpm       Rating of Perceived Exertion (Exercise)  15       Symptoms  none       Duration  Progress to 30 minutes of   aerobic without signs/symptoms of physical distress       Intensity  THRR unchanged         Progression   Progression  Continue to progress workloads to maintain intensity without signs/symptoms of physical distress.       Average METs  2.1         Resistance Training   Training Prescription  Yes       Weight  4lbs       Reps  10-15       Time  10 Minutes         Interval Training   Interval Training  No         NuStep   Level  4       SPM  70       Minutes  15       METs  2         Arm Ergometer   Level  1.4       Minutes  15       METs  2.13         Track   Laps  -       Minutes  -       METs  -         Home Exercise Plan   Plans to continue exercise at  [employer] (comment) Water aerobics MWF and walking at home.       Frequency  Add 4 additional days to program exercise sessions.       Initial Home Exercises Provided  11/20/16          Exercise Comments:  Exercise Comments    Row Name 11/20/16 1105 12/09/16 0953 12/18/16 1000 01/01/17 1007 01/17/17 1006   Exercise Comments  Reviewed home exercise guidelines with patient.  Reviewed goals with patient.  Reviewed METs with patient.  Reviewed METs and goals with patient.  Reviewed METs with patient.      Exercise Goals and Review:   Exercise Goals Re-Evaluation : Exercise Goals Re-Evaluation    Row Name 11/20/16 1103 12/09/16 0953 01/01/17 1007         Exercise Goal Re-Evaluation   Exercise Goals Review  Understanding of Exercise Prescription;Able to understand and use rate of perceived exertion (RPE) scale  Understanding of Exercise Prescription  Increase Physical Activity     Comments  Reviewed home exercise guidelines with patient including THRR, RPE scale and endpoints for exercise.  Making progress with exercise. Pt participates in water aerobic 3x's/week in addition to exercise at [facility] and walks 15 minutes twice/day 4 days/week.  Patient states that he has been unable to exercise at home of late  due to some health concerns.      Expected Outcomes  Patient will continue walking 15 minutes twice/day, 3-4 days/week and participatng in water aerobics 45 minutes, 3 days/week.  Continue to increase workloads as tolerated.  Continue to increase workloads as tolerated.         Discharge Exercise Prescription (Final Exercise Prescription Changes): Exercise Prescription Changes - 01/10/17 0949      Response to Exercise   Blood Pressure (Admit)  130/86    Blood Pressure (Exercise)  122/80    Blood Pressure (Exit)  118/82    Heart Rate (Admit)  99 bpm    Heart Rate (Exercise)  103 bpm    Heart Rate (Exit)  96 bpm    Rating of Perceived Exertion (Exercise)  15    Symptoms  none    Duration  Progress to 30 minutes of  aerobic without signs/symptoms of physical distress    Intensity  THRR unchanged      Progression   Progression  Continue to progress workloads to maintain intensity without signs/symptoms of physical distress.    Average METs  2.1      Resistance Training   Training Prescription  Yes    Weight  4lbs    Reps  10-15    Time  10 Minutes      Interval Training   Interval Training  No      NuStep   Level  4    SPM  70    Minutes  15    METs  2      Arm Ergometer   Level  1.4    Minutes  15    METs  2.13      Track   Laps  --    Minutes  --    METs  --      Home Exercise Plan   Plans to continue exercise at  [employer] (comment) Water aerobics MWF and walking at home.    Frequency  Add 4 additional days to program exercise sessions.    Initial Home Exercises Provided  11/20/16       Nutrition:  Target Goals: Understanding of nutrition guidelines, daily intake of sodium 1500mg , cholesterol 200mg , calories 30% from fat and 7% or less from saturated fats, daily to have 5 or more servings of fruits and vegetables.  Biometrics:    Nutrition Therapy Plan and Nutrition Goals: Nutrition Therapy & Goals - 12/06/16 1203      Nutrition Therapy    Diet  Therapeutic Lifestyle Changes    Drug/Food Interactions  Coumadin/Vit K      Personal Nutrition Goals   Nutrition Goal  Pt to identify food quantities necessary to achieve weight loss of 6-24 lb (2.7-10.9 kg) at graduation from cardiac rehab.     Personal Goal #2  Pt able to name foods rich in vitamin K. (Pt taking Coumadin/Warfarin).      Intervention Plan   Intervention  Prescribe, educate and counsel regarding individualized specific dietary modifications aiming towards targeted core components such as weight, hypertension, lipid management, diabetes, heart failure and other comorbidities.    Expected Outcomes  Short Term Goal: Understand basic principles of dietary content, such as calories, fat, sodium, cholesterol and nutrients.;Long Term Goal: Adherence to prescribed nutrition plan.       Nutrition Discharge: Nutrition Scores:   Nutrition Goals Re-Evaluation:   Nutrition Goals Re-Evaluation:   Nutrition Goals Discharge (Final Nutrition Goals Re-Evaluation):   Psychosocial: Target Goals: Acknowledge presence or absence of significant depression and/or stress, maximize coping skills, provide positive support system. Participant is able to verbalize types and ability to use techniques and skills needed for reducing stress and depression.  Initial Review & Psychosocial Screening:   Quality of Life Scores:   PHQ-9: Recent Review Flowsheet Data    Depression screen [facility] 2/9 11/13/2016   Decreased Interest 0   Down, Depressed, Hopeless 0   PHQ - 2 Score 0     Interpretation of Total Score  Total Score Depression Severity:  1-4 = Minimal depression, 5-9 = Mild depression, 10-14 = Moderate depression, 15-19 = Moderately severe depression, 20-27 = Severe depression   Psychosocial Evaluation and Intervention:   Psychosocial Re-Evaluation: Psychosocial Re-Evaluation    Row Name 12/20/16 1652 01/16/17 1158           Psychosocial Re-Evaluation   Current issues  with  None Identified  None Identified      Interventions  Encouraged to attend Cardiac Rehabilitation for the exercise  Encouraged to attend Cardiac Rehabilitation for the exercise      Continue Psychosocial Services   No Follow up required  No Follow up required         Psychosocial Discharge (Final Psychosocial Re-Evaluation): Psychosocial Re-Evaluation - 01/16/17 1158      Psychosocial Re-Evaluation   Current issues with  None Identified    Interventions  Encouraged to attend Cardiac Rehabilitation for the exercise    Continue Psychosocial Services   No Follow up  required       Vocational Rehabilitation: Provide vocational rehab assistance to qualifying candidates.   Vocational Rehab Evaluation & Intervention:   Education: Education Goals: Education classes will be provided on a weekly basis, covering required topics. Participant will state understanding/return demonstration of topics presented.  Learning Barriers/Preferences:   Education Topics: Count Your Pulse:  -Group instruction provided by verbal instruction, demonstration, patient participation and written materials to support subject.  Instructors address importance of being able to find your pulse and how to count your pulse when at home without a heart monitor.  Patients get hands on experience counting their pulse with staff help and individually.   CARDIAC REHAB PHASE II EXERCISE from 01/08/2017 in [place]  Date  11/22/16  Instruction Review Code  2- meets goals/outcomes      Heart Attack, Angina, and Risk Factor Modification:  -Group instruction provided by verbal instruction, video, and written materials to support subject.  Instructors address signs and symptoms of angina and heart attacks.    Also discuss risk factors for heart disease and how to make changes to improve heart health risk factors.   Functional Fitness:  -Group instruction provided by verbal instruction,  demonstration, patient participation, and written materials to support subject.  Instructors address safety measures for doing things around the house.  Discuss how to get up and down off the floor, how to pick things up properly, how to safely get out of a chair without assistance, and balance training.   Meditation and Mindfulness:  -Group instruction provided by verbal instruction, patient participation, and written materials to support subject.  Instructor addresses importance of mindfulness and meditation practice to help reduce stress and improve awareness.  Instructor also leads participants through a meditation exercise.    Stretching for Flexibility and Mobility:  -Group instruction provided by verbal instruction, patient participation, and written materials to support subject.  Instructors lead participants through series of stretches that are designed to increase flexibility thus improving mobility.  These stretches are additional exercise for major muscle groups that are typically performed during regular warm up and cool down.   Hands Only CPR:  -Group verbal, video, and participation provides a basic overview of AHA guidelines for community CPR. Role-play of emergencies allow participants the opportunity to practice calling for help and chest compression technique with discussion of AED use.   Hypertension: -Group verbal and written instruction that provides a basic overview of hypertension including the most recent diagnostic guidelines, risk factor reduction with self-care instructions and medication management.   CARDIAC REHAB PHASE II EXERCISE from 01/08/2017 in [place]  Date  12/27/16  Instruction Review Code  2- meets goals/outcomes       Nutrition I class: Heart Healthy Eating:  -Group instruction provided by PowerPoint slides, verbal discussion, and written materials to support subject matter. The instructor gives an explanation and  review of the Therapeutic Lifestyle Changes diet recommendations, which includes a discussion on lipid goals, dietary fat, sodium, fiber, plant stanol/sterol esters, sugar, and the components of a well-balanced, healthy diet.   CARDIAC REHAB PHASE II EXERCISE from 01/08/2017 in [place]  Date  01/08/17  Educator  RD  Instruction Review Code  2- meets goals/outcomes      Nutrition II class: Lifestyle Skills:  -Group instruction provided by PowerPoint slides, verbal discussion, and written materials to support subject matter. The instructor gives an explanation and review of label reading,  grocery shopping for heart health, heart healthy recipe modifications, and ways to make healthier choices when eating out.   Diabetes Question & Answer:  -Group instruction provided by PowerPoint slides, verbal discussion, and written materials to support subject matter. The instructor gives an explanation and review of diabetes co-morbidities, pre- and post-prandial blood glucose goals, pre-exercise blood glucose goals, signs, symptoms, and treatment of hypoglycemia and hyperglycemia, and foot care basics.   Diabetes Blitz:  -Group instruction provided by PowerPoint slides, verbal discussion, and written materials to support subject matter. The instructor gives an explanation and review of the physiology behind type 1 and type 2 diabetes, diabetes medications and rational behind using different medications, pre- and post-prandial blood glucose recommendations and Hemoglobin A1c goals, diabetes diet, and exercise including blood glucose guidelines for exercising safely.    Portion Distortion:  -Group instruction provided by PowerPoint slides, verbal discussion, written materials, and food models to support subject matter. The instructor gives an explanation of serving size versus portion size, changes in portions sizes over the last 20 years, and what consists of a serving from  each food group.   CARDIAC REHAB PHASE II EXERCISE from 01/08/2017 in [place]  Date  01/08/17  Educator  RD  Instruction Review Code  2- meets goals/outcomes      Stress Management:  -Group instruction provided by verbal instruction, video, and written materials to support subject matter.  Instructors review role of stress in heart disease and how to cope with stress positively.     Exercising on Your Own:  -Group instruction provided by verbal instruction, power point, and written materials to support subject.  Instructors discuss benefits of exercise, components of exercise, frequency and intensity of exercise, and end points for exercise.  Also discuss use of nitroglycerin and activating EMS.  Review options of places to exercise outside of rehab.  Review guidelines for sex with heart disease.   Cardiac Drugs I:  -Group instruction provided by verbal instruction and written materials to support subject.  Instructor reviews cardiac drug classes: antiplatelets, anticoagulants, beta blockers, and statins.  Instructor discusses reasons, side effects, and lifestyle considerations for each drug class.   Cardiac Drugs II:  -Group instruction provided by verbal instruction and written materials to support subject.  Instructor reviews cardiac drug classes: angiotensin converting enzyme inhibitors (ACE-I), angiotensin II receptor blockers (ARBs), nitrates, and calcium channel blockers.  Instructor discusses reasons, side effects, and lifestyle considerations for each drug class.   Anatomy and Physiology of the Circulatory System:  Group verbal and written instruction and models provide basic cardiac anatomy and physiology, with the coronary electrical and arterial systems. Review of: AMI, Angina, Valve disease, Heart Failure, Peripheral Artery Disease, Cardiac Arrhythmia, Pacemakers, and the ICD.   CARDIAC REHAB PHASE II EXERCISE from 01/08/2017 in [place] [facility]  Date  12/18/16  Instruction Review Code  2- meets goals/outcomes      Other Education:  -Group or individual verbal, written, or video instructions that support the educational goals of the cardiac rehab program.   Knowledge Questionnaire Score:   Core Components/Risk Factors/Patient Goals at Admission:   Core Components/Risk Factors/Patient Goals Review:  Goals and Risk Factor Review    Row Name 12/20/16 1649 01/16/17 1156           Core Components/Risk Factors/Patient Goals Review   Personal Goals Review  Weight Management/Obesity;Hypertension  Weight Management/Obesity;Hypertension      Review  Anush has maintained his  curent weight. Richards vital signs have been stable  Khaliq has maintained his curent weight. Richards vital signs have been stable      Expected Outcomes  Sirron will continue to participate in phase 2 cardiac rehab to assist in weight reduction. Olly will take his medicaiotns as presribed.  Randolf will continue to participate in phase 2 cardiac rehab to assist in weight reduction. Selvin will take his medicaiotns as presribed.         Core Components/Risk Factors/Patient Goals at Discharge (Final Review):  Goals and Risk Factor Review - 01/16/17 1156      Core Components/Risk Factors/Patient Goals Review   Personal Goals Review  Weight Management/Obesity;Hypertension    Review  Whittaker has maintained his curent weight. Richards vital signs have been stable    Expected Outcomes  Saeed will continue to participate in phase 2 cardiac rehab to assist in weight reduction. Naziah will take his medicaiotns as presribed.       ITP Comments: ITP Comments    Row Name 12/20/16 1649 01/16/17 1156         ITP Comments  30 day ITP review. Patient with good participation and attendance at cardiac rehab.  30 day ITP review. Patient with good participation and attendance at cardiac rehab. Pavle is particpating in phase 2  cardiac rehab and water aerobics          Comments: See ITP comments.Barnet Pall, RN,BSN 01/17/2017 1:58 PM

## 2017-01-17 ENCOUNTER — Encounter (HOSPITAL_COMMUNITY)
Admission: RE | Admit: 2017-01-17 | Discharge: 2017-01-17 | Disposition: A | Discharge status: Home / Self Care | Payer: PPO | Source: Ambulatory Visit | Attending: Interventional Cardiology | Admitting: Interventional Cardiology

## 2017-01-17 DIAGNOSIS — Z951 Presence of aortocoronary bypass graft: Secondary | ICD-10-CM

## 2017-01-17 DIAGNOSIS — Z952 Presence of prosthetic heart valve: Secondary | ICD-10-CM

## 2017-01-17 DIAGNOSIS — Z48812 Encounter for surgical aftercare following surgery on the circulatory system: Secondary | ICD-10-CM | POA: Diagnosis not present

## 2017-01-20 ENCOUNTER — Encounter (HOSPITAL_COMMUNITY)
Admission: RE | Admit: 2017-01-20 | Discharge: 2017-01-20 | Disposition: A | Discharge status: Home / Self Care | Payer: PPO | Source: Ambulatory Visit | Attending: Interventional Cardiology | Admitting: Interventional Cardiology

## 2017-01-20 DIAGNOSIS — Z952 Presence of prosthetic heart valve: Secondary | ICD-10-CM

## 2017-01-20 DIAGNOSIS — Z48812 Encounter for surgical aftercare following surgery on the circulatory system: Secondary | ICD-10-CM | POA: Diagnosis not present

## 2017-01-20 DIAGNOSIS — Z951 Presence of aortocoronary bypass graft: Secondary | ICD-10-CM

## 2017-01-22 ENCOUNTER — Encounter (HOSPITAL_COMMUNITY)
Admission: RE | Admit: 2017-01-22 | Discharge: 2017-01-22 | Disposition: A | Discharge status: Home / Self Care | Payer: PPO | Source: Ambulatory Visit | Attending: Interventional Cardiology | Admitting: Interventional Cardiology

## 2017-01-22 DIAGNOSIS — Z48812 Encounter for surgical aftercare following surgery on the circulatory system: Secondary | ICD-10-CM | POA: Diagnosis not present

## 2017-01-22 DIAGNOSIS — Z952 Presence of prosthetic heart valve: Secondary | ICD-10-CM | POA: Insufficient documentation

## 2017-01-22 DIAGNOSIS — Z951 Presence of aortocoronary bypass graft: Secondary | ICD-10-CM | POA: Insufficient documentation

## 2017-01-24 ENCOUNTER — Encounter (HOSPITAL_COMMUNITY)
Admission: RE | Admit: 2017-01-24 | Discharge: 2017-01-24 | Disposition: A | Discharge status: Home / Self Care | Payer: PPO | Source: Ambulatory Visit | Attending: Interventional Cardiology | Admitting: Interventional Cardiology

## 2017-01-24 DIAGNOSIS — Z951 Presence of aortocoronary bypass graft: Secondary | ICD-10-CM

## 2017-01-24 DIAGNOSIS — Z48812 Encounter for surgical aftercare following surgery on the circulatory system: Secondary | ICD-10-CM | POA: Diagnosis not present

## 2017-01-24 DIAGNOSIS — Z952 Presence of prosthetic heart valve: Secondary | ICD-10-CM

## 2017-01-25 ENCOUNTER — Other Ambulatory Visit: Payer: Self-pay | Admitting: Physician Assistant

## 2017-01-27 ENCOUNTER — Ambulatory Visit
Admission: RE | Admit: 2017-01-27 | Discharge: 2017-01-27 | Disposition: A | Discharge status: Home / Self Care | Payer: PPO | Source: Ambulatory Visit | Attending: Neurological Surgery | Admitting: Neurological Surgery

## 2017-01-27 ENCOUNTER — Encounter (HOSPITAL_COMMUNITY)
Admission: RE | Admit: 2017-01-27 | Discharge: 2017-01-27 | Disposition: A | Discharge status: Home / Self Care | Payer: PPO | Source: Ambulatory Visit | Attending: Interventional Cardiology | Admitting: Interventional Cardiology

## 2017-01-27 DIAGNOSIS — I6200 Nontraumatic subdural hemorrhage, unspecified: Secondary | ICD-10-CM | POA: Diagnosis not present

## 2017-01-27 DIAGNOSIS — Z951 Presence of aortocoronary bypass graft: Secondary | ICD-10-CM

## 2017-01-27 DIAGNOSIS — G912 (Idiopathic) normal pressure hydrocephalus: Secondary | ICD-10-CM | POA: Diagnosis not present

## 2017-01-27 DIAGNOSIS — Z48812 Encounter for surgical aftercare following surgery on the circulatory system: Secondary | ICD-10-CM | POA: Diagnosis not present

## 2017-01-27 DIAGNOSIS — Z952 Presence of prosthetic heart valve: Secondary | ICD-10-CM

## 2017-01-27 NOTE — Progress Notes (Signed)
Cardiology Office Note   Date:  01/28/2017   ID:  Joseph [name], DOB [birth year], MRN [id number]  PCP:  Gaynelle Arabian, MD    No chief complaint on file.  CAD/AVR  Wt Readings from Last 3 Encounters:  01/28/17 258 lb 12.8 oz (117.4 kg)  11/29/16 259 lb 9.6 oz (117.8 kg)  11/05/16 261 lb 3.9 oz (118.5 kg)       History of Present Illness: Joseph [name] is a 77 y.o. 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 He developed post operative AFib.  He did feel sx with his AFib in the hospital.  He was treated with Diltiazem and amiodarone. He does not tolerate beta blockers well.  He was discharged on Coumadin.   He is finishing cardiac rehab.  DOing well with rehab.    Denies : Chest pain. Dizziness. Leg edema. Nitroglycerin use. Orthopnea. Palpitations. Paroxysmal nocturnal dyspnea. Shortness of breath. Syncope.   No AFib sx since leaving the hospital.        Past Medical History:  Diagnosis Date  . Aortic stenosis   . Arthritis   . BPH (benign prostatic hyperplasia)   . Depression   . Diverticulosis   . Dyspnea   . Family history of adverse reaction to anesthesia    Pts mother had a difficult time awakening after anesthesia  . GERD (gastroesophageal reflux disease)   . Heart murmur   . Hypertension   . 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 Pre-diabetes   . Rupture of left quadriceps tendon 07/21/2014  . Skin cancer, basal cell   . Urinary incontinence     Past Surgical History:  Procedure Laterality Date  . ANAL FISSURE REPAIR    . AORTIC VALVE REPLACEMENT N/A 09/20/2016   Procedure: AORTIC VALVE REPLACEMENT (AVR) USING MAGNA EASE 23 MM PERICARDIAL TISSUE HEART VALVE MODEL 3300TFX;  Surgeon: Gaye Pollack, MD;  Location: [facility];  Service: Open Heart Surgery;  Laterality: N/A;  . BACK SURGERY    . CARDIAC CATHETERIZATION    . COLONOSCOPY W/ POLYPECTOMY    . CORONARY ARTERY BYPASS GRAFT  N/A 09/20/2016   Procedure: CORONARY ARTERY BYPASS GRAFTING (CABG)x 3 WITH ENDOSCOPIC HARVESTING OF RIGHT SAPHENOUS VEIN;  Surgeon: Gaye Pollack, MD;  Location: [facility];  Service: Open Heart Surgery;  Laterality: N/A;  . ELBOW SURGERY Right   . FINGER SURGERY    . left shoulder RTC tear    . LEG SURGERY     tendon repair right leg  . MICRODISCECTOMY LUMBAR     l5-s1  . QUADRICEPS TENDON REPAIR Left 07/21/2014   Procedure: REPAIR LEFT QUADRICEP TENDON;  Surgeon: Marchia Bond, MD;  Location: [facility];  Service: Orthopedics;  Laterality: Left;  . RIGHT/LEFT HEART CATH AND CORONARY ANGIOGRAPHY N/A 09/03/2016   Procedure: RIGHT/LEFT HEART CATH AND CORONARY ANGIOGRAPHY;  Surgeon: Jettie Booze, MD;  Location: [facility];  Service: Cardiovascular;  Laterality: N/A;  . TEE WITHOUT CARDIOVERSION N/A 09/20/2016   Procedure: TRANSESOPHAGEAL ECHOCARDIOGRAM (TEE);  Surgeon: Gaye Pollack, MD;  Location: [facility];  Service: Open Heart Surgery;  Laterality: N/A;  . TONSILLECTOMY    . 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   Procedure: SHUNT INSERTION VENTRICULAR-PERITONEAL;  Surgeon: Eustace Moore, MD;  Location: [facility];  Service: Neurosurgery;  Laterality: Right;     Current Outpatient Medications  Medication Sig  Dispense Refill  . acetaminophen (TYLENOL) 500 MG tablet Take 500-1,000 mg by mouth 4 (four) times daily as needed for moderate pain.     Marland Kitchen amiodarone (PACERONE) 200 MG tablet Take 200 mg by mouth daily.    Marland Kitchen amoxicillin (AMOXIL) 500 MG capsule TAKE 4 CAPSULES BY MOUTH 1 HOUR PRIOR TO DENTAL APPOINTMENT 4 capsule 0  . aspirin EC 81 MG tablet Take 81 mg by mouth daily.     Marland Kitchen atorvastatin (LIPITOR) 10 MG tablet Take 1 tablet (10 mg total) by mouth daily at 6 PM. 30 tablet 3  . B Complex Vitamins (B-COMPLEX HIGH POTENCY PO) Take 1 tablet by mouth daily.     Marland Kitchen diltiazem (CARDIZEM CD) 240 MG 24 hr capsule Take 1 capsule (240 mg total) by mouth daily. 90  capsule 1  . docusate sodium (COLACE) 100 MG capsule Take 100 mg by mouth daily.     . lansoprazole (PREVACID) 30 MG capsule Take 30 mg by mouth 2 (two) times daily.     . mirabegron ER (MYRBETRIQ) 50 MG TB24 tablet Take 50 mg by mouth daily.     . nabumetone (RELAFEN) 750 MG tablet Take 1,500 mg by mouth daily.     . psyllium (METAMUCIL) 0.52 g capsule Take 1.56 g by mouth daily.    Marland Kitchen warfarin (COUMADIN) 4 MG tablet Take 1 tablet (4 mg total) by mouth daily at 6 PM. (Patient taking differently: Take 4 mg by mouth as directed. 2mg  s, m, t, w, f, s, 4mg  thursday) 30 tablet 3   No current facility-administered medications for this visit.     Allergies:   Ace inhibitors; Atenolol; Beta adrenergic blockers; Erythromycin; and Ciprofloxacin    Social History:  The patient  reports that he has quit smoking. His smoking use included cigarettes, cigars, and pipe. he has never used smokeless tobacco. He reports that he drinks about 1.2 - 1.8 oz of alcohol per week. He reports that he does not use drugs.   Family History:  The patient's family history includes Heart attack in his maternal grandmother; Heart disease in his mother.    ROS:  Please see the history of present illness.   Otherwise, review of systems are positive for minimal edema-chronic.   All other systems are reviewed and negative.    PHYSICAL EXAM: VS:  BP 116/76   Pulse 74   Ht 5' 7.5" (1.715 m)   Wt 258 lb 12.8 oz (117.4 kg)   BMI 39.94 kg/m  , BMI Body mass index is 39.94 kg/m. GEN: Well nourished, well developed, in no acute distress  HEENT: normal  Neck: no JVD, carotid bruits, or masses Cardiac: RRR; 2/6 systolic murmur, no rubs, or gallops,; tr edema  Respiratory:  clear to auscultation bilaterally, normal work of breathing GI: soft, nontender, nondistended, + BS, obese MS: no deformity or atrophy  Skin: warm and dry, no rash Neuro:  Strength and sensation are intact Psych: euthymic mood, full affect    Recent  Labs: 09/19/2016: ALT 46 09/21/2016: Magnesium 2.1 10/05/2016: BUN 11; Creatinine, Ser 1.19; Hemoglobin 13.0; Platelets 454; Potassium 4.7; Sodium 139   Lipid Panel No results found for: CHOL, TRIG, HDL, CHOLHDL, VLDL, LDLCALC, LDLDIRECT   Other studies Reviewed: Additional studies/ records that were reviewed today with results demonstrating: No recent lipids.   ASSESSMENT AND PLAN:  1. CAD: s/p CABG.  No angina.  Needs refill of atorvastatin.  Will give 90 day supply.  Check lipids. 2. S/p  AVR: Needs SBE prophylaxis. 3. PAF: No sx of AFib off of Amio.  He has been off of Amio since last visit in 11/18.  Stop warfarin at this time.  If he has more palpitations, he should let us know. 4. HTN: Controlled on Cardizem.   Current medicines are reviewed at length with the patient today.  The patient concerns regarding his medicines were addressed.  The following changes have been made:  No change  Labs/ tests ordered today include:  No orders of the defined types were placed in this encounter.   Recommend 150 minutes/week of aerobic exercise Low fat, low carb, high fiber diet recommended  Disposition:   FU in 6 months   Signed, Larae Grooms, MD  01/28/2017 10:13 AM    [facility] [address], [place], [place]  [address] Phone: [phone]; Fax: [phone]

## 2017-01-28 ENCOUNTER — Encounter: Payer: Self-pay | Admitting: Interventional Cardiology

## 2017-01-28 ENCOUNTER — Ambulatory Visit: Payer: PPO | Admitting: Interventional Cardiology

## 2017-01-28 VITALS — BP 116/76 | HR 74 | Ht 67.5 in | Wt 258.8 lb

## 2017-01-28 DIAGNOSIS — I10 Essential (primary) hypertension: Secondary | ICD-10-CM | POA: Diagnosis not present

## 2017-01-28 DIAGNOSIS — I2510 Atherosclerotic heart disease of native coronary artery without angina pectoris: Secondary | ICD-10-CM

## 2017-01-28 DIAGNOSIS — E785 Hyperlipidemia, unspecified: Secondary | ICD-10-CM

## 2017-01-28 DIAGNOSIS — H26492 Other secondary cataract, left eye: Secondary | ICD-10-CM | POA: Diagnosis not present

## 2017-01-28 DIAGNOSIS — Z952 Presence of prosthetic heart valve: Secondary | ICD-10-CM | POA: Diagnosis not present

## 2017-01-28 MED ORDER — ATORVASTATIN CALCIUM 10 MG PO TABS: 10.0000 mg | ORAL_TABLET | Freq: Every day | ORAL | 3 refills | Status: DC

## 2017-01-28 NOTE — Patient Instructions (Addendum)
Medication Instructions:  Your physician has recommended you make the following change in your medication:   STOP warfarin (coumadin)  Labwork: Your physician recommends that you return for FASTING lab work: CMET, LIPIDS   Testing/Procedures: None ordered  Follow-Up: Your physician wants you to follow-up in: 6 months with Dr. Irish Lack. You will receive a reminder letter in the mail two months in advance. If you don't receive a letter, please call our office to schedule the follow-up appointment.   Any Other Special Instructions Will Be Listed Below (If Applicable).     If you need a refill on your cardiac medications before your next appointment, please call your pharmacy.

## 2017-01-29 ENCOUNTER — Encounter (HOSPITAL_COMMUNITY)
Admission: RE | Admit: 2017-01-29 | Discharge: 2017-01-29 | Disposition: A | Discharge status: Home / Self Care | Payer: PPO | Source: Ambulatory Visit | Attending: Interventional Cardiology | Admitting: Interventional Cardiology

## 2017-01-29 ENCOUNTER — Other Ambulatory Visit: Payer: Self-pay | Admitting: Neurological Surgery

## 2017-01-29 DIAGNOSIS — G912 (Idiopathic) normal pressure hydrocephalus: Secondary | ICD-10-CM

## 2017-01-29 DIAGNOSIS — Z952 Presence of prosthetic heart valve: Secondary | ICD-10-CM

## 2017-01-29 DIAGNOSIS — Z951 Presence of aortocoronary bypass graft: Secondary | ICD-10-CM

## 2017-01-29 DIAGNOSIS — Z48812 Encounter for surgical aftercare following surgery on the circulatory system: Secondary | ICD-10-CM | POA: Diagnosis not present

## 2017-01-31 ENCOUNTER — Telehealth: Payer: Self-pay | Admitting: Interventional Cardiology

## 2017-01-31 ENCOUNTER — Encounter (HOSPITAL_COMMUNITY)
Admission: RE | Admit: 2017-01-31 | Discharge: 2017-01-31 | Disposition: A | Discharge status: Home / Self Care | Payer: PPO | Source: Ambulatory Visit | Attending: Interventional Cardiology | Admitting: Interventional Cardiology

## 2017-01-31 ENCOUNTER — Other Ambulatory Visit: Payer: PPO | Admitting: *Deleted

## 2017-01-31 DIAGNOSIS — E785 Hyperlipidemia, unspecified: Secondary | ICD-10-CM | POA: Diagnosis not present

## 2017-01-31 DIAGNOSIS — I10 Essential (primary) hypertension: Secondary | ICD-10-CM | POA: Diagnosis not present

## 2017-01-31 DIAGNOSIS — Z48812 Encounter for surgical aftercare following surgery on the circulatory system: Secondary | ICD-10-CM | POA: Diagnosis not present

## 2017-01-31 DIAGNOSIS — Z952 Presence of prosthetic heart valve: Secondary | ICD-10-CM

## 2017-01-31 DIAGNOSIS — Z951 Presence of aortocoronary bypass graft: Secondary | ICD-10-CM

## 2017-01-31 LAB — COMPREHENSIVE METABOLIC PANEL
A/G RATIO: 1.8 (ref 1.2–2.2)
ALK PHOS: 65 IU/L (ref 39–117)
ALT: 27 IU/L (ref 0–44)
AST: 23 IU/L (ref 0–40)
Albumin: 4.3 g/dL (ref 3.5–4.8)
BILIRUBIN TOTAL: 0.4 mg/dL (ref 0.0–1.2)
BUN / CREAT RATIO: 16 (ref 10–24)
BUN: 16 mg/dL (ref 8–27)
CHLORIDE: 105 mmol/L (ref 96–106)
CO2: 23 mmol/L (ref 20–29)
Calcium: 9.8 mg/dL (ref 8.6–10.2)
Creatinine, Ser: 0.97 mg/dL (ref 0.76–1.27)
GFR calc Af Amer: 89 mL/min/{1.73_m2} (ref >59)
GFR calc non Af Amer: 77 mL/min/{1.73_m2} (ref >59)
GLUCOSE: 113 mg/dL — AB (ref 65–99)
Globulin, Total: 2.4 g/dL (ref 1.5–4.5)
POTASSIUM: 4.4 mmol/L (ref 3.5–5.2)
Sodium: 142 mmol/L (ref 134–144)
Total Protein: 6.7 g/dL (ref 6.0–8.5)

## 2017-01-31 LAB — LIPID PANEL
CHOLESTEROL TOTAL: 130 mg/dL (ref 100–199)
Chol/HDL Ratio: 2.1 ratio (ref 0.0–5.0)
HDL: 62 mg/dL (ref >39)
LDL Calculated: 46 mg/dL (ref 0–99)
Triglycerides: 110 mg/dL (ref 0–149)
VLDL CHOLESTEROL CAL: 22 mg/dL (ref 5–40)

## 2017-01-31 NOTE — Telephone Encounter (Addendum)
Returned call to the pt after receiving a VM from the pt. Pt states that Dr. Irish Lack d/c his Coumadin on 01/28/17; verified that Coumadin was d/c per progress note. Spoke with Mliss Sax at [facility] & she stated the pt just called to make her aware that the Coumadin was d/c. She states she will d/c INR labs today.  Anticoagulation Episode made inactive today.

## 2017-01-31 NOTE — Telephone Encounter (Signed)
Returned call to the pt & per wife pt is at Cardiac Rehab, therefore she will give him a msg to call back when he returns.

## 2017-01-31 NOTE — Telephone Encounter (Signed)
New message    Pt is asking for a call back from the coumadin clinic. Per pt, he states not to call until this afternoon.

## 2017-02-03 ENCOUNTER — Encounter (HOSPITAL_COMMUNITY)
Admission: RE | Admit: 2017-02-03 | Discharge: 2017-02-03 | Disposition: A | Discharge status: Home / Self Care | Payer: PPO | Source: Ambulatory Visit | Attending: Interventional Cardiology | Admitting: Interventional Cardiology

## 2017-02-03 DIAGNOSIS — Z952 Presence of prosthetic heart valve: Secondary | ICD-10-CM

## 2017-02-03 DIAGNOSIS — Z48812 Encounter for surgical aftercare following surgery on the circulatory system: Secondary | ICD-10-CM | POA: Diagnosis not present

## 2017-02-03 DIAGNOSIS — Z951 Presence of aortocoronary bypass graft: Secondary | ICD-10-CM

## 2017-02-05 ENCOUNTER — Encounter (HOSPITAL_COMMUNITY)
Admission: RE | Admit: 2017-02-05 | Discharge: 2017-02-05 | Disposition: A | Discharge status: Home / Self Care | Payer: PPO | Source: Ambulatory Visit | Attending: Interventional Cardiology | Admitting: Interventional Cardiology

## 2017-02-05 DIAGNOSIS — Z48812 Encounter for surgical aftercare following surgery on the circulatory system: Secondary | ICD-10-CM | POA: Diagnosis not present

## 2017-02-05 DIAGNOSIS — Z952 Presence of prosthetic heart valve: Secondary | ICD-10-CM

## 2017-02-05 DIAGNOSIS — Z951 Presence of aortocoronary bypass graft: Secondary | ICD-10-CM

## 2017-02-07 ENCOUNTER — Encounter (HOSPITAL_COMMUNITY)
Admission: RE | Admit: 2017-02-07 | Discharge: 2017-02-07 | Disposition: A | Discharge status: Home / Self Care | Payer: PPO | Source: Ambulatory Visit | Attending: Interventional Cardiology | Admitting: Interventional Cardiology

## 2017-02-07 DIAGNOSIS — Z951 Presence of aortocoronary bypass graft: Secondary | ICD-10-CM

## 2017-02-07 DIAGNOSIS — Z952 Presence of prosthetic heart valve: Secondary | ICD-10-CM

## 2017-02-07 DIAGNOSIS — Z48812 Encounter for surgical aftercare following surgery on the circulatory system: Secondary | ICD-10-CM | POA: Diagnosis not present

## 2017-02-10 ENCOUNTER — Encounter (HOSPITAL_COMMUNITY): Payer: PPO

## 2017-02-11 NOTE — Progress Notes (Signed)
Discharge Progress Report  Patient Details  Name: Joseph [name] MRN: [id number] Date of Birth: 11-Jan-1941 Referring Provider:     CARDIAC REHAB PHASE II ORIENTATION from 11/05/2016 in [place]  Referring Provider  Larae Grooms MD       Number of Visits: 36  Reason for Discharge:  Patient independent in their exercise.  Smoking History:  Social History   Tobacco Use  Smoking Status Former Smoker  . Types: Cigarettes, Cigars, Pipe  Smokeless Tobacco Never Used  Tobacco Comment   Quit in the 80's    Diagnosis:  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  S/P AVR (aortic valve replacement) 09/20/2016  ADL UCSD:   Initial Exercise Prescription: Initial Exercise Prescription - 11/05/16 1500      Date of Initial Exercise RX and Referring Provider   Date  11/05/16    Referring Provider  Larae Grooms MD      Recumbant Bike   Level  1.5    Minutes  10    METs  1.5      NuStep   Level  2    SPM  70    Minutes  10    METs  1.5      Track   Laps  4    Minutes  10    METs  1.7      Prescription Details   Frequency (times per week)  3    Duration  Progress to 30 minutes of continuous aerobic without signs/symptoms of physical distress      Intensity   THRR 40-80% of Max Heartrate  58-117    Ratings of Perceived Exertion  11-15    Perceived Dyspnea  0-4      Progression   Progression  Continue to progress workloads to maintain intensity without signs/symptoms of physical distress.      Resistance Training   Training Prescription  Yes    Weight  2lbs    Reps  10-15       Discharge Exercise Prescription (Final Exercise Prescription Changes): Exercise Prescription Changes - 02/12/17 0946      Response to Exercise   Blood Pressure (Admit)  124/70    Blood Pressure (Exercise)  120/82    Blood Pressure (Exit)  117/82    Heart Rate (Admit)  83 bpm    Heart Rate (Exercise)  116 bpm    Heart Rate (Exit)  83 bpm    Rating of  Perceived Exertion (Exercise)  14    Symptoms  none    Duration  Progress to 30 minutes of  aerobic without signs/symptoms of physical distress    Intensity  THRR unchanged      Progression   Progression  Continue to progress workloads to maintain intensity without signs/symptoms of physical distress.    Average METs  2.3      Resistance Training   Training Prescription  No Relaxation today    Weight  --    Reps  --    Time  --      Interval Training   Interval Training  No      NuStep   Level  4    SPM  70    Minutes  10    METs  2.3      Arm Ergometer   Level  1.4    Minutes  10    METs  1.9      Track   Laps  9  Minutes  10    METs  2.57      Home Exercise Plan   Plans to continue exercise at  [employer] (comment) Water aerobics MWF and walking at home.    Frequency  Add 4 additional days to program exercise sessions.    Initial Home Exercises Provided  11/20/16       Functional Capacity: 6 Minute Walk    Row Name 11/05/16 1514 01/31/17 1017       6 Minute Walk   Phase  Initial  Discharge    Distance  800 feet  1085 feet    Distance % Change  -  35.63 %    Walk Time  6 minutes  6 minutes    # of Rest Breaks  0  0    MPH  1.5  2.05    METS  1.03  1.79    RPE  13  15    VO2 Peak  -  2.05    Symptoms  No  No    Resting HR  71 bpm  95 bpm    Resting BP  104/72  146/82    Resting Oxygen Saturation   96 %  -    Exercise Oxygen Saturation  during 6 min walk  95 %  -    Max Ex. HR  99 bpm  110 bpm    Max Ex. BP  128/70  142/72    2 Minute Post BP  114/66  126/80       Psychological, QOL, Others - Outcomes: PHQ 2/9: Depression screen [employer] 2/9 02/12/2017 11/13/2016  Decreased Interest 0 0  Down, Depressed, Hopeless 0 0  PHQ - 2 Score 0 0    Quality of Life: Quality of Life - 01/31/17 1421      Quality of Life Scores   Health/Function Pre  24.77 %    Health/Function Post  23.1 %    Health/Function % Change  -6.74 %    Socioeconomic Pre   26.25 %    Socioeconomic Post  25 %    Socioeconomic % Change   -4.76 %    Psych/Spiritual Pre  22.92 %    Psych/Spiritual Post  22.4 %    Psych/Spiritual % Change  -2.27 %    Family Pre  26.63 %    Family Post  27 %    Family % Change  1.39 %    GLOBAL Pre  23.92 %    GLOBAL Post  23.92 %    GLOBAL % Change  0 %       Personal Goals: Goals established at orientation with interventions provided to work toward goal. Personal Goals and Risk Factors at Admission - 11/05/16 1546      Core Components/Risk Factors/Patient Goals on Admission    Weight Management  Yes;Obesity;Weight Maintenance;Weight Loss    Intervention  Weight Management: Develop a combined nutrition and exercise program designed to reach desired caloric intake, while maintaining appropriate intake of nutrient and fiber, sodium and fats, and appropriate energy expenditure required for the weight goal.;Weight Management: Provide education and appropriate resources to help participant work on and attain dietary goals.;Weight Management/Obesity: Establish reasonable short term and long term weight goals.;Obesity: Provide education and appropriate resources to help participant work on and attain dietary goals.    Admit Weight  261 lb 3.9 oz (118.5 kg)    Goal Weight: Short Term  250 lb (113.4 kg)    Goal Weight: Long  Term  230 lb (104.3 kg)    Expected Outcomes  Short Term: Continue to assess and modify interventions until short term weight is achieved;Long Term: Adherence to nutrition and physical activity/exercise program aimed toward attainment of established weight goal;Weight Maintenance: Understanding of the daily nutrition guidelines, which includes 25-35% calories from fat, 7% or less cal from saturated fats, less than 228m cholesterol, less than 1.5gm of sodium, & 5 or more servings of fruits and vegetables daily;Weight Loss: Understanding of general recommendations for a balanced deficit meal plan, which promotes 1-2 lb  weight loss per week and includes a negative energy balance of [phone] kcal/d;Understanding recommendations for meals to include 15-35% energy as protein, 25-35% energy from fat, 35-60% energy from carbohydrates, less than 2030mof dietary cholesterol, 20-35 gm of total fiber daily;Understanding of distribution of calorie intake throughout the day with the consumption of 4-5 meals/snacks    Hypertension  Yes    Intervention  Provide education on lifestyle modifcations including regular physical activity/exercise, weight management, moderate sodium restriction and increased consumption of fresh fruit, vegetables, and low fat dairy, alcohol moderation, and smoking cessation.;Monitor prescription use compliance.    Expected Outcomes  Short Term: Continued assessment and intervention until BP is < 140/9028mG in hypertensive participants. < 130/26m26m in hypertensive participants with diabetes, heart failure or chronic kidney disease.;Long Term: Maintenance of blood pressure at goal levels.        Personal Goals Discharge: Goals and Risk Factor Review    Row Name 12/20/16 1649 01/16/17 1156 02/11/17 1453         Core Components/Risk Factors/Patient Goals Review   Personal Goals Review  Weight Management/Obesity;Hypertension  Weight Management/Obesity;Hypertension  Weight Management/Obesity;Hypertension     Review  RichIssaac maintained his curent weight. Richards vital signs have been stable  RichRoger maintained his curent weight. Richards vital signs have been stable  RichD'Arcy maintained his curent weight. Richards vital signs have been stable     Expected Outcomes  RichAstorl continue to participate in phase 2 cardiac rehab to assist in weight reduction. Lannis will take his medicaiotns as presribed.  Mohammedali will continue to participate in phase 2 cardiac rehab to assist in weight reduction. Hamed will take his medicaiotns as presribed.  Abb will continue to take his medicaitons as  presribed and continue to participate in water aerobics upon DC from cardiac rehab        Exercise Goals and Review: Exercise Goals    Row Name 11/05/16 1453             Exercise Goals   Increase Physical Activity  Yes       Intervention  Provide advice, education, support and counseling about physical activity/exercise needs.;Develop an individualized exercise prescription for aerobic and resistive training based on initial evaluation findings, risk stratification, comorbidities and participant's personal goals.       Expected Outcomes  Achievement of increased cardiorespiratory fitness and enhanced flexibility, muscular endurance and strength shown through measurements of functional capacity and personal statement of participant.       Increase Strength and Stamina  Yes       Intervention  Provide advice, education, support and counseling about physical activity/exercise needs.;Develop an individualized exercise prescription for aerobic and resistive training based on initial evaluation findings, risk stratification, comorbidities and participant's personal goals.       Expected Outcomes  Achievement of increased cardiorespiratory fitness and enhanced flexibility, muscular endurance and strength shown through measurements of functional  capacity and personal statement of participant.       Able to understand and use rate of perceived exertion (RPE) scale  Yes       Intervention  Provide education and explanation on how to use RPE scale       Expected Outcomes  Short Term: Able to use RPE daily in rehab to express subjective intensity level;Long Term:  Able to use RPE to guide intensity level when exercising independently       Knowledge and understanding of Target Heart Rate Range (THRR)  Yes       Intervention  Provide education and explanation of THRR including how the numbers were predicted and where they are located for reference       Expected Outcomes  Short Term: Able to state/look up  THRR;Long Term: Able to use THRR to govern intensity when exercising independently;Short Term: Able to use daily as guideline for intensity in rehab       Able to check pulse independently  Yes       Intervention  Provide education and demonstration on how to check pulse in carotid and radial arteries.;Review the importance of being able to check your own pulse for safety during independent exercise       Expected Outcomes  Short Term: Able to explain why pulse checking is important during independent exercise;Long Term: Able to check pulse independently and accurately       Understanding of Exercise Prescription  Yes       Intervention  Provide education, explanation, and written materials on patient's individual exercise prescription       Expected Outcomes  Short Term: Able to explain program exercise prescription;Long Term: Able to explain home exercise prescription to exercise independently          Nutrition & Weight - Outcomes: Pre Biometrics - 11/05/16 1548      Pre Biometrics   Height  5' 7.5" (1.715 m)    Weight  261 lb 3.9 oz (118.5 kg)    Waist Circumference  52 inches    Hip Circumference  51.5 inches    Waist to Hip Ratio  1.01 %    BMI (Calculated)  40.29    Triceps Skinfold  27 mm    % Body Fat  40.8 %    Grip Strength  27 kg    Flexibility  0 in    Single Leg Stand  1.56 seconds      Post Biometrics - 02/12/17 1040       Post  Biometrics   Height  5' 7.5" (1.715 m)    Weight  258 lb 6.1 oz (117.2 kg)    Waist Circumference  49.5 inches    Hip Circumference  51.25 inches    Waist to Hip Ratio  0.97 %    BMI (Calculated)  39.85    Triceps Skinfold  21 mm    % Body Fat  38.3 %    Grip Strength  31.5 kg    Flexibility  0 in LBP!    Single Leg Stand  2.03 seconds       Nutrition: Nutrition Therapy & Goals - 12/06/16 1203      Nutrition Therapy   Diet  Therapeutic Lifestyle Changes    Drug/Food Interactions  Coumadin/Vit K      Personal Nutrition Goals    Nutrition Goal  Pt to identify food quantities necessary to achieve weight loss of 6-24 lb (2.7-10.9 kg) at graduation from  cardiac rehab.     Personal Goal #2  Pt able to name foods rich in vitamin K. (Pt taking Coumadin/Warfarin).      Intervention Plan   Intervention  Prescribe, educate and counsel regarding individualized specific dietary modifications aiming towards targeted core components such as weight, hypertension, lipid management, diabetes, heart failure and other comorbidities.    Expected Outcomes  Short Term Goal: Understand basic principles of dietary content, such as calories, fat, sodium, cholesterol and nutrients.;Long Term Goal: Adherence to prescribed nutrition plan.       Nutrition Discharge: Nutrition Assessments - 02/03/17 1027      MEDFICTS Scores   Pre Score  97    Post Score  60    Score Difference  -37       Education Questionnaire Score: Knowledge Questionnaire Score - 01/31/17 1420      Knowledge Questionnaire Score   Pre Score  23/24    Post Score  23/34       Goals reviewed with patient; copy given to patient.Pt graduated from cardiac rehab program today with completion of 36 exercise sessions in Phase II. Pt maintained good attendance and progressed nicely during his participation in rehab as evidenced by increased MET level.   Medication list reconciled. Repeat  PHQ score- 0 .  Pt has made significant lifestyle changes and should be commended for his success. Pt feels he has achieved his goals during cardiac rehab.   Pt plans to continue exercise by participating in phase 2 water aerobics and the work out center at [employer].We are proud of Bradt progress and weight loss.Gleason increased his distance on his exit walk test.Taishaun Levels Venetia Maxon, RN,BSN 02/25/2017 4:31 PM

## 2017-02-12 ENCOUNTER — Encounter (HOSPITAL_COMMUNITY)
Admission: RE | Admit: 2017-02-12 | Discharge: 2017-02-12 | Disposition: A | Discharge status: Home / Self Care | Payer: PPO | Source: Ambulatory Visit | Attending: Interventional Cardiology | Admitting: Interventional Cardiology

## 2017-02-12 VITALS — BP 124/70 | HR 83 | Ht 67.5 in | Wt 258.4 lb

## 2017-02-12 DIAGNOSIS — Z952 Presence of prosthetic heart valve: Secondary | ICD-10-CM

## 2017-02-12 DIAGNOSIS — Z48812 Encounter for surgical aftercare following surgery on the circulatory system: Secondary | ICD-10-CM | POA: Diagnosis not present

## 2017-02-12 DIAGNOSIS — Z951 Presence of aortocoronary bypass graft: Secondary | ICD-10-CM

## 2017-03-10 ENCOUNTER — Ambulatory Visit
Admission: RE | Admit: 2017-03-10 | Discharge: 2017-03-10 | Disposition: A | Discharge status: Home / Self Care | Payer: PPO | Source: Ambulatory Visit | Attending: Neurological Surgery | Admitting: Neurological Surgery

## 2017-03-10 ENCOUNTER — Other Ambulatory Visit: Payer: PPO

## 2017-03-10 DIAGNOSIS — G912 (Idiopathic) normal pressure hydrocephalus: Secondary | ICD-10-CM | POA: Diagnosis not present

## 2017-04-19 ENCOUNTER — Other Ambulatory Visit: Payer: Self-pay | Admitting: Physician Assistant

## 2017-04-21 DIAGNOSIS — L821 Other seborrheic keratosis: Secondary | ICD-10-CM | POA: Diagnosis not present

## 2017-04-21 DIAGNOSIS — L578 Other skin changes due to chronic exposure to nonionizing radiation: Secondary | ICD-10-CM | POA: Diagnosis not present

## 2017-04-21 DIAGNOSIS — L820 Inflamed seborrheic keratosis: Secondary | ICD-10-CM | POA: Diagnosis not present

## 2017-04-21 DIAGNOSIS — D225 Melanocytic nevi of trunk: Secondary | ICD-10-CM | POA: Diagnosis not present

## 2017-04-22 ENCOUNTER — Other Ambulatory Visit: Payer: Self-pay | Admitting: Interventional Cardiology

## 2017-04-22 MED ORDER — DILTIAZEM HCL ER COATED BEADS 240 MG PO CP24: 240.0000 mg | ORAL_CAPSULE | Freq: Every day | ORAL | 2 refills | Status: DC

## 2017-04-29 DIAGNOSIS — I10 Essential (primary) hypertension: Secondary | ICD-10-CM | POA: Diagnosis not present

## 2017-04-29 DIAGNOSIS — R7303 Prediabetes: Secondary | ICD-10-CM | POA: Diagnosis not present

## 2017-04-29 DIAGNOSIS — Z0000 Encounter for general adult medical examination without abnormal findings: Secondary | ICD-10-CM | POA: Diagnosis not present

## 2017-04-29 DIAGNOSIS — G912 (Idiopathic) normal pressure hydrocephalus: Secondary | ICD-10-CM | POA: Diagnosis not present

## 2017-04-29 DIAGNOSIS — I2510 Atherosclerotic heart disease of native coronary artery without angina pectoris: Secondary | ICD-10-CM | POA: Diagnosis not present

## 2017-04-29 DIAGNOSIS — Z1389 Encounter for screening for other disorder: Secondary | ICD-10-CM | POA: Diagnosis not present

## 2017-04-29 DIAGNOSIS — Z86010 Personal history of colonic polyps: Secondary | ICD-10-CM | POA: Diagnosis not present

## 2017-04-29 DIAGNOSIS — M1990 Unspecified osteoarthritis, unspecified site: Secondary | ICD-10-CM | POA: Diagnosis not present

## 2017-04-29 DIAGNOSIS — M8580 Other specified disorders of bone density and structure, unspecified site: Secondary | ICD-10-CM | POA: Diagnosis not present

## 2017-04-29 DIAGNOSIS — K219 Gastro-esophageal reflux disease without esophagitis: Secondary | ICD-10-CM | POA: Diagnosis not present

## 2017-04-29 DIAGNOSIS — N400 Enlarged prostate without lower urinary tract symptoms: Secondary | ICD-10-CM | POA: Diagnosis not present

## 2017-04-29 DIAGNOSIS — F325 Major depressive disorder, single episode, in full remission: Secondary | ICD-10-CM | POA: Diagnosis not present

## 2017-06-06 DIAGNOSIS — I10 Essential (primary) hypertension: Secondary | ICD-10-CM | POA: Diagnosis not present

## 2017-06-06 DIAGNOSIS — I4891 Unspecified atrial fibrillation: Secondary | ICD-10-CM | POA: Diagnosis not present

## 2017-06-06 DIAGNOSIS — M1990 Unspecified osteoarthritis, unspecified site: Secondary | ICD-10-CM | POA: Diagnosis not present

## 2017-06-06 DIAGNOSIS — I2510 Atherosclerotic heart disease of native coronary artery without angina pectoris: Secondary | ICD-10-CM | POA: Diagnosis not present

## 2017-06-06 DIAGNOSIS — N400 Enlarged prostate without lower urinary tract symptoms: Secondary | ICD-10-CM | POA: Diagnosis not present

## 2017-06-06 DIAGNOSIS — F325 Major depressive disorder, single episode, in full remission: Secondary | ICD-10-CM | POA: Diagnosis not present

## 2017-06-10 DIAGNOSIS — N3281 Overactive bladder: Secondary | ICD-10-CM | POA: Diagnosis not present

## 2017-06-12 DIAGNOSIS — H40013 Open angle with borderline findings, low risk, bilateral: Secondary | ICD-10-CM | POA: Diagnosis not present

## 2017-06-23 ENCOUNTER — Telehealth: Payer: Self-pay | Admitting: Interventional Cardiology

## 2017-06-23 NOTE — Telephone Encounter (Signed)
New message     Does the patient need pre-meds for colonoscopy, like he does for dental work?

## 2017-06-23 NOTE — Telephone Encounter (Signed)
Called and made patient aware that according to most recent guidelines that "antibiotic prophylaxis is not required for routine endoscopic procedures associated with a low risk of bacteremia". "Routine upper endoscopy, colonoscopy, and flexible sigmoidoscopy are all considered to be low-risk procedures with regard to bacteremia and infection". Made patient aware that he does not need antibiotics prior to his colonoscopy for SBE prophylaxis like he does prior to dental procedures. Patient verbalized understanding and thanked me for the call.

## 2017-07-17 DIAGNOSIS — N400 Enlarged prostate without lower urinary tract symptoms: Secondary | ICD-10-CM | POA: Diagnosis not present

## 2017-07-17 DIAGNOSIS — F325 Major depressive disorder, single episode, in full remission: Secondary | ICD-10-CM | POA: Diagnosis not present

## 2017-07-17 DIAGNOSIS — I2510 Atherosclerotic heart disease of native coronary artery without angina pectoris: Secondary | ICD-10-CM | POA: Diagnosis not present

## 2017-07-17 DIAGNOSIS — I4891 Unspecified atrial fibrillation: Secondary | ICD-10-CM | POA: Diagnosis not present

## 2017-07-17 DIAGNOSIS — I10 Essential (primary) hypertension: Secondary | ICD-10-CM | POA: Diagnosis not present

## 2017-07-17 DIAGNOSIS — M1990 Unspecified osteoarthritis, unspecified site: Secondary | ICD-10-CM | POA: Diagnosis not present

## 2017-07-27 DIAGNOSIS — M1990 Unspecified osteoarthritis, unspecified site: Secondary | ICD-10-CM | POA: Diagnosis not present

## 2017-07-27 DIAGNOSIS — I2510 Atherosclerotic heart disease of native coronary artery without angina pectoris: Secondary | ICD-10-CM | POA: Diagnosis not present

## 2017-07-27 DIAGNOSIS — N400 Enlarged prostate without lower urinary tract symptoms: Secondary | ICD-10-CM | POA: Diagnosis not present

## 2017-07-27 DIAGNOSIS — I10 Essential (primary) hypertension: Secondary | ICD-10-CM | POA: Diagnosis not present

## 2017-07-27 DIAGNOSIS — I4891 Unspecified atrial fibrillation: Secondary | ICD-10-CM | POA: Diagnosis not present

## 2017-07-27 DIAGNOSIS — F325 Major depressive disorder, single episode, in full remission: Secondary | ICD-10-CM | POA: Diagnosis not present

## 2017-07-28 DIAGNOSIS — D126 Benign neoplasm of colon, unspecified: Secondary | ICD-10-CM | POA: Diagnosis not present

## 2017-07-28 DIAGNOSIS — K635 Polyp of colon: Secondary | ICD-10-CM | POA: Diagnosis not present

## 2017-07-28 DIAGNOSIS — Z86010 Personal history of colonic polyps: Secondary | ICD-10-CM | POA: Diagnosis not present

## 2017-07-28 DIAGNOSIS — K5730 Diverticulosis of large intestine without perforation or abscess without bleeding: Secondary | ICD-10-CM | POA: Diagnosis not present

## 2017-07-28 DIAGNOSIS — K559 Vascular disorder of intestine, unspecified: Secondary | ICD-10-CM | POA: Diagnosis not present

## 2017-07-28 DIAGNOSIS — D124 Benign neoplasm of descending colon: Secondary | ICD-10-CM | POA: Diagnosis not present

## 2017-07-29 NOTE — Progress Notes (Signed)
Cardiology Office Note    Date:  07/30/2017   ID:  [name], DOB September 04, 1940, MRN [id number]  PCP:  Gaynelle Arabian, MD  Cardiologist: Larae Grooms, MD  Chief Complaint  Patient presents with  . Follow-up    History of Present Illness:  Joseph [name] is a 77 y.o. 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  Amiodarone stopped 11/2016 Coumadin stopped 01/2017 by Dr. Irish Lack as he was maintaining normal sinus rhythm and blood pressure was well controlled at that time.  SBE prophylaxis recommended.  Patient comes in today accompanied by his wife.  They live at [facility] in independent living.  He had been swimming regularly but has not in the past month.  His exercise is limited from chronic knee problems.  He denies chest pain, palpitations, dyspnea, dyspnea on exertion, dizziness or presyncope.      Past Medical History:  Diagnosis Date  . Aortic stenosis   . Arthritis   . BPH (benign prostatic hyperplasia)   . Depression   . Diverticulosis   . Dyspnea   . Family history of adverse reaction to anesthesia    Pts mother had a difficult time awakening after anesthesia  . GERD (gastroesophageal reflux disease)   . Heart murmur   . Hypertension   . 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 Pre-diabetes   . Rupture of left quadriceps tendon 07/21/2014  . Skin cancer, basal cell   . Urinary incontinence     Past Surgical History:  Procedure Laterality Date  . ANAL FISSURE REPAIR    . AORTIC VALVE REPLACEMENT N/A 09/20/2016   Procedure: AORTIC VALVE REPLACEMENT (AVR) USING MAGNA EASE 23 MM PERICARDIAL TISSUE HEART VALVE MODEL 3300TFX;  Surgeon: Gaye Pollack, MD;  Location: [facility];  Service: Open Heart Surgery;  Laterality: N/A;  . BACK SURGERY    . CARDIAC CATHETERIZATION    . COLONOSCOPY W/ POLYPECTOMY    . CORONARY ARTERY  BYPASS GRAFT N/A 09/20/2016   Procedure: CORONARY ARTERY BYPASS GRAFTING (CABG)x 3 WITH ENDOSCOPIC HARVESTING OF RIGHT SAPHENOUS VEIN;  Surgeon: Gaye Pollack, MD;  Location: [facility];  Service: Open Heart Surgery;  Laterality: N/A;  . ELBOW SURGERY Right   . FINGER SURGERY    . left shoulder RTC tear    . LEG SURGERY     tendon repair right leg  . MICRODISCECTOMY LUMBAR     l5-s1  . QUADRICEPS TENDON REPAIR Left 07/21/2014   Procedure: REPAIR LEFT QUADRICEP TENDON;  Surgeon: Marchia Bond, MD;  Location: [facility];  Service: Orthopedics;  Laterality: Left;  . RIGHT/LEFT HEART CATH AND CORONARY ANGIOGRAPHY N/A 09/03/2016   Procedure: RIGHT/LEFT HEART CATH AND CORONARY ANGIOGRAPHY;  Surgeon: Jettie Booze, MD;  Location: [facility];  Service: Cardiovascular;  Laterality: N/A;  . TEE WITHOUT CARDIOVERSION N/A 09/20/2016   Procedure: TRANSESOPHAGEAL ECHOCARDIOGRAM (TEE);  Surgeon: Gaye Pollack, MD;  Location: [facility];  Service: Open Heart Surgery;  Laterality: N/A;  . TONSILLECTOMY    . 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   Procedure: SHUNT INSERTION VENTRICULAR-PERITONEAL;  Surgeon: Eustace Moore, MD;  Location: [facility];  Service: Neurosurgery;  Laterality: Right;    Current Medications: Current Meds  Medication Sig  . acetaminophen (TYLENOL) 500 MG tablet Take 500-1,000 mg by mouth 4 (four) times daily as needed for  moderate pain.   Marland Kitchen amoxicillin (AMOXIL) 500 MG capsule Take 500 mg by mouth See admin instructions. Take 4 capsules 1 hour prior to dental procedures  . aspirin EC 81 MG tablet Take 81 mg by mouth daily.   Marland Kitchen atorvastatin (LIPITOR) 10 MG tablet Take 1 tablet (10 mg total) by mouth daily at 6 PM.  . B Complex Vitamins (B-COMPLEX HIGH POTENCY PO) Take 1 tablet by mouth daily.   Marland Kitchen diltiazem (CARDIZEM CD) 240 MG 24 hr capsule Take 1 capsule (240 mg total) by mouth daily.  Marland Kitchen docusate sodium (COLACE) 100 MG capsule Take 100 mg by mouth daily.     . lansoprazole (PREVACID) 30 MG capsule Take 30 mg by mouth 2 (two) times daily.   . mirabegron ER (MYRBETRIQ) 50 MG TB24 tablet Take 50 mg by mouth daily.   . nabumetone (RELAFEN) 750 MG tablet Take 1,500 mg by mouth daily.   . psyllium (METAMUCIL) 0.52 g capsule Take 1.56 g by mouth daily.     Allergies:   Ace inhibitors; Atenolol; Beta adrenergic blockers; Erythromycin; and Ciprofloxacin   Social History   Socioeconomic History  . Marital status: Married    Spouse name: Not on file  . Number of children: Not on file  . Years of education: Not on file  . Highest education level: Not on file  Occupational History  . Not on file  Social Needs  . Financial resource strain: Not on file  . Food insecurity:    Worry: Not on file    Inability: Not on file  . Transportation needs:    Medical: Not on file    Non-medical: Not on file  Tobacco Use  . Smoking status: Former Smoker    Types: Cigarettes, Cigars, Pipe  . Smokeless tobacco: Never Used  . Tobacco comment: Quit in the 80's  Substance and Sexual Activity  . Alcohol use: Yes    Alcohol/week: 1.2 - 1.8 oz    Types: 2 - 3 Glasses of wine per week    Comment: occasional 1-2 times week  . Drug use: No  . Sexual activity: Not Currently  Lifestyle  . Physical activity:    Days per week: Not on file    Minutes per session: Not on file  . Stress: Not on file  Relationships  . Social connections:    Talks on phone: Not on file    Gets together: Not on file    Attends religious service: Not on file    Active member of club or organization: Not on file    Attends meetings of clubs or organizations: Not on file    Relationship status: Not on file  Other Topics Concern  . Not on file  Social History Narrative  . Not on file     Family History:  The patient's   family history includes Heart attack in his maternal grandmother; Heart disease in his mother.   ROS:   Please see the history of present illness.    Review of  Systems  Constitution: Negative.  HENT: Negative.   Cardiovascular: Negative.   Respiratory: Negative.   Endocrine: Negative.   Hematologic/Lymphatic: Negative.   Musculoskeletal: Positive for joint pain.  Gastrointestinal: Negative.   Genitourinary: Negative.   Neurological: Negative.    All other systems reviewed and are negative.   PHYSICAL EXAM:   VS:  BP 122/84   Pulse 81   Ht 5' 9.5" (1.765 m)   Wt 257 lb (116.6  kg)   SpO2 94%   BMI 37.41 kg/m   Physical Exam  GEN: Obese, in no acute distress  Neck: no JVD, carotid bruits, or masses Cardiac:RRR; 2/6 systolic murmur at the left sternal border Respiratory:  clear to auscultation bilaterally, normal work of breathing GI: soft, nontender, nondistended, + BS Ext: without cyanosis, clubbing, or edema, Good distal pulses bilaterally Neuro:  Alert and Oriented x 3 Psych: euthymic mood, full affect  Wt Readings from Last 3 Encounters:  07/30/17 257 lb (116.6 kg)  02/12/17 258 lb 6.1 oz (117.2 kg)  01/28/17 258 lb 12.8 oz (117.4 kg)      Studies/Labs Reviewed:   EKG:  EKG is not ordered today.   Recent Labs: 09/21/2016: Magnesium 2.1 10/05/2016: Hemoglobin 13.0; Platelets 454 01/31/2017: ALT 27; BUN 16; Creatinine, Ser 0.97; Potassium 4.4; Sodium 142   Lipid Panel    Component Value Date/Time   CHOL 130 01/31/2017 0855   TRIG 110 01/31/2017 0855   HDL 62 01/31/2017 0855   CHOLHDL 2.1 01/31/2017 0855   LDLCALC 46 01/31/2017 0855    Additional studies/ records that were reviewed today include:  Intra-Op TEE 8/2018Conclusions   Result status: Final result   Left ventricle: Normal cavity size, wall thickness, left ventricular diastolic function and left atrial pressure. LV systolic function is mildly reduced with an EF of 45-50%. There are no obvious wall motion abnormalities.  Aortic valve: The valve is trileaflet. Severe valve thickening present. Severe valve calcification present. Severely decreased leaflet  separation. Severe stenosis. Mild to moderate regurgitation.  Mitral valve: Mild regurgitation.  Right ventricle: Normal cavity size, wall thickness and ejection fraction.  Tricuspid valve: Mild regurgitation.      2D echo 08/12/2016------------------------------------------------------------------- Study Conclusions   - Left ventricle: The cavity size was normal. Wall thickness was   normal. Systolic function was normal. The estimated ejection   fraction was in the range of 55% to 60%. Wall motion was normal;   there were no regional wall motion abnormalities. There was   fusion of early and atrial contributions to ventricular filling. - Aortic valve: Valve mobility was severely restricted. There was   severe stenosis. Mean gradient (S): 46 mm Hg. Peak gradient (S):   76 mm Hg. Valve area (VTI): 0.88 cm^2. Valve area (Vmax): 0.82   cm^2. Valve area (Vmean): 0.81 cm^2.  Cardiac catheterization 08/2016 ANGIOGRAPHY  Conclusion      Mid LAD lesion, 90 %stenosed. Calcified.  Mid RCA lesion, 100 %stenosed.  Prox Cx to Mid Cx lesion, 50 %stenosed.  Ramus lesion, 25 %stenosed.  CO 5.8 L/min. CI 2.4. Ao sat 96%. PA sat 71%.  Severe right subclavian tortuosity which precludes torquing catheters. Would not attempt right radial access given this tortuosity.   Will discuss with CT surgery regarding CABG/AVR.         ASSESSMENT:    1. Coronary artery disease involving native coronary artery of native heart without angina pectoris   2. S/P CABG x 3   3. S/P AVR   4. Essential hypertension   5. Paroxysmal atrial fibrillation (HCC)   6. Morbid obesity ([place])      PLAN:  In order of problems listed above:  CAD status post CABG x3 and pericardial AVR 08/2016 needs SBE prophylaxis.  Doing well without angina or cardiac complaints.  Follow-up with Dr. Irish Lack in January with labs.  Essential hypertension blood pressure well controlled with diltiazem  Postop atrial  fibrillation treated with amiodarone and Coumadin both have  been stopped and has maintained normal sinus rhythm  Morbid obesity regular exercise and weight loss program such as weight watchers recommended to the patient.  Hyperlipidemia LDL below goal on labs and January 2019.  Will need repeated in January.  Medication Adjustments/Labs and Tests Ordered: Current medicines are reviewed at length with the patient today.  Concerns regarding medicines are outlined above.  Medication changes, Labs and Tests ordered today are listed in the Patient Instructions below. Patient Instructions  Medication Instructions:  Your physician recommends that you continue on your current medications as directed. Please refer to the Current Medication list given to you today.   Labwork: None ordered  Testing/Procedures: None ordered  Follow-Up: Your physician wants you to follow-up with Dr. Irish Lack in January of 2020. You will receive a reminder letter in the mail two months in advance. If you don't receive a letter, please call our office to schedule the follow-up appointment.   Any Other Special Instructions Will Be Listed Below (If Applicable).  Your provider recommends that you exercise 30-40 minutes daily with a suggested weight loss program such as Weight Watchers.    If you need a refill on your cardiac medications before your next appointment, please call your pharmacy.        Sumner Boast, PA-C  07/30/2017 10:58 AM    [facility] [address], [place], [place]  [address] Phone: [phone]; Fax: [phone]

## 2017-07-30 ENCOUNTER — Encounter: Payer: Self-pay | Admitting: Physician Assistant

## 2017-07-30 ENCOUNTER — Ambulatory Visit: Payer: PPO | Admitting: Physician Assistant

## 2017-07-30 VITALS — BP 122/84 | HR 81 | Ht 69.5 in | Wt 257.0 lb

## 2017-07-30 DIAGNOSIS — Z952 Presence of prosthetic heart valve: Secondary | ICD-10-CM | POA: Diagnosis not present

## 2017-07-30 DIAGNOSIS — Z951 Presence of aortocoronary bypass graft: Secondary | ICD-10-CM | POA: Diagnosis not present

## 2017-07-30 DIAGNOSIS — I10 Essential (primary) hypertension: Secondary | ICD-10-CM

## 2017-07-30 DIAGNOSIS — I480 Paroxysmal atrial fibrillation: Secondary | ICD-10-CM | POA: Diagnosis not present

## 2017-07-30 DIAGNOSIS — I2510 Atherosclerotic heart disease of native coronary artery without angina pectoris: Secondary | ICD-10-CM | POA: Diagnosis not present

## 2017-07-30 NOTE — Patient Instructions (Signed)
Medication Instructions:  Your physician recommends that you continue on your current medications as directed. Please refer to the Current Medication list given to you today.   Labwork: None ordered  Testing/Procedures: None ordered  Follow-Up: Your physician wants you to follow-up with Dr. Irish Lack in January of 2020. You will receive a reminder letter in the mail two months in advance. If you don't receive a letter, please call our office to schedule the follow-up appointment.   Any Other Special Instructions Will Be Listed Below (If Applicable).  Your provider recommends that you exercise 30-40 minutes daily with a suggested weight loss program such as Weight Watchers.    If you need a refill on your cardiac medications before your next appointment, please call your pharmacy.

## 2017-07-31 DIAGNOSIS — K559 Vascular disorder of intestine, unspecified: Secondary | ICD-10-CM | POA: Diagnosis not present

## 2017-07-31 DIAGNOSIS — D126 Benign neoplasm of colon, unspecified: Secondary | ICD-10-CM | POA: Diagnosis not present

## 2017-07-31 DIAGNOSIS — K635 Polyp of colon: Secondary | ICD-10-CM | POA: Diagnosis not present

## 2017-09-01 DIAGNOSIS — I2510 Atherosclerotic heart disease of native coronary artery without angina pectoris: Secondary | ICD-10-CM | POA: Diagnosis not present

## 2017-09-01 DIAGNOSIS — M1990 Unspecified osteoarthritis, unspecified site: Secondary | ICD-10-CM | POA: Diagnosis not present

## 2017-09-01 DIAGNOSIS — N400 Enlarged prostate without lower urinary tract symptoms: Secondary | ICD-10-CM | POA: Diagnosis not present

## 2017-09-01 DIAGNOSIS — I10 Essential (primary) hypertension: Secondary | ICD-10-CM | POA: Diagnosis not present

## 2017-09-01 DIAGNOSIS — I4891 Unspecified atrial fibrillation: Secondary | ICD-10-CM | POA: Diagnosis not present

## 2017-09-01 DIAGNOSIS — F325 Major depressive disorder, single episode, in full remission: Secondary | ICD-10-CM | POA: Diagnosis not present

## 2017-09-23 DIAGNOSIS — Z6836 Body mass index (BMI) 36.0-36.9, adult: Secondary | ICD-10-CM | POA: Diagnosis not present

## 2017-09-23 DIAGNOSIS — G912 (Idiopathic) normal pressure hydrocephalus: Secondary | ICD-10-CM | POA: Diagnosis not present

## 2017-09-23 DIAGNOSIS — I10 Essential (primary) hypertension: Secondary | ICD-10-CM | POA: Diagnosis not present

## 2017-09-26 DIAGNOSIS — N400 Enlarged prostate without lower urinary tract symptoms: Secondary | ICD-10-CM | POA: Diagnosis not present

## 2017-09-26 DIAGNOSIS — I2510 Atherosclerotic heart disease of native coronary artery without angina pectoris: Secondary | ICD-10-CM | POA: Diagnosis not present

## 2017-09-26 DIAGNOSIS — F325 Major depressive disorder, single episode, in full remission: Secondary | ICD-10-CM | POA: Diagnosis not present

## 2017-09-26 DIAGNOSIS — I4891 Unspecified atrial fibrillation: Secondary | ICD-10-CM | POA: Diagnosis not present

## 2017-09-26 DIAGNOSIS — I10 Essential (primary) hypertension: Secondary | ICD-10-CM | POA: Diagnosis not present

## 2017-09-26 DIAGNOSIS — M1990 Unspecified osteoarthritis, unspecified site: Secondary | ICD-10-CM | POA: Diagnosis not present

## 2017-10-08 DIAGNOSIS — Z23 Encounter for immunization: Secondary | ICD-10-CM | POA: Diagnosis not present

## 2017-10-21 DIAGNOSIS — G912 (Idiopathic) normal pressure hydrocephalus: Secondary | ICD-10-CM | POA: Diagnosis not present

## 2017-10-30 DIAGNOSIS — M1990 Unspecified osteoarthritis, unspecified site: Secondary | ICD-10-CM | POA: Diagnosis not present

## 2017-10-30 DIAGNOSIS — I2510 Atherosclerotic heart disease of native coronary artery without angina pectoris: Secondary | ICD-10-CM | POA: Diagnosis not present

## 2017-10-30 DIAGNOSIS — G912 (Idiopathic) normal pressure hydrocephalus: Secondary | ICD-10-CM | POA: Diagnosis not present

## 2017-10-30 DIAGNOSIS — Z8679 Personal history of other diseases of the circulatory system: Secondary | ICD-10-CM | POA: Diagnosis not present

## 2017-10-30 DIAGNOSIS — F325 Major depressive disorder, single episode, in full remission: Secondary | ICD-10-CM | POA: Diagnosis not present

## 2017-10-30 DIAGNOSIS — Z952 Presence of prosthetic heart valve: Secondary | ICD-10-CM | POA: Diagnosis not present

## 2017-10-30 DIAGNOSIS — R7303 Prediabetes: Secondary | ICD-10-CM | POA: Diagnosis not present

## 2017-10-30 DIAGNOSIS — N400 Enlarged prostate without lower urinary tract symptoms: Secondary | ICD-10-CM | POA: Diagnosis not present

## 2017-10-30 DIAGNOSIS — M8580 Other specified disorders of bone density and structure, unspecified site: Secondary | ICD-10-CM | POA: Diagnosis not present

## 2017-10-30 DIAGNOSIS — I10 Essential (primary) hypertension: Secondary | ICD-10-CM | POA: Diagnosis not present

## 2017-10-30 DIAGNOSIS — K219 Gastro-esophageal reflux disease without esophagitis: Secondary | ICD-10-CM | POA: Diagnosis not present

## 2017-10-30 DIAGNOSIS — Z86010 Personal history of colonic polyps: Secondary | ICD-10-CM | POA: Diagnosis not present

## 2017-12-01 DIAGNOSIS — G912 (Idiopathic) normal pressure hydrocephalus: Secondary | ICD-10-CM | POA: Diagnosis not present

## 2017-12-08 DIAGNOSIS — R3915 Urgency of urination: Secondary | ICD-10-CM | POA: Diagnosis not present

## 2017-12-08 DIAGNOSIS — N3281 Overactive bladder: Secondary | ICD-10-CM | POA: Diagnosis not present

## 2017-12-15 DIAGNOSIS — H2511 Age-related nuclear cataract, right eye: Secondary | ICD-10-CM | POA: Diagnosis not present

## 2017-12-15 DIAGNOSIS — Z961 Presence of intraocular lens: Secondary | ICD-10-CM | POA: Diagnosis not present

## 2017-12-15 DIAGNOSIS — H40013 Open angle with borderline findings, low risk, bilateral: Secondary | ICD-10-CM | POA: Diagnosis not present

## 2017-12-15 DIAGNOSIS — H25011 Cortical age-related cataract, right eye: Secondary | ICD-10-CM | POA: Diagnosis not present

## 2017-12-17 DIAGNOSIS — H35033 Hypertensive retinopathy, bilateral: Secondary | ICD-10-CM | POA: Diagnosis not present

## 2017-12-17 DIAGNOSIS — N400 Enlarged prostate without lower urinary tract symptoms: Secondary | ICD-10-CM | POA: Diagnosis not present

## 2017-12-17 DIAGNOSIS — I2510 Atherosclerotic heart disease of native coronary artery without angina pectoris: Secondary | ICD-10-CM | POA: Diagnosis not present

## 2017-12-17 DIAGNOSIS — I10 Essential (primary) hypertension: Secondary | ICD-10-CM | POA: Diagnosis not present

## 2017-12-17 DIAGNOSIS — M1990 Unspecified osteoarthritis, unspecified site: Secondary | ICD-10-CM | POA: Diagnosis not present

## 2017-12-17 DIAGNOSIS — F325 Major depressive disorder, single episode, in full remission: Secondary | ICD-10-CM | POA: Diagnosis not present

## 2017-12-17 DIAGNOSIS — I4891 Unspecified atrial fibrillation: Secondary | ICD-10-CM | POA: Diagnosis not present

## 2018-01-19 ENCOUNTER — Telehealth: Payer: Self-pay | Admitting: Interventional Cardiology

## 2018-01-19 ENCOUNTER — Other Ambulatory Visit: Payer: Self-pay

## 2018-01-19 DIAGNOSIS — I2510 Atherosclerotic heart disease of native coronary artery without angina pectoris: Secondary | ICD-10-CM | POA: Diagnosis not present

## 2018-01-19 DIAGNOSIS — I4891 Unspecified atrial fibrillation: Secondary | ICD-10-CM | POA: Diagnosis not present

## 2018-01-19 DIAGNOSIS — M1990 Unspecified osteoarthritis, unspecified site: Secondary | ICD-10-CM | POA: Diagnosis not present

## 2018-01-19 DIAGNOSIS — H35033 Hypertensive retinopathy, bilateral: Secondary | ICD-10-CM | POA: Diagnosis not present

## 2018-01-19 DIAGNOSIS — N400 Enlarged prostate without lower urinary tract symptoms: Secondary | ICD-10-CM | POA: Diagnosis not present

## 2018-01-19 DIAGNOSIS — I10 Essential (primary) hypertension: Secondary | ICD-10-CM | POA: Diagnosis not present

## 2018-01-19 DIAGNOSIS — F325 Major depressive disorder, single episode, in full remission: Secondary | ICD-10-CM | POA: Diagnosis not present

## 2018-01-19 MED ORDER — ATORVASTATIN CALCIUM 10 MG PO TABS: 10.0000 mg | ORAL_TABLET | Freq: Every day | ORAL | 1 refills | Status: DC

## 2018-01-19 NOTE — Telephone Encounter (Signed)
°*  STAT* If patient is at the pharmacy, call can be transferred to refill team.   1. Which medications need to be refilled? (please list name of each medication and dose if known)  New prescription for Atorvastatin- switching pharmacy  2. Which pharmacy/location (including street and city if local pharmacy) is medication to be sent to? [facility] [phone]  3. Do they need a 30 day or 90 day supply? 90 and refills

## 2018-02-18 DIAGNOSIS — H35033 Hypertensive retinopathy, bilateral: Secondary | ICD-10-CM | POA: Diagnosis not present

## 2018-02-18 DIAGNOSIS — N400 Enlarged prostate without lower urinary tract symptoms: Secondary | ICD-10-CM | POA: Diagnosis not present

## 2018-02-18 DIAGNOSIS — I2510 Atherosclerotic heart disease of native coronary artery without angina pectoris: Secondary | ICD-10-CM | POA: Diagnosis not present

## 2018-02-18 DIAGNOSIS — I4891 Unspecified atrial fibrillation: Secondary | ICD-10-CM | POA: Diagnosis not present

## 2018-02-18 DIAGNOSIS — I10 Essential (primary) hypertension: Secondary | ICD-10-CM | POA: Diagnosis not present

## 2018-02-18 DIAGNOSIS — M1990 Unspecified osteoarthritis, unspecified site: Secondary | ICD-10-CM | POA: Diagnosis not present

## 2018-02-18 DIAGNOSIS — F325 Major depressive disorder, single episode, in full remission: Secondary | ICD-10-CM | POA: Diagnosis not present

## 2018-03-17 DIAGNOSIS — I4891 Unspecified atrial fibrillation: Secondary | ICD-10-CM | POA: Diagnosis not present

## 2018-03-17 DIAGNOSIS — I10 Essential (primary) hypertension: Secondary | ICD-10-CM | POA: Diagnosis not present

## 2018-03-17 DIAGNOSIS — H35033 Hypertensive retinopathy, bilateral: Secondary | ICD-10-CM | POA: Diagnosis not present

## 2018-03-17 DIAGNOSIS — M1990 Unspecified osteoarthritis, unspecified site: Secondary | ICD-10-CM | POA: Diagnosis not present

## 2018-03-17 DIAGNOSIS — I2510 Atherosclerotic heart disease of native coronary artery without angina pectoris: Secondary | ICD-10-CM | POA: Diagnosis not present

## 2018-03-17 DIAGNOSIS — F325 Major depressive disorder, single episode, in full remission: Secondary | ICD-10-CM | POA: Diagnosis not present

## 2018-03-17 DIAGNOSIS — N400 Enlarged prostate without lower urinary tract symptoms: Secondary | ICD-10-CM | POA: Diagnosis not present

## 2018-04-14 DIAGNOSIS — H35033 Hypertensive retinopathy, bilateral: Secondary | ICD-10-CM | POA: Diagnosis not present

## 2018-04-14 DIAGNOSIS — I4891 Unspecified atrial fibrillation: Secondary | ICD-10-CM | POA: Diagnosis not present

## 2018-04-14 DIAGNOSIS — I2510 Atherosclerotic heart disease of native coronary artery without angina pectoris: Secondary | ICD-10-CM | POA: Diagnosis not present

## 2018-04-14 DIAGNOSIS — N400 Enlarged prostate without lower urinary tract symptoms: Secondary | ICD-10-CM | POA: Diagnosis not present

## 2018-04-14 DIAGNOSIS — F325 Major depressive disorder, single episode, in full remission: Secondary | ICD-10-CM | POA: Diagnosis not present

## 2018-04-14 DIAGNOSIS — I10 Essential (primary) hypertension: Secondary | ICD-10-CM | POA: Diagnosis not present

## 2018-04-14 DIAGNOSIS — M1990 Unspecified osteoarthritis, unspecified site: Secondary | ICD-10-CM | POA: Diagnosis not present

## 2018-06-12 DIAGNOSIS — F325 Major depressive disorder, single episode, in full remission: Secondary | ICD-10-CM | POA: Diagnosis not present

## 2018-06-12 DIAGNOSIS — I10 Essential (primary) hypertension: Secondary | ICD-10-CM | POA: Diagnosis not present

## 2018-06-12 DIAGNOSIS — H35033 Hypertensive retinopathy, bilateral: Secondary | ICD-10-CM | POA: Diagnosis not present

## 2018-06-12 DIAGNOSIS — K219 Gastro-esophageal reflux disease without esophagitis: Secondary | ICD-10-CM | POA: Diagnosis not present

## 2018-06-12 DIAGNOSIS — Z0000 Encounter for general adult medical examination without abnormal findings: Secondary | ICD-10-CM | POA: Diagnosis not present

## 2018-06-12 DIAGNOSIS — G912 (Idiopathic) normal pressure hydrocephalus: Secondary | ICD-10-CM | POA: Diagnosis not present

## 2018-06-12 DIAGNOSIS — M8580 Other specified disorders of bone density and structure, unspecified site: Secondary | ICD-10-CM | POA: Diagnosis not present

## 2018-06-12 DIAGNOSIS — I4891 Unspecified atrial fibrillation: Secondary | ICD-10-CM | POA: Diagnosis not present

## 2018-06-12 DIAGNOSIS — R7303 Prediabetes: Secondary | ICD-10-CM | POA: Diagnosis not present

## 2018-06-12 DIAGNOSIS — M1990 Unspecified osteoarthritis, unspecified site: Secondary | ICD-10-CM | POA: Diagnosis not present

## 2018-06-12 DIAGNOSIS — I2510 Atherosclerotic heart disease of native coronary artery without angina pectoris: Secondary | ICD-10-CM | POA: Diagnosis not present

## 2018-06-12 DIAGNOSIS — Z952 Presence of prosthetic heart valve: Secondary | ICD-10-CM | POA: Diagnosis not present

## 2018-06-16 ENCOUNTER — Other Ambulatory Visit: Payer: Self-pay | Admitting: Interventional Cardiology

## 2018-06-17 DIAGNOSIS — I4891 Unspecified atrial fibrillation: Secondary | ICD-10-CM | POA: Diagnosis not present

## 2018-06-17 DIAGNOSIS — I2510 Atherosclerotic heart disease of native coronary artery without angina pectoris: Secondary | ICD-10-CM | POA: Diagnosis not present

## 2018-06-17 DIAGNOSIS — I10 Essential (primary) hypertension: Secondary | ICD-10-CM | POA: Diagnosis not present

## 2018-06-17 DIAGNOSIS — N400 Enlarged prostate without lower urinary tract symptoms: Secondary | ICD-10-CM | POA: Diagnosis not present

## 2018-06-17 DIAGNOSIS — F325 Major depressive disorder, single episode, in full remission: Secondary | ICD-10-CM | POA: Diagnosis not present

## 2018-06-17 DIAGNOSIS — H35033 Hypertensive retinopathy, bilateral: Secondary | ICD-10-CM | POA: Diagnosis not present

## 2018-06-17 DIAGNOSIS — H40013 Open angle with borderline findings, low risk, bilateral: Secondary | ICD-10-CM | POA: Diagnosis not present

## 2018-06-17 DIAGNOSIS — M1990 Unspecified osteoarthritis, unspecified site: Secondary | ICD-10-CM | POA: Diagnosis not present

## 2018-09-08 DIAGNOSIS — L820 Inflamed seborrheic keratosis: Secondary | ICD-10-CM | POA: Diagnosis not present

## 2018-09-08 DIAGNOSIS — L219 Seborrheic dermatitis, unspecified: Secondary | ICD-10-CM | POA: Diagnosis not present

## 2018-09-08 DIAGNOSIS — L821 Other seborrheic keratosis: Secondary | ICD-10-CM | POA: Diagnosis not present

## 2018-09-08 DIAGNOSIS — D229 Melanocytic nevi, unspecified: Secondary | ICD-10-CM | POA: Diagnosis not present

## 2018-09-08 DIAGNOSIS — L853 Xerosis cutis: Secondary | ICD-10-CM | POA: Diagnosis not present

## 2018-09-08 DIAGNOSIS — L570 Actinic keratosis: Secondary | ICD-10-CM | POA: Diagnosis not present

## 2018-09-08 DIAGNOSIS — Z85828 Personal history of other malignant neoplasm of skin: Secondary | ICD-10-CM | POA: Diagnosis not present

## 2018-09-08 DIAGNOSIS — L910 Hypertrophic scar: Secondary | ICD-10-CM | POA: Diagnosis not present

## 2018-09-13 DIAGNOSIS — N400 Enlarged prostate without lower urinary tract symptoms: Secondary | ICD-10-CM | POA: Diagnosis not present

## 2018-09-13 DIAGNOSIS — I4891 Unspecified atrial fibrillation: Secondary | ICD-10-CM | POA: Diagnosis not present

## 2018-09-13 DIAGNOSIS — M1990 Unspecified osteoarthritis, unspecified site: Secondary | ICD-10-CM | POA: Diagnosis not present

## 2018-09-13 DIAGNOSIS — I2510 Atherosclerotic heart disease of native coronary artery without angina pectoris: Secondary | ICD-10-CM | POA: Diagnosis not present

## 2018-09-13 DIAGNOSIS — I10 Essential (primary) hypertension: Secondary | ICD-10-CM | POA: Diagnosis not present

## 2018-09-13 DIAGNOSIS — H35033 Hypertensive retinopathy, bilateral: Secondary | ICD-10-CM | POA: Diagnosis not present

## 2018-09-13 DIAGNOSIS — F325 Major depressive disorder, single episode, in full remission: Secondary | ICD-10-CM | POA: Diagnosis not present

## 2018-09-16 ENCOUNTER — Other Ambulatory Visit: Payer: Self-pay | Admitting: Interventional Cardiology

## 2018-09-23 DIAGNOSIS — M1712 Unilateral primary osteoarthritis, left knee: Secondary | ICD-10-CM | POA: Diagnosis not present

## 2018-10-09 DIAGNOSIS — M1712 Unilateral primary osteoarthritis, left knee: Secondary | ICD-10-CM | POA: Diagnosis not present

## 2018-10-13 DIAGNOSIS — L570 Actinic keratosis: Secondary | ICD-10-CM | POA: Diagnosis not present

## 2018-10-13 DIAGNOSIS — L910 Hypertrophic scar: Secondary | ICD-10-CM | POA: Diagnosis not present

## 2018-10-13 DIAGNOSIS — L821 Other seborrheic keratosis: Secondary | ICD-10-CM | POA: Diagnosis not present

## 2018-10-13 DIAGNOSIS — D229 Melanocytic nevi, unspecified: Secondary | ICD-10-CM | POA: Diagnosis not present

## 2018-10-13 DIAGNOSIS — L820 Inflamed seborrheic keratosis: Secondary | ICD-10-CM | POA: Diagnosis not present

## 2018-10-13 DIAGNOSIS — L219 Seborrheic dermatitis, unspecified: Secondary | ICD-10-CM | POA: Diagnosis not present

## 2018-10-13 DIAGNOSIS — L853 Xerosis cutis: Secondary | ICD-10-CM | POA: Diagnosis not present

## 2018-10-13 DIAGNOSIS — Z85828 Personal history of other malignant neoplasm of skin: Secondary | ICD-10-CM | POA: Diagnosis not present

## 2018-12-14 DIAGNOSIS — N400 Enlarged prostate without lower urinary tract symptoms: Secondary | ICD-10-CM | POA: Diagnosis not present

## 2018-12-14 DIAGNOSIS — Z952 Presence of prosthetic heart valve: Secondary | ICD-10-CM | POA: Diagnosis not present

## 2018-12-14 DIAGNOSIS — K219 Gastro-esophageal reflux disease without esophagitis: Secondary | ICD-10-CM | POA: Diagnosis not present

## 2018-12-14 DIAGNOSIS — H35033 Hypertensive retinopathy, bilateral: Secondary | ICD-10-CM | POA: Diagnosis not present

## 2018-12-14 DIAGNOSIS — R7303 Prediabetes: Secondary | ICD-10-CM | POA: Diagnosis not present

## 2018-12-14 DIAGNOSIS — D692 Other nonthrombocytopenic purpura: Secondary | ICD-10-CM | POA: Diagnosis not present

## 2018-12-14 DIAGNOSIS — M1990 Unspecified osteoarthritis, unspecified site: Secondary | ICD-10-CM | POA: Diagnosis not present

## 2018-12-14 DIAGNOSIS — I10 Essential (primary) hypertension: Secondary | ICD-10-CM | POA: Diagnosis not present

## 2018-12-14 DIAGNOSIS — Z8679 Personal history of other diseases of the circulatory system: Secondary | ICD-10-CM | POA: Diagnosis not present

## 2018-12-14 DIAGNOSIS — I2510 Atherosclerotic heart disease of native coronary artery without angina pectoris: Secondary | ICD-10-CM | POA: Diagnosis not present

## 2018-12-14 DIAGNOSIS — Z86010 Personal history of colonic polyps: Secondary | ICD-10-CM | POA: Diagnosis not present

## 2018-12-20 ENCOUNTER — Other Ambulatory Visit: Payer: Self-pay | Admitting: Interventional Cardiology

## 2018-12-21 ENCOUNTER — Other Ambulatory Visit: Payer: Self-pay | Admitting: Interventional Cardiology

## 2018-12-21 DIAGNOSIS — M1990 Unspecified osteoarthritis, unspecified site: Secondary | ICD-10-CM | POA: Diagnosis not present

## 2018-12-21 DIAGNOSIS — F325 Major depressive disorder, single episode, in full remission: Secondary | ICD-10-CM | POA: Diagnosis not present

## 2018-12-21 DIAGNOSIS — H35033 Hypertensive retinopathy, bilateral: Secondary | ICD-10-CM | POA: Diagnosis not present

## 2018-12-21 DIAGNOSIS — N400 Enlarged prostate without lower urinary tract symptoms: Secondary | ICD-10-CM | POA: Diagnosis not present

## 2018-12-21 DIAGNOSIS — I10 Essential (primary) hypertension: Secondary | ICD-10-CM | POA: Diagnosis not present

## 2018-12-21 DIAGNOSIS — I2510 Atherosclerotic heart disease of native coronary artery without angina pectoris: Secondary | ICD-10-CM | POA: Diagnosis not present

## 2018-12-21 MED ORDER — ATORVASTATIN CALCIUM 10 MG PO TABS: 10.0000 mg | ORAL_TABLET | Freq: Every day | ORAL | 0 refills | Status: DC

## 2018-12-21 NOTE — Telephone Encounter (Signed)
New Message   *STAT* If patient is at the pharmacy, call can be transferred to refill team.   1. Which medications need to be refilled? (please list name of each medication and dose if known)atorvastatin (LIPITOR) 10 MG tablet   2. Which pharmacy/location (including street and city if local pharmacy) is medication to be sent to? [facility] - [place], [place] - [place] [address]  3. Do they need a 30 day or 90 day supply? 90 day supply

## 2018-12-22 ENCOUNTER — Other Ambulatory Visit: Payer: Self-pay

## 2018-12-22 ENCOUNTER — Encounter: Payer: Self-pay | Admitting: Physician Assistant

## 2018-12-22 ENCOUNTER — Telehealth: Payer: Self-pay | Admitting: *Deleted

## 2018-12-22 ENCOUNTER — Telehealth (INDEPENDENT_AMBULATORY_CARE_PROVIDER_SITE_OTHER): Payer: PPO | Admitting: Physician Assistant

## 2018-12-22 VITALS — BP 165/90 | HR 64 | Ht 70.0 in | Wt 265.0 lb

## 2018-12-22 DIAGNOSIS — Z952 Presence of prosthetic heart valve: Secondary | ICD-10-CM | POA: Diagnosis not present

## 2018-12-22 DIAGNOSIS — I4891 Unspecified atrial fibrillation: Secondary | ICD-10-CM

## 2018-12-22 DIAGNOSIS — Z951 Presence of aortocoronary bypass graft: Secondary | ICD-10-CM | POA: Diagnosis not present

## 2018-12-22 DIAGNOSIS — H25011 Cortical age-related cataract, right eye: Secondary | ICD-10-CM | POA: Diagnosis not present

## 2018-12-22 DIAGNOSIS — E782 Mixed hyperlipidemia: Secondary | ICD-10-CM | POA: Diagnosis not present

## 2018-12-22 DIAGNOSIS — I10 Essential (primary) hypertension: Secondary | ICD-10-CM

## 2018-12-22 DIAGNOSIS — H2511 Age-related nuclear cataract, right eye: Secondary | ICD-10-CM | POA: Diagnosis not present

## 2018-12-22 DIAGNOSIS — R7309 Other abnormal glucose: Secondary | ICD-10-CM | POA: Diagnosis not present

## 2018-12-22 DIAGNOSIS — H40013 Open angle with borderline findings, low risk, bilateral: Secondary | ICD-10-CM | POA: Diagnosis not present

## 2018-12-22 MED ORDER — ATORVASTATIN CALCIUM 10 MG PO TABS: 10.0000 mg | ORAL_TABLET | Freq: Every day | ORAL | 3 refills | Status: DC

## 2018-12-22 MED ORDER — DILTIAZEM HCL ER COATED BEADS 240 MG PO CP24: 240.0000 mg | ORAL_CAPSULE | Freq: Every day | ORAL | 3 refills | Status: DC

## 2018-12-22 NOTE — Telephone Encounter (Signed)

## 2018-12-22 NOTE — Patient Instructions (Signed)
Medication Instructions:  Your physician recommends that you continue on your current medications as directed. Please refer to the Current Medication list given to you today.  *If you need a refill on your cardiac medications before your next appointment, please call your pharmacy*  Lab Work: None ordered  If you have labs (blood work) drawn today and your tests are completely normal, you will receive your results only by: Marland Kitchen MyChart Message (if you have MyChart) OR . A paper copy in the mail If you have any lab test that is abnormal or we need to change your treatment, we will call you to review the results.  Testing/Procedures: None ordered  Follow-Up: At [facility], you and your health needs are our priority.  As part of our continuing mission to provide you with exceptional heart care, we have created designated Provider Care Teams.  These Care Teams include your primary Cardiologist (physician) and Advanced Practice Providers (APPs -  Physician Assistants and Nurse Practitioners) who all work together to provide you with the care you need, when you need it.  Your next appointment:   12 month(s)  The format for your next appointment:   In Person  Provider:   You may see Larae Grooms, MD or one of the following Advanced Practice Providers on your designated Care Team:    Melina Copa, PA-C  Ermalinda Barrios, PA-C   Other Instructions 1. Your physician has requested that you monitor and record your blood pressure readings at home daily 2 hours after you take your medicines. Please use the same machine at the same time of day to check your readings and record them. Call us in 2 weeks to report readings.  2. Your physician encouraged you to lose weight for better health.  Two Gram Sodium Diet 2000 mg  What is Sodium? Sodium is a mineral found naturally in many foods. The most significant source of sodium in the diet is table salt, which is about 40% sodium.  Processed,  convenience, and preserved foods also contain a large amount of sodium.  The body needs only 500 mg of sodium daily to function,  A normal diet provides more than enough sodium even if you do not use salt.  Why Limit Sodium? A build up of sodium in the body can cause thirst, increased blood pressure, shortness of breath, and water retention.  Decreasing sodium in the diet can reduce edema and risk of heart attack or stroke associated with high blood pressure.  Keep in mind that there are many other factors involved in these health problems.  Heredity, obesity, lack of exercise, cigarette smoking, stress and what you eat all play a role.  General Guidelines:  Do not add salt at the table or in cooking.  One teaspoon of salt contains over 2 grams of sodium.  Read food labels  Avoid processed and convenience foods  Ask your dietitian before eating any foods not dicussed in the menu planning guidelines  Consult your physician if you wish to use a salt substitute or a sodium containing medication such as antacids.  Limit milk and milk products to 16 oz (2 cups) per day.  Shopping Hints:  READ LABELS!! "Dietetic" does not necessarily mean low sodium.  Salt and other sodium ingredients are often added to foods during processing.   Menu Planning Guidelines Food Group Choose More Often Avoid  Beverages (see also the milk group All fruit juices, low-sodium, salt-free vegetables juices, low-sodium carbonated beverages Regular vegetable or tomato  juices, commercially softened water used for drinking or cooking  Breads and Cereals Enriched white, wheat, rye and pumpernickel bread, hard rolls and dinner rolls; muffins, cornbread and waffles; most dry cereals, cooked cereal without added salt; unsalted crackers and breadsticks; low sodium or homemade bread crumbs Bread, rolls and crackers with salted tops; quick breads; instant hot cereals; pancakes; commercial bread stuffing; self-rising flower and  biscuit mixes; regular bread crumbs or cracker crumbs  Desserts and Sweets Desserts and sweets mad with mild should be within allowance Instant pudding mixes and cake mixes  Fats Butter or margarine; vegetable oils; unsalted salad dressings, regular salad dressings limited to 1 Tbs; light, sour and heavy cream Regular salad dressings containing bacon fat, bacon bits, and salt pork; snack dips made with instant soup mixes or processed cheese; salted nuts  Fruits Most fresh, frozen and canned fruits Fruits processed with salt or sodium-containing ingredient (some dried fruits are processed with sodium sulfites        Vegetables Fresh, frozen vegetables and low- sodium canned vegetables Regular canned vegetables, sauerkraut, pickled vegetables, and others prepared in brine; frozen vegetables in sauces; vegetables seasoned with ham, bacon or salt pork  Condiments, Sauces, Miscellaneous  Salt substitute with physician's approval; pepper, herbs, spices; vinegar, lemon or lime juice; hot pepper sauce; garlic powder, onion powder, low sodium soy sauce (1 Tbs.); low sodium condiments (ketchup, chili sauce, mustard) in limited amounts (1 tsp.) fresh ground horseradish; unsalted tortilla chips, pretzels, potato chips, popcorn, salsa (1/4 cup) Any seasoning made with salt including garlic salt, celery salt, onion salt, and seasoned salt; sea salt, rock salt, kosher salt; meat tenderizers; monosodium glutamate; mustard, regular soy sauce, barbecue, sauce, chili sauce, teriyaki sauce, steak sauce, Worcestershire sauce, and most flavored vinegars; canned gravy and mixes; regular condiments; salted snack foods, olives, picles, relish, horseradish sauce, catsup   Food preparation: Try these seasonings Meats:    Pork Sage, onion Serve with applesauce  Chicken Poultry seasoning, thyme, parsley Serve with cranberry sauce  Lamb Curry powder, rosemary, garlic, thyme Serve with mint sauce or jelly  Veal Marjoram, basil  Serve with current jelly, cranberry sauce  Beef Pepper, bay leaf Serve with dry mustard, unsalted chive butter  Fish [place] leaf, dill Serve with unsalted lemon butter, unsalted parsley butter  Vegetables:    Asparagus Lemon juice   Broccoli Lemon juice   Carrots Mustard dressing parsley, mint, nutmeg, glazed with unsalted butter and sugar   Green beans Marjoram, lemon juice, nutmeg,dill seed   Tomatoes Basil, marjoram, onion   Spice /blend for [employer]" 4 tsp ground thyme 1 tsp ground sage 3 tsp ground rosemary 4 tsp ground marjoram   Test your knowledge 1. A product that says "Salt Free" may still contain sodium. True or False 2. Garlic Powder and Hot Pepper Sauce an be used as alternative seasonings.True or False 3. Processed foods have more sodium than fresh foods.  True or False 4. Canned Vegetables have less sodium than froze True or False  WAYS TO DECREASE YOUR SODIUM INTAKE 1. Avoid the use of added salt in cooking and at the table.  Table salt (and other prepared seasonings which contain salt) is probably one of the greatest sources of sodium in the diet.  Unsalted foods can gain flavor from the sweet, sour, and butter taste sensations of herbs and spices.  Instead of using salt for seasoning, try the following seasonings with the foods listed.  Remember: how you use them to enhance natural food  flavors is limited only by your creativity... Allspice-Meat, fish, eggs, fruit, peas, red and yellow vegetables Almond Extract-Fruit baked goods Anise Seed-Sweet breads, fruit, carrots, beets, cottage cheese, cookies (tastes like licorice) Basil-Meat, fish, eggs, vegetables, rice, vegetables salads, soups, sauces Bay Leaf-Meat, fish, stews, poultry Burnet-Salad, vegetables (cucumber-like flavor) Caraway Seed-Bread, cookies, cottage cheese, meat, vegetables, cheese, rice Cardamon-Baked goods, fruit, soups Celery Powder or seed-Salads, salad dressings, sauces, meatloaf, soup, bread.Do not  use  celery salt Chervil-Meats, salads, fish, eggs, vegetables, cottage cheese (parsley-like flavor) Chili Power-Meatloaf, chicken cheese, corn, eggplant, egg dishes Chives-Salads cottage cheese, egg dishes, soups, vegetables, sauces Cilantro-Salsa, casseroles Cinnamon-Baked goods, fruit, pork, lamb, chicken, carrots Cloves-Fruit, baked goods, fish, pot roast, green beans, beets, carrots Coriander-Pastry, cookies, meat, salads, cheese (lemon-orange flavor) Cumin-Meatloaf, fish,cheese, eggs, cabbage,fruit pie (caraway flavor) [employer], fruit, eggs, fish, poultry, cottage cheese, vegetables Dill Seed-Meat, cottage cheese, poultry, vegetables, fish, salads, bread Fennel Seed-Bread, cookies, apples, pork, eggs, fish, beets, cabbage, cheese, Licorice-like flavor Garlic-(buds or powder) Salads, meat, poultry, fish, bread, butter, vegetables, potatoes.Do not  use garlic salt Ginger-Fruit, vegetables, baked goods, meat, fish, poultry Horseradish Root-Meet, vegetables, butter Lemon Juice or Extract-Vegetables, fruit, tea, baked goods, fish salads Mace-Baked goods fruit, vegetables, fish, poultry (taste like nutmeg) Maple Extract-Syrups Marjoram-Meat, chicken, fish, vegetables, breads, green salads (taste like Sage) Mint-Tea, lamb, sherbet, vegetables, desserts, carrots, cabbage Mustard, Dry or Seed-Cheese, eggs, meats, vegetables, poultry Nutmeg-Baked goods, fruit, chicken, eggs, vegetables, desserts Onion Powder-Meat, fish, poultry, vegetables, cheese, eggs, bread, rice salads (Do not use   Onion salt) Orange Extract-Desserts, baked goods Oregano-Pasta, eggs, cheese, onions, pork, lamb, fish, chicken, vegetables, green salads Paprika-Meat, fish, poultry, eggs, cheese, vegetables Parsley Flakes-Butter, vegetables, meat fish, poultry, eggs, bread, salads (certain forms may   Contain sodium Pepper-Meat fish, poultry, vegetables, eggs Peppermint Extract-Desserts, baked goods Poppy  Seed-Eggs, bread, cheese, fruit dressings, baked goods, noodles, vegetables, cottage  [employer], poultry, meat, fish, cauliflower, turnips,eggs bread Saffron-Rice, bread, veal, chicken, fish, eggs Sage-Meat, fish, poultry, onions, eggplant, tomateos, pork, stews Savory-Eggs, salads, poultry, meat, rice, vegetables, soups, pork Tarragon-Meat, poultry, fish, eggs, butter, vegetables (licorice-like flavor)  Thyme-Meat, poultry, fish, eggs, vegetables, (clover-like flavor), sauces, soups Tumeric-Salads, butter, eggs, fish, rice, vegetables (saffron-like flavor) Vanilla Extract-Baked goods, candy Vinegar-Salads, vegetables, meat marinades Walnut Extract-baked goods, candy  2. Choose your Foods Wisely   The following is a list of foods to avoid which are high in sodium:  Meats-Avoid all smoked, canned, salt cured, dried and kosher meat and fish as well as Anchovies   [employer] meats:Bologna, Liverwurst, Pastrami Canned meat or fish  Marinated herring Caviar    Pepperoni Corned Beef   Pizza Dried chipped beef  Salami Frozen breaded fish or meat Salt pork Frankfurters or hot dogs  Sardines Gefilte fish   Sausage Ham (boiled ham, Proscuitto Smoked butt    spiced ham)   Spam      TV Dinners Vegetables Canned vegetables (Regular) Relish Canned mushrooms  Sauerkraut Olives    Tomato juice Pickles  Bakery and Dessert Products Canned puddings  Cream pies Cheesecake   Decorated cakes Cookies  Beverages/Juices Tomato juice, regular  Gatorade   V-8 vegetable juice, regular  Breads and Cereals Biscuit mixes   Salted potato chips, corn chips, pretzels Bread stuffing mixes  Salted crackers and rolls Pancake and waffle mixes Self-rising flour  Seasonings Accent    Meat sauces Barbecue sauce  Meat tenderizer Catsup    Monosodium glutamate (MSG) Celery salt  Onion salt Chili sauce   Prepared mustard Garlic salt   Salt, seasoned  salt, sea salt Gravy mixes   Soy sauce Horseradish   Steak sauce Ketchup   Tartar sauce Lite salt    Teriyaki sauce Marinade mixes   Worcestershire sauce  Others Baking powder   Cocoa and cocoa mixes Baking soda   Commercial casserole mixes Candy-caramels, chocolate  Dehydrated soups    Bars, fudge,nougats  Instant rice and pasta mixes Canned broth or soup  Maraschino cherries Cheese, aged and processed cheese and cheese spreads  Learning Assessment Quiz  Indicated T (for True) or F (for False) for each of the following statements:  1. _____ Fresh fruits and vegetables and unprocessed grains are generally low in sodium 2. _____ Water may contain a considerable amount of sodium, depending on the source 3. _____ You can always tell if a food is high in sodium by tasting it 4. _____ Certain laxatives my be high in sodium and should be avoided unless prescribed   by a physician or pharmacist 5. _____ Salt substitutes may be used freely by anyone on a sodium restricted diet 6. _____ Sodium is present in table salt, food additives and as a natural component of   most foods 7. _____ Table salt is approximately 90% sodium 8. _____ Limiting sodium intake may help prevent excess fluid accumulation in the body 9. _____ On a sodium-restricted diet, seasonings such as bouillon soy sauce, and    cooking wine should be used in place of table salt 10. _____ On an ingredient list, a product which lists monosodium glutamate as the first   ingredient is an appropriate food to include on a low sodium diet  Circle the best answer(s) to the following statements (Hint: there may be more than one correct answer)  11. On a low-sodium diet, some acceptable snack items are:    A. Olives  F. Bean dip   K. Grapefruit juice    B. Salted Pretzels G. Commercial Popcorn   L. Canned peaches    C. Carrot Sticks  H. Bouillon   M. Unsalted nuts   D. [place] fries  I. Peanut butter crackers N. Salami   E. Sweet  pickles J. Tomato Juice   O. Pizza  12.  Seasonings that may be used freely on a reduced - sodium diet include   A. Lemon wedges F.Monosodium glutamate K. Celery seed    B.Soysauce   G. Pepper   L. Mustard powder   C. Sea salt  H. Cooking wine  M. Onion flakes   D. Vinegar  E. Prepared horseradish N. Salsa   E. Sage   J. Worcestershire sauce  O. Chutney

## 2018-12-22 NOTE — Addendum Note (Signed)
Addended by: Drue Novel I on: 12/22/2018 08:29 AM   Modules accepted: Orders

## 2018-12-22 NOTE — Telephone Encounter (Signed)
Outpatient Medication Detail   Disp Refills Start End   atorvastatin (LIPITOR) 10 MG tablet 90 tablet 3 12/22/2018    Sig - Route: Take 1 tablet (10 mg total) by mouth daily at 6 PM. - Oral   Sent to pharmacy as: atorvastatin (LIPITOR) 10 MG tablet   E-Prescribing Status: Receipt confirmed by pharmacy (12/22/2018 8:31 AM EST)   Pharmacy  [place], [place] [address]

## 2018-12-22 NOTE — Progress Notes (Signed)
Virtual Visit via Telephone Note   This visit type was conducted due to national recommendations for restrictions regarding the COVID-19 Pandemic (e.g. social distancing) in an effort to limit this patient's exposure and mitigate transmission in our community.  Due to his co-morbid illnesses, this patient is at least at moderate risk for complications without adequate follow up.  This format is felt to be most appropriate for this patient at this time.  The patient did not have access to video technology/had technical difficulties with video requiring transitioning to audio format only (telephone).  All issues noted in this document were discussed and addressed.  No physical exam could be performed with this format.  Please refer to the patient's chart for his  consent to telehealth for [facility].   Date:  12/22/2018   ID:  Joseph [name], DOB [birth year], MRN [id number]  Patient Location: Home Provider Location: Home  PCP:  Gaynelle Arabian, MD  Cardiologist:  Larae Grooms, MD  Electrophysiologist:  None   Evaluation Performed:  Follow-Up Visit  Chief Complaint:  Yearly f/u   History of Present Illness:    Joseph [name] is a 78 y.o. 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  Amiodarone stopped 11/2016 Coumadin stopped 01/2017 by Dr. Irish Lack as he was maintaining normal sinus rhythm and blood pressure was well controlled at that time.  SBE prophylaxis recommended.   I last saw him 07/2017 and doing well.  Patient has home at [facility]. Was swimming until 2 weeks ago when they shut the pool down because of [place]. Denies chest pain, shortness of breath, dizziness or presyncope. BP running high today. Hasn't had his meds yet. Does get some salt in his diet. Walks with a cane.  The patient does not have symptoms concerning for COVID-19 infection (fever,  chills, cough, or new shortness of breath).    Past Medical History:  Diagnosis Date  . Aortic stenosis   . Arthritis   . BPH (benign prostatic hyperplasia)   . Depression   . Diverticulosis   . Dyspnea   . Family history of adverse reaction to anesthesia    Pts mother had a difficult time awakening after anesthesia  . GERD (gastroesophageal reflux disease)   . Heart murmur   . Hypertension   . 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 Pre-diabetes   . Rupture of left quadriceps tendon 07/21/2014  . Skin cancer, basal cell   . Urinary incontinence    Past Surgical History:  Procedure Laterality Date  . ANAL FISSURE REPAIR    . AORTIC VALVE REPLACEMENT N/A 09/20/2016   Procedure: AORTIC VALVE REPLACEMENT (AVR) USING MAGNA EASE 23 MM PERICARDIAL TISSUE HEART VALVE MODEL 3300TFX;  Surgeon: Gaye Pollack, MD;  Location: [place];  Service: Open Heart Surgery;  Laterality: N/A;  . BACK SURGERY    . CARDIAC CATHETERIZATION    . COLONOSCOPY W/ POLYPECTOMY    . CORONARY ARTERY BYPASS GRAFT N/A 09/20/2016   Procedure: CORONARY ARTERY BYPASS GRAFTING (CABG)x 3 WITH ENDOSCOPIC HARVESTING OF RIGHT SAPHENOUS VEIN;  Surgeon: Gaye Pollack, MD;  Location: [place];  Service: Open Heart Surgery;  Laterality: N/A;  . ELBOW SURGERY Right   . FINGER SURGERY    . left shoulder RTC tear    . LEG SURGERY     tendon repair right leg  .  MICRODISCECTOMY LUMBAR     l5-s1  . QUADRICEPS TENDON REPAIR Left 07/21/2014   Procedure: REPAIR LEFT QUADRICEP TENDON;  Surgeon: Marchia Bond, MD;  Location: [place];  Service: Orthopedics;  Laterality: Left;  . RIGHT/LEFT HEART CATH AND CORONARY ANGIOGRAPHY N/A 09/03/2016   Procedure: RIGHT/LEFT HEART CATH AND CORONARY ANGIOGRAPHY;  Surgeon: Jettie Booze, MD;  Location: [place] CV LAB;  Service: Cardiovascular;  Laterality: N/A;  . TEE WITHOUT CARDIOVERSION N/A 09/20/2016   Procedure: TRANSESOPHAGEAL  ECHOCARDIOGRAM (TEE);  Surgeon: Gaye Pollack, MD;  Location: [place];  Service: Open Heart Surgery;  Laterality: N/A;  . TONSILLECTOMY    . 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   Procedure: SHUNT INSERTION VENTRICULAR-PERITONEAL;  Surgeon: Eustace Moore, MD;  Location: [place] NEUR[facility] ORS;  Service: Neurosurgery;  Laterality: Right;     Current Meds  Medication Sig  . acetaminophen (TYLENOL) 500 MG tablet Take 500-1,000 mg by mouth 4 (four) times daily as needed for moderate pain.   Marland Kitchen ammonium lactate (AMLACTIN) 12 % cream Apply topically as needed.  Marland Kitchen amoxicillin (AMOXIL) 500 MG capsule Take 500 mg by mouth See admin instructions. Take 4 capsules 1 hour prior to dental procedures  . aspirin EC 81 MG tablet Take 81 mg by mouth daily.   Marland Kitchen atorvastatin (LIPITOR) 10 MG tablet Take 1 tablet (10 mg total) by mouth daily at 6 PM. Please make overdue appt with Dr. Irish Lack before anymore refills. 1st attempt  . B Complex Vitamins (B-COMPLEX HIGH POTENCY PO) Take 1 tablet by mouth daily.   Marland Kitchen diltiazem (CARDIZEM CD) 240 MG 24 hr capsule Take 1 capsule (240 mg total) by mouth daily.  Marland Kitchen docusate sodium (COLACE) 100 MG capsule Take 100 mg by mouth daily.   Marland Kitchen ketoconazole (NIZORAL) 2 % shampoo Apply 1 application topically 2 (two) times a week.  . lansoprazole (PREVACID) 30 MG capsule Take 30 mg by mouth 2 (two) times daily.   . mirabegron ER (MYRBETRIQ) 50 MG TB24 tablet Take 50 mg by mouth daily.   . nabumetone (RELAFEN) 750 MG tablet Take 1,500 mg by mouth daily.   . psyllium (METAMUCIL) 0.52 g capsule Take 1.56 g by mouth daily.  Marland Kitchen tolterodine (DETROL LA) 2 MG 24 hr capsule Take 2 mg by mouth every morning.     Allergies:   Ace inhibitors, Atenolol, Beta adrenergic blockers, Erythromycin, and Ciprofloxacin   Social History   Tobacco Use  . Smoking status: Former Smoker    Types: Cigarettes, Cigars, Pipe  . Smokeless tobacco: Never Used  . Tobacco comment: Quit in the 80's  Substance Use  Topics  . Alcohol use: Yes    Alcohol/week: 2.0 - 3.0 standard drinks    Types: 2 - 3 Glasses of wine per week    Comment: occasional 1-2 times week  . Drug use: No     Family Hx: The patient's family history includes Heart attack in his maternal grandmother; Heart disease in his mother. There is no history of Hypertension or Stroke.  ROS:   Please see the history of present illness.      All other systems reviewed and are negative.   Prior CV studies:   The following studies were reviewed today:   Intra-Op TEE 8/2018Conclusions    Result status: Final result   Left ventricle: Normal cavity size, wall thickness, left ventricular diastolic function and left atrial pressure. LV systolic function is mildly reduced with an EF of 45-50%. There are no  obvious wall motion abnormalities.  Aortic valve: The valve is trileaflet. Severe valve thickening present. Severe valve calcification present. Severely decreased leaflet separation. Severe stenosis. Mild to moderate regurgitation.  Mitral valve: Mild regurgitation.  Right ventricle: Normal cavity size, wall thickness and ejection fraction.  Tricuspid valve: Mild regurgitation.        2D echo 08/12/2016------------------------------------------------------------------- Study Conclusions   - Left ventricle: The cavity size was normal. Wall thickness was   normal. Systolic function was normal. The estimated ejection   fraction was in the range of 55% to 60%. Wall motion was normal;   there were no regional wall motion abnormalities. There was   fusion of early and atrial contributions to ventricular filling. - Aortic valve: Valve mobility was severely restricted. There was   severe stenosis. Mean gradient (S): 46 mm Hg. Peak gradient (S):   76 mm Hg. Valve area (VTI): 0.88 cm^2. Valve area (Vmax): 0.82   cm^2. Valve area (Vmean): 0.81 cm^2.   Cardiac catheterization 08/2016 ANGIOGRAPHY  Conclusion       Mid LAD lesion, 90  %stenosed. Calcified.  Mid RCA lesion, 100 %stenosed.  Prox Cx to Mid Cx lesion, 50 %stenosed.  Ramus lesion, 25 %stenosed.  CO 5.8 L/min. CI 2.4. Ao sat 96%. PA sat 71%.  Severe right subclavian tortuosity which precludes torquing catheters. Would not attempt right radial access given this tortuosity.   Will discuss with CT surgery regarding CABG/AVR.         Labs/Other Tests and Data Reviewed:    EKG:  No ECG reviewed.  Recent Labs: No results found for requested labs within last 8760 hours.   Recent Lipid Panel Lab Results  Component Value Date/Time   CHOL 130 01/31/2017 08:55 AM   TRIG 110 01/31/2017 08:55 AM   HDL 62 01/31/2017 08:55 AM   CHOLHDL 2.1 01/31/2017 08:55 AM   LDLCALC 46 01/31/2017 08:55 AM    Wt Readings from Last 3 Encounters:  12/22/18 265 lb (120.2 kg)  07/30/17 257 lb (116.6 kg)  02/12/17 258 lb 6.1 oz (117.2 kg)     Objective:    Vital Signs:  BP (!) 165/90   Pulse 64   Ht 5\' 10"  (1.778 m)   Wt 265 lb (120.2 kg)   BMI 38.02 kg/m    VITAL SIGNS:  reviewed  ASSESSMENT & PLAN:    CAD status post CABG x3 and pericardial AVR 08/2016 needs SBE prophylaxis.  Doing well without angina or cardiac complaints.  Follow-up with Dr. Irish Lack in 1 yr.    Essential hypertension blood pressure running high when he first took it. Repeat 165/90. Check BP daily & call us in 2 weeks, 2 gm sodium diet.     Postop atrial fibrillation treated with amiodarone and Coumadin both have been stopped and has maintained normal sinus rhythm  Hyperlipidemia LDL 55-checked recently by PCP last week.   Morbid obesity regular exercise and weight loss program such as weight watchers recommended to the patient. Can't swim because pool is closed.   Marland Kitchen     COVID-19 Education: The signs and symptoms of COVID-19 were discussed with the patient and how to seek care for testing (follow up with PCP or arrange E-visit).  The importance of social distancing was discussed  today.  Time:   Today, I have spent 23:12 minutes with the patient with telehealth technology discussing the above problems.     Medication Adjustments/Labs and Tests Ordered: Current medicines are reviewed at length with the  patient today.  Concerns regarding medicines are outlined above.   Tests Ordered: No orders of the defined types were placed in this encounter.   Medication Changes: No orders of the defined types were placed in this encounter.   Follow Up:  In Person in 1 year(s) Dr. Irish Lack  Signed, Ermalinda Barrios, PA-C  12/22/2018 8:25 AM    [place]

## 2018-12-23 IMAGING — CR DG CHEST 2V
2 series · 2 of 2 positions shown · non-contrast
Comparison: Chest x-ray of February 17, 2014

CLINICAL DATA: Preoperative examination prior to CABG. History of
severe aortic stenosis as well as coronary artery disease.

EXAM:
CHEST  2 VIEW

[w chest pa]
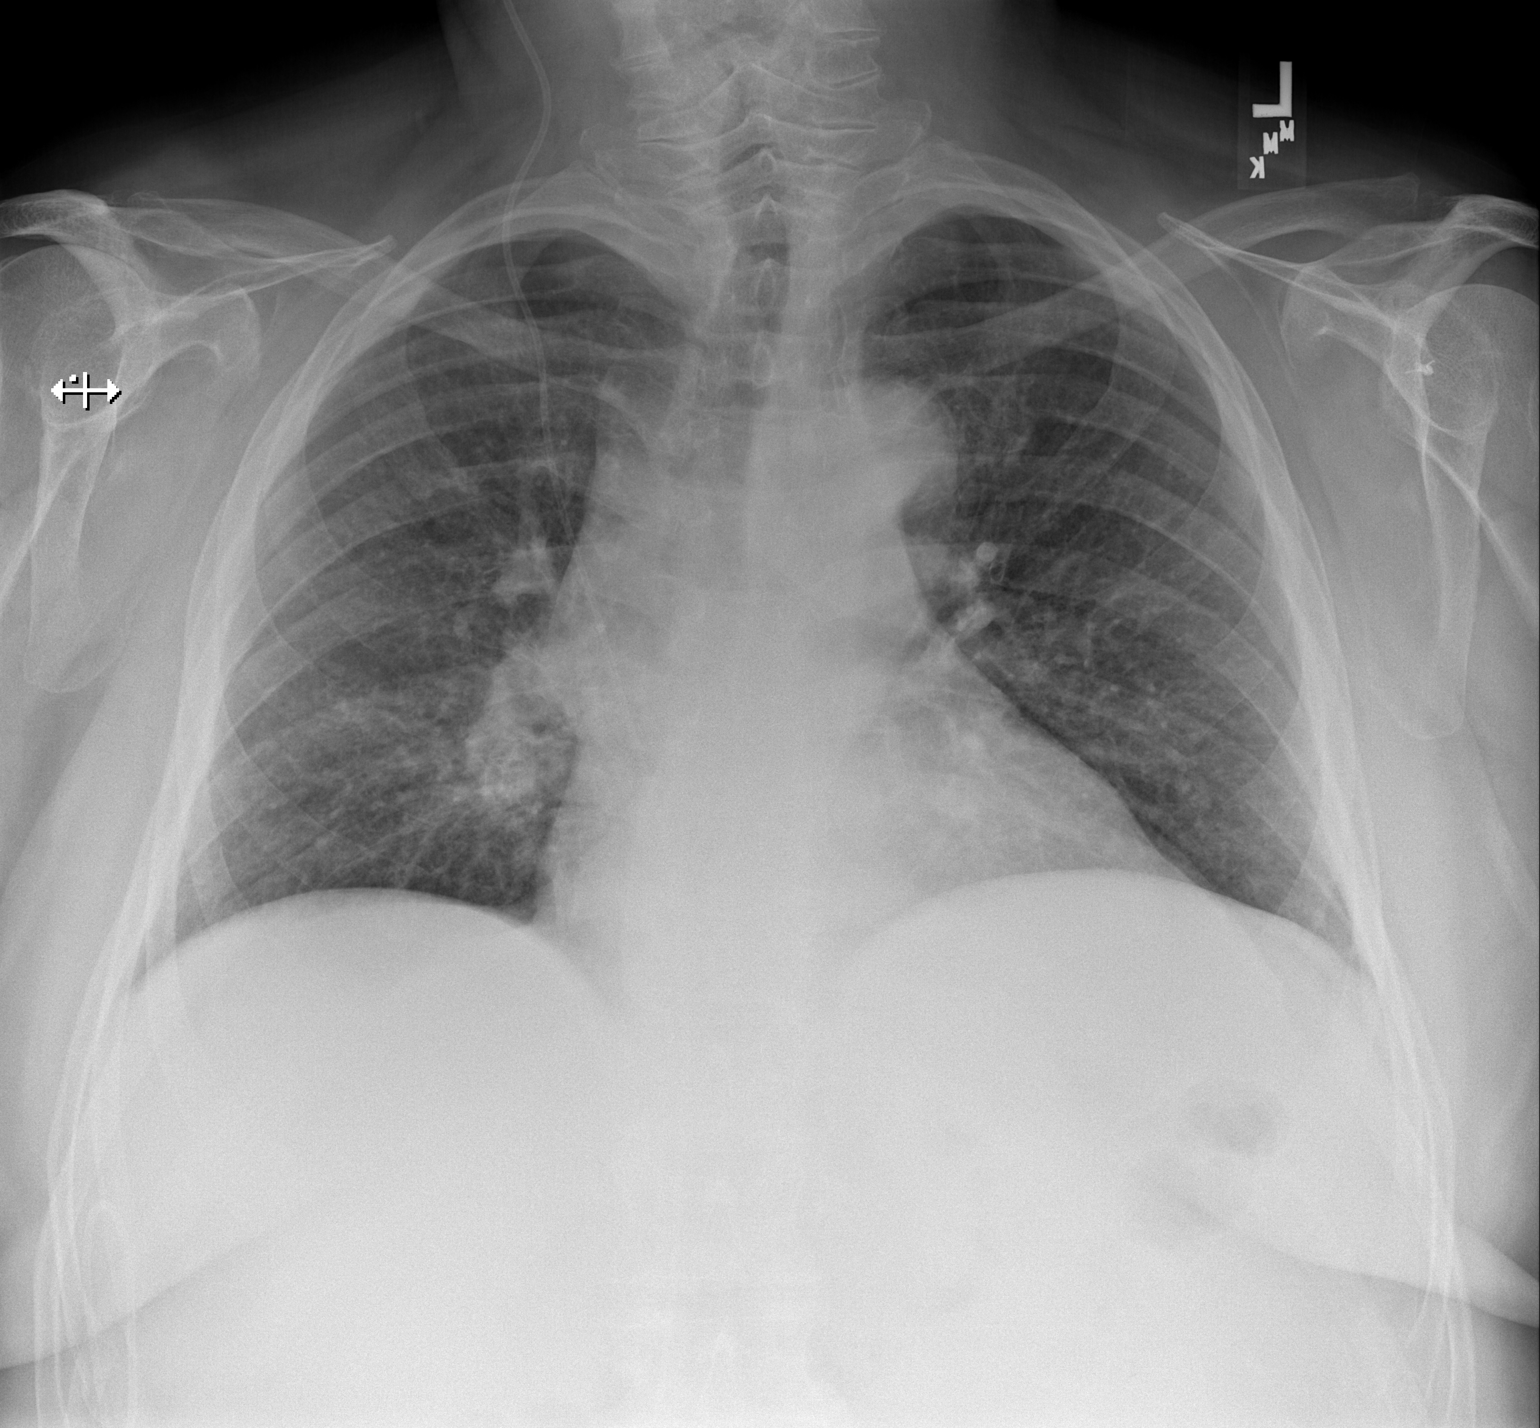

[w chest lat]
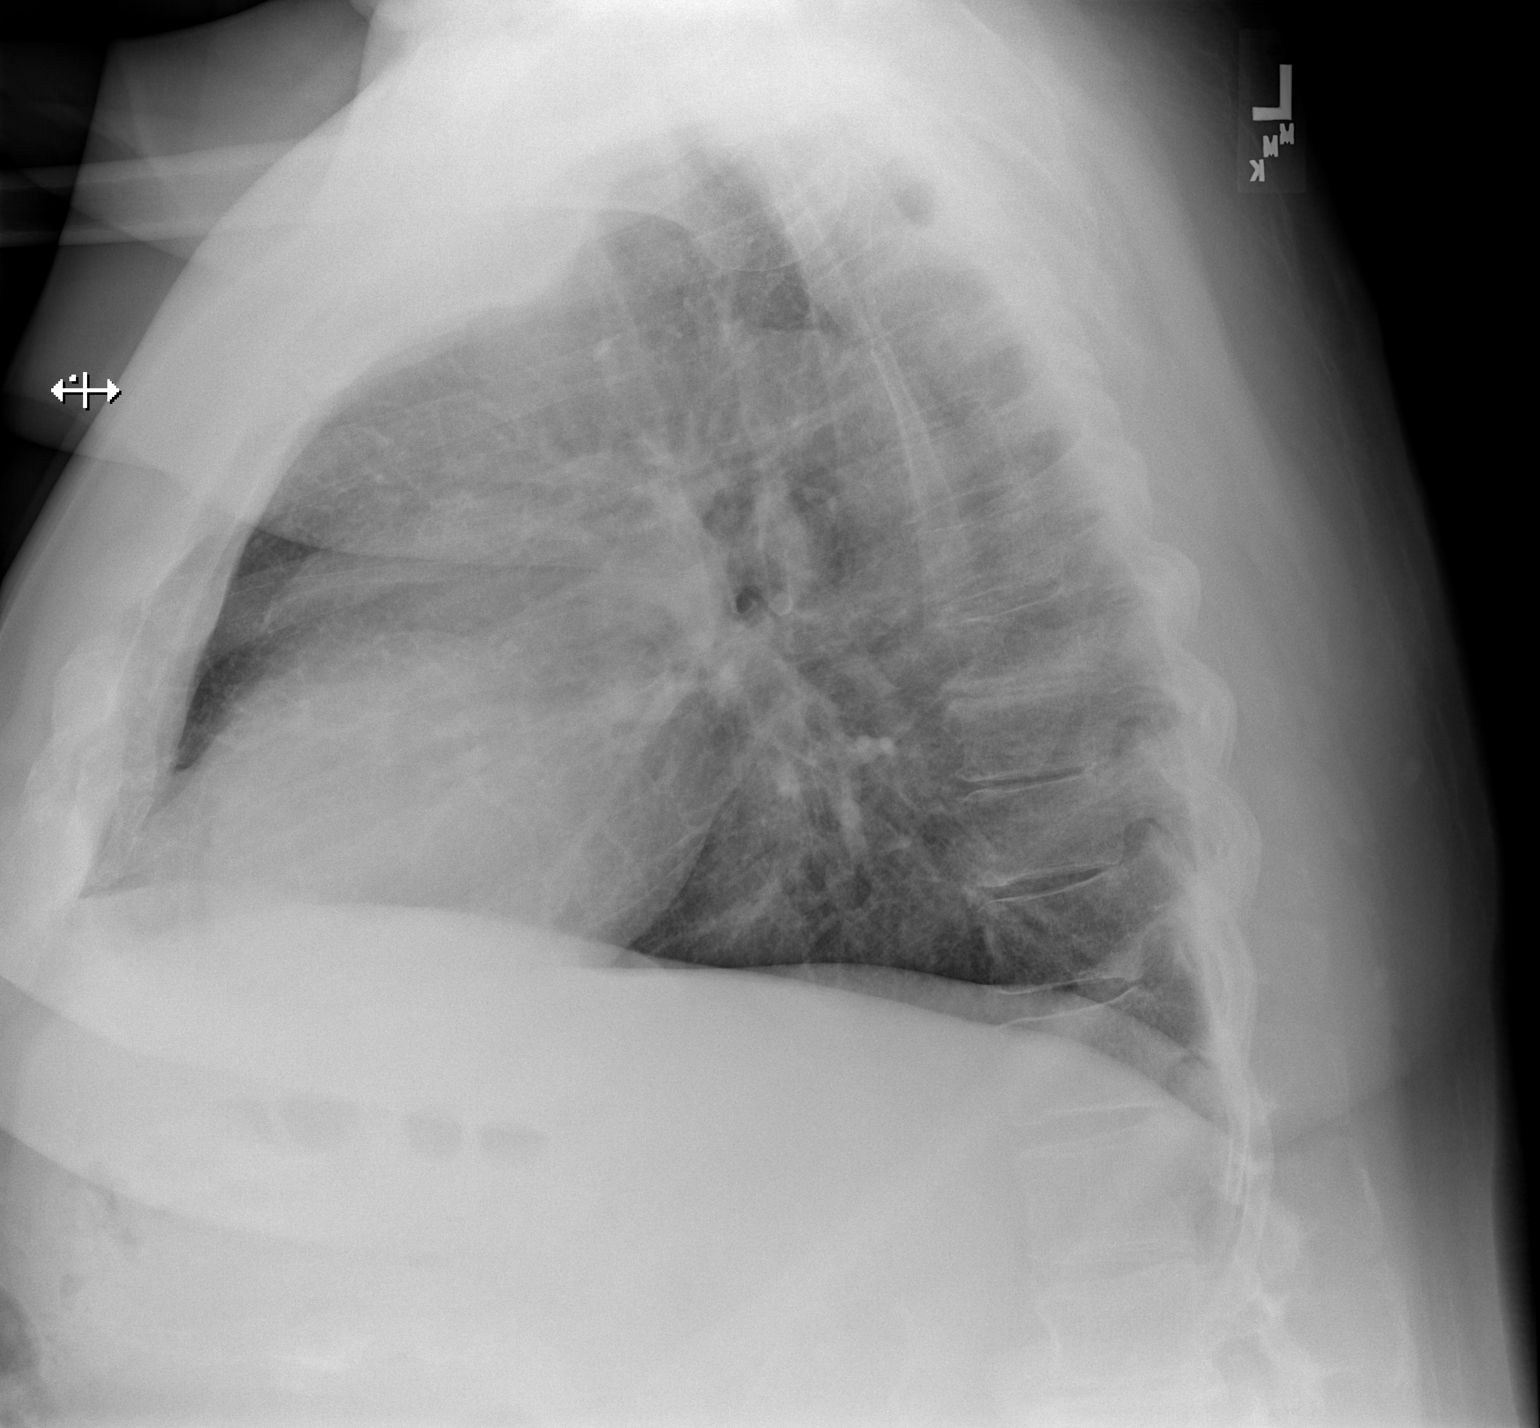

[2 of 2 positions shown; findings below may reference images not displayed]

FINDINGS: The lungs are reasonably well inflated. The interstitial markings
are mildly increased but not greatly changed from the previous
study. The heart is normal in size. There is tortuosity of the
ascending and descending thoracic aorta with faint mural
calcification. There is no pleural effusion. There is multilevel
degenerative disc disease of the thoracic spine. A
ventriculoperitoneal shunt tube is present to the right of midline.
IMPRESSION: Mild chronic interstitial prominence likely reflects the patient's
smoking history. There is no acute cardiopulmonary abnormality.

Thoracic aortic atherosclerosis.

## 2018-12-25 IMAGING — DX DG CHEST 1V PORT
1 series · 1 of 1 positions shown · non-contrast
Comparison: One-view chest x-ray 09/20/2016.

CLINICAL DATA: CABG x 3 and aortic valve replacement yesterday.

EXAM:
PORTABLE CHEST 1 VIEW

[chest ap]
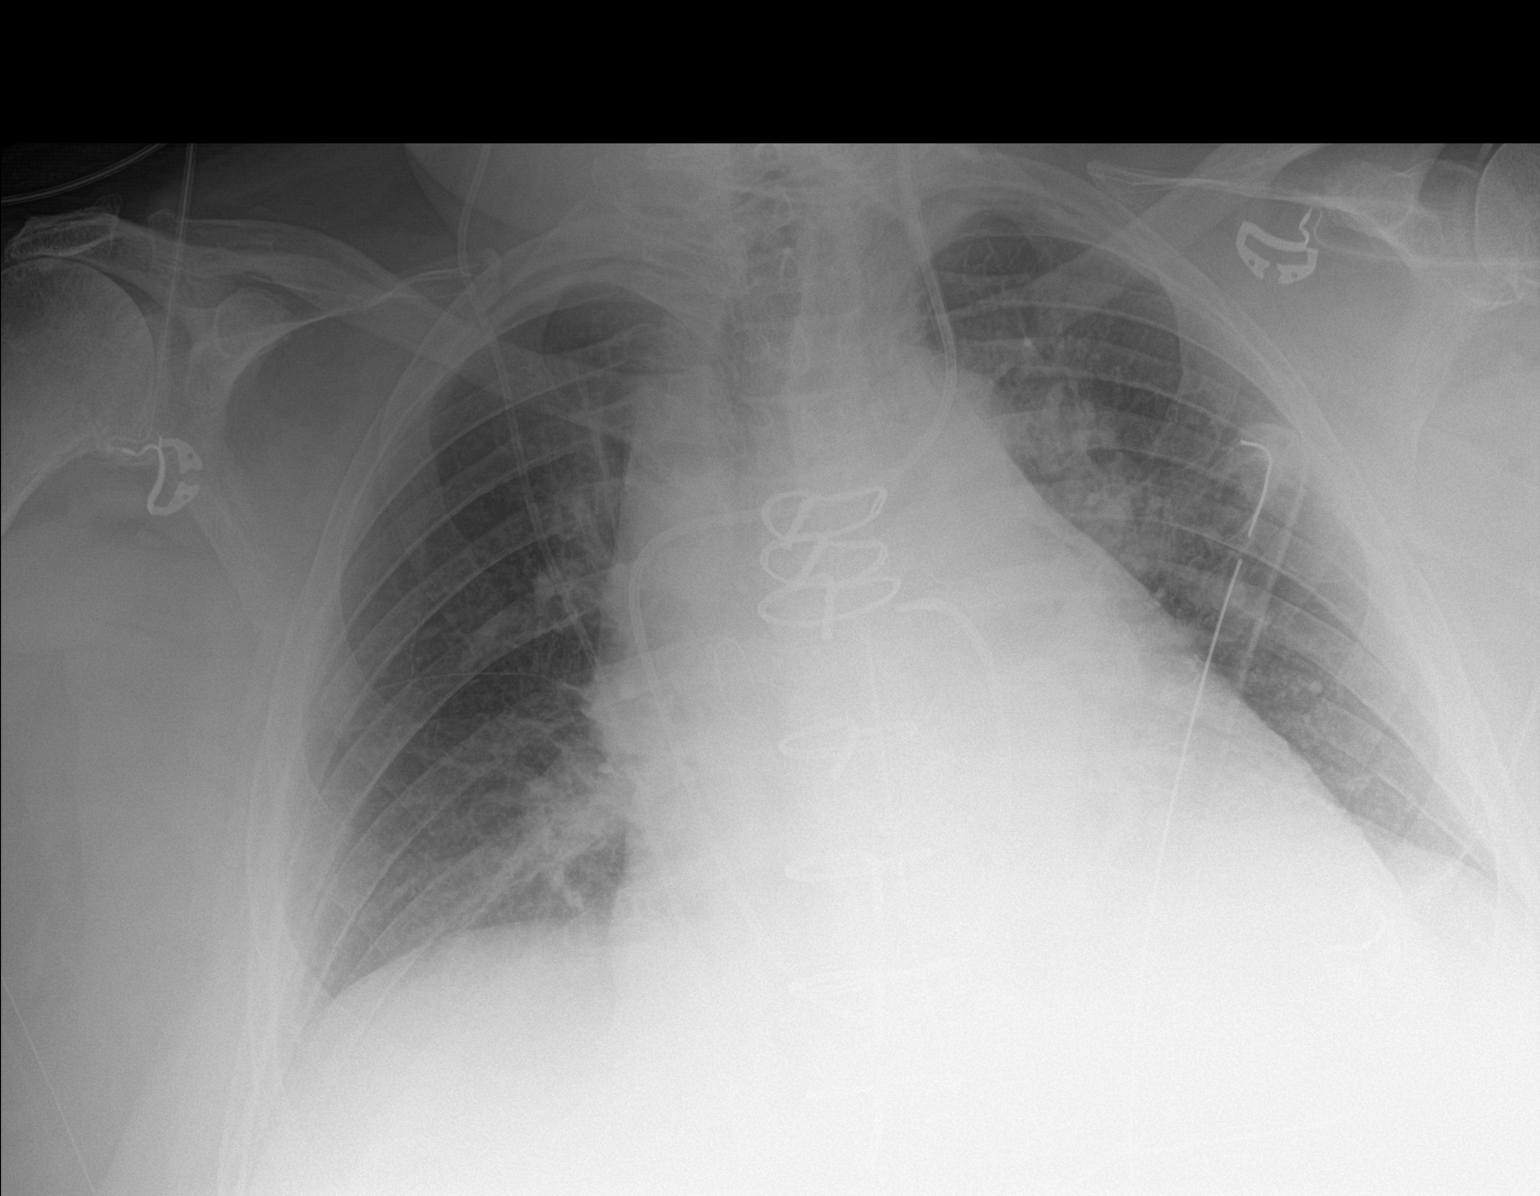

[1 of 1 positions shown; findings below may reference images not displayed]

FINDINGS: The patient has been extubated. NG tube was removed. The Swan-Ganz
catheter entering via a left IJ sheath terminates in the main
pulmonary outflow tract. Mediastinal drain and left-sided chest tube
are in place.

Cardiomegaly aunt mild edema are again noted. Aeration is slightly
improved. Lung volumes remain low. There is no pneumothorax.
IMPRESSION: 1. Interval extubation and removal of NG tube.
2. Support apparatus is otherwise stable.
3. No significant pneumothorax or pneumomediastinum.
4. Improving aeration.
5. Cardiomegaly with residual mild edema.

## 2019-01-07 ENCOUNTER — Telehealth: Payer: Self-pay | Admitting: Physician Assistant

## 2019-01-07 NOTE — Telephone Encounter (Signed)
Informed patient we did receive fax. Informed patient that information would be put in this note so Ermalinda Barrios PA could review.  DATE  BP  Pulse  12/25/18   142/88  85 12/26/18 133/84  92 12/27/18 140/96  77 12/28/18 144/96  77 12/29/18 147/101 86 12/30/18 129/84  90 12/31/18 142/100 94 01/01/19 144/93  90 01/02/19 148/103 72 01/03/19 142/83  82 01/04/19 138/90  93 01/05/19 134/86  77

## 2019-01-07 NOTE — Telephone Encounter (Signed)
Patient calling stating he faxed over a list of BP readings requested by Estella Husk. He says he has not heard anything yet and would like a call back.

## 2019-01-08 NOTE — Telephone Encounter (Signed)
BP running high. Would add hydralazine 25 mg once daily. Continue to check BP's daily. Needs to try to get some exercise and weight loss. Ask him if he's interested in a referal to the weight loss center? thanks

## 2019-01-12 MED ORDER — HYDRALAZINE HCL 25 MG PO TABS: 25.0000 mg | ORAL_TABLET | Freq: Three times a day (TID) | ORAL | 3 refills | Status: DC

## 2019-01-12 NOTE — Telephone Encounter (Signed)
Spoke with Ermalinda Barrios, PA and patient is to start hydralazine 25 mg TID.

## 2019-01-12 NOTE — Telephone Encounter (Signed)
Called and spoke to patient and made him aware that Ermalinda Barrios, PA would like for him to start hydralazine 25 mg TID. Rx sent to preferred pharmacy. Instructed patient to monitor BP and report readings in 1 week. Encouraged exercise and weight loss. Patient is not interested in weight loss referral.

## 2019-02-02 IMAGING — CR DG CHEST 2V
2 series · 2 of 2 positions shown · non-contrast
Comparison: 09/19/2016

CLINICAL DATA: CABG 09/20/2016, shortness of breath

EXAM:
CHEST  2 VIEW

[w chest pa]
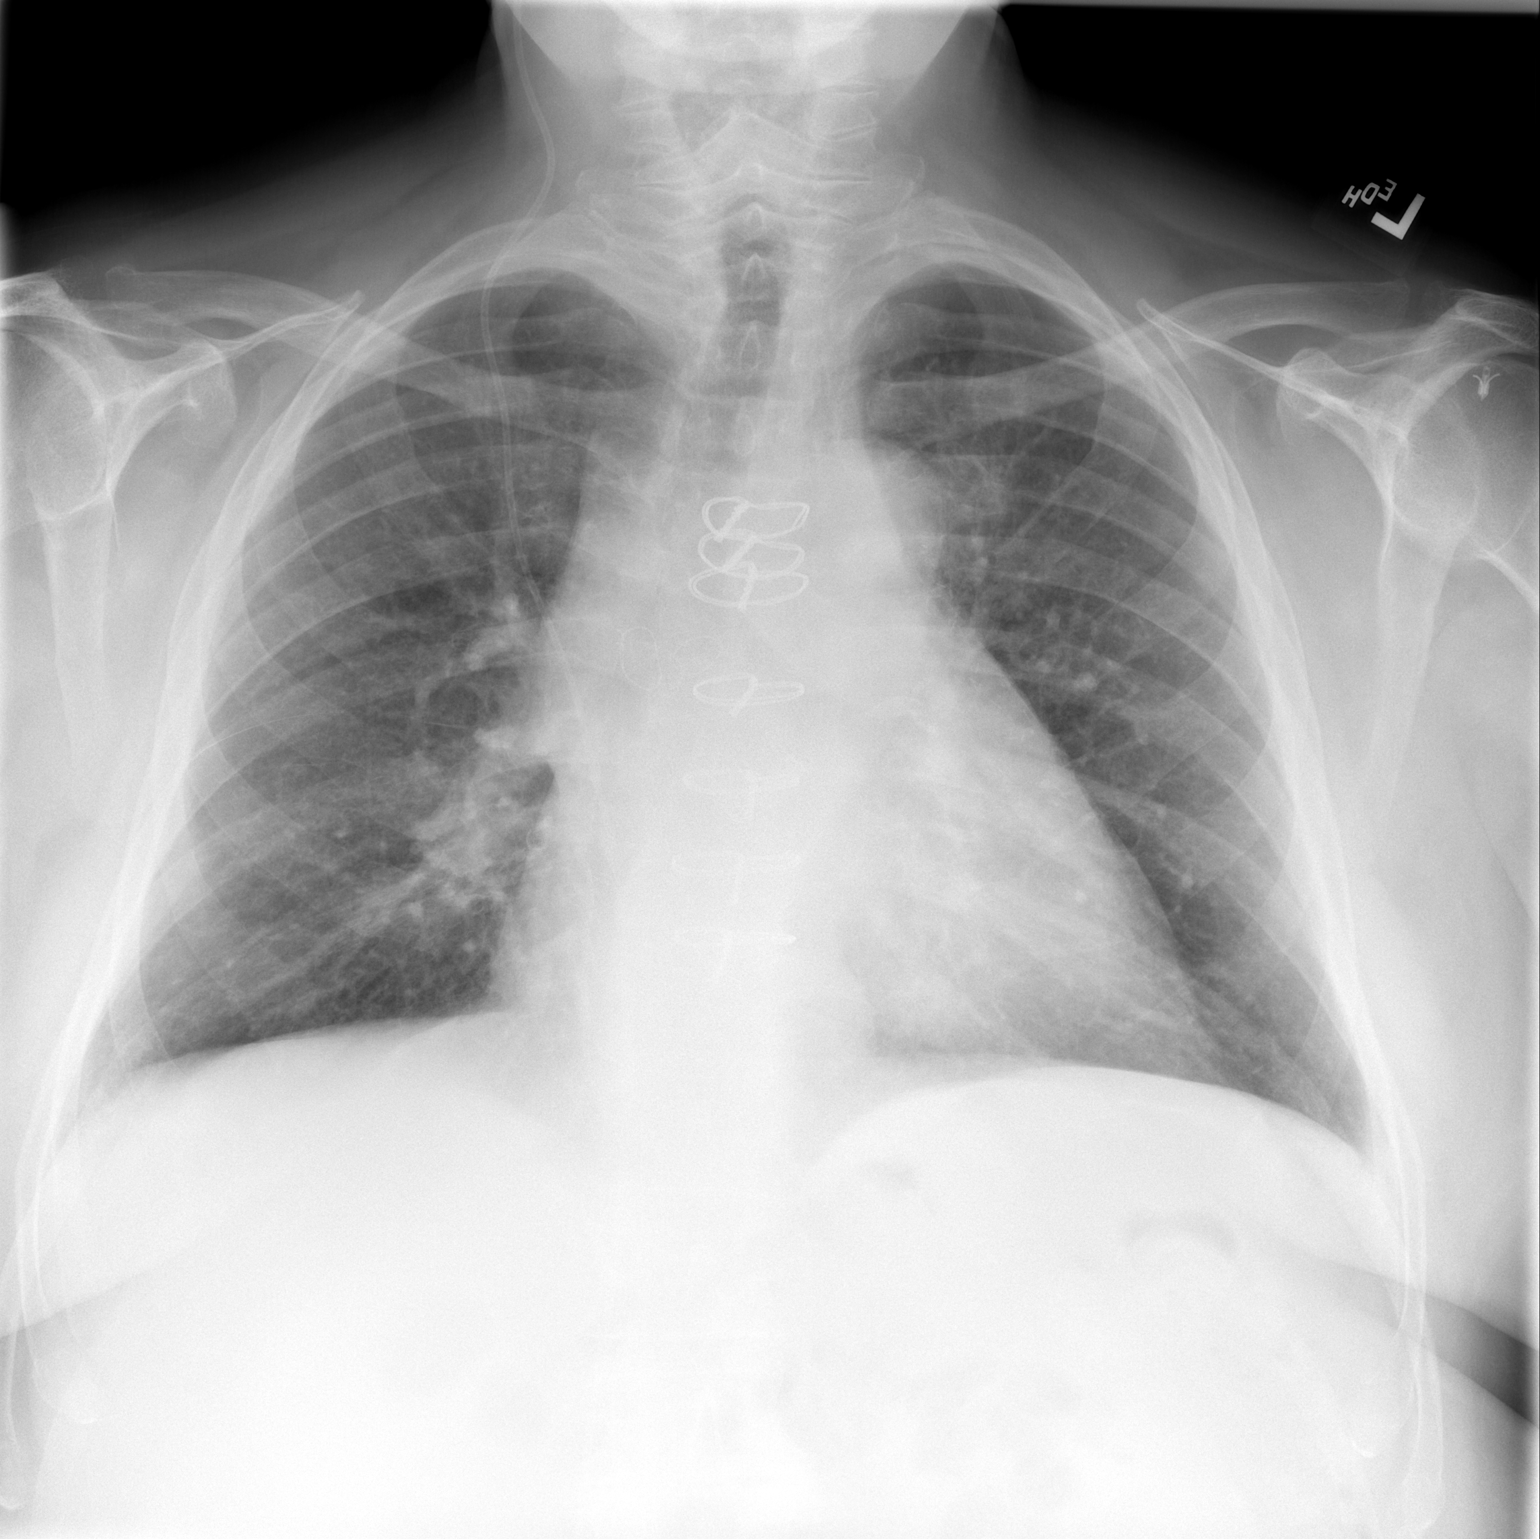

[w chest lat]
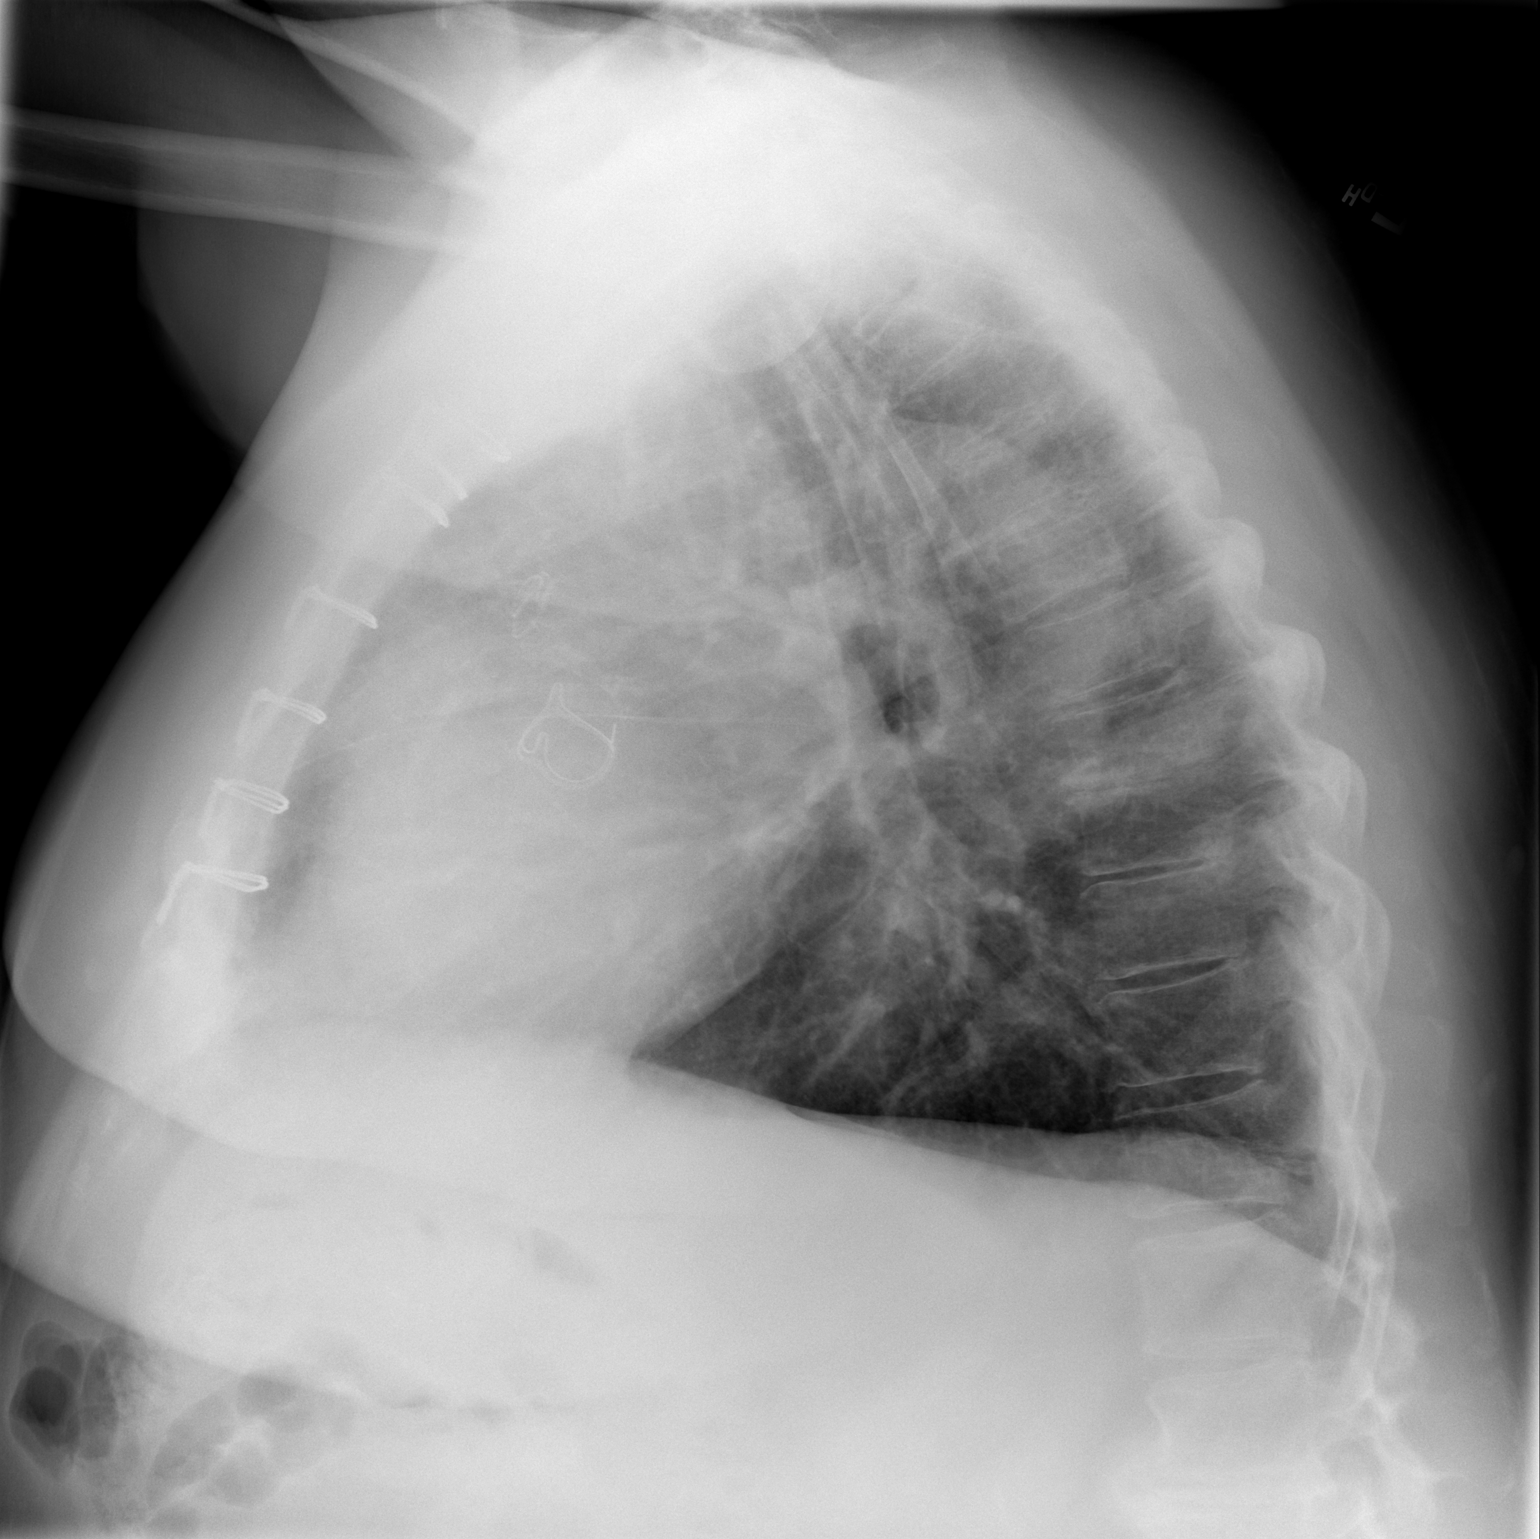

[2 of 2 positions shown; findings below may reference images not displayed]

FINDINGS: There is a chills peritoneal shunt catheter tubing partially
visualize. There is no focal parenchymal opacity. There is no
pleural effusion or pneumothorax. The heart and mediastinal contours
are unremarkable. There is evidence of recent CABG.

The osseous structures are unremarkable.
IMPRESSION: No active cardiopulmonary disease.

## 2019-03-10 DIAGNOSIS — L910 Hypertrophic scar: Secondary | ICD-10-CM | POA: Diagnosis not present

## 2019-03-10 DIAGNOSIS — L988 Other specified disorders of the skin and subcutaneous tissue: Secondary | ICD-10-CM | POA: Diagnosis not present

## 2019-03-10 DIAGNOSIS — Z85828 Personal history of other malignant neoplasm of skin: Secondary | ICD-10-CM | POA: Diagnosis not present

## 2019-03-10 DIAGNOSIS — S93492A Sprain of other ligament of left ankle, initial encounter: Secondary | ICD-10-CM | POA: Diagnosis not present

## 2019-03-10 DIAGNOSIS — L219 Seborrheic dermatitis, unspecified: Secondary | ICD-10-CM | POA: Diagnosis not present

## 2019-03-10 DIAGNOSIS — L853 Xerosis cutis: Secondary | ICD-10-CM | POA: Diagnosis not present

## 2019-03-10 DIAGNOSIS — D229 Melanocytic nevi, unspecified: Secondary | ICD-10-CM | POA: Diagnosis not present

## 2019-03-10 DIAGNOSIS — L570 Actinic keratosis: Secondary | ICD-10-CM | POA: Diagnosis not present

## 2019-03-10 DIAGNOSIS — L821 Other seborrheic keratosis: Secondary | ICD-10-CM | POA: Diagnosis not present

## 2019-03-17 DIAGNOSIS — M1990 Unspecified osteoarthritis, unspecified site: Secondary | ICD-10-CM | POA: Diagnosis not present

## 2019-03-17 DIAGNOSIS — R69 Illness, unspecified: Secondary | ICD-10-CM | POA: Diagnosis not present

## 2019-03-17 DIAGNOSIS — I10 Essential (primary) hypertension: Secondary | ICD-10-CM | POA: Diagnosis not present

## 2019-03-17 DIAGNOSIS — I2510 Atherosclerotic heart disease of native coronary artery without angina pectoris: Secondary | ICD-10-CM | POA: Diagnosis not present

## 2019-03-17 DIAGNOSIS — N400 Enlarged prostate without lower urinary tract symptoms: Secondary | ICD-10-CM | POA: Diagnosis not present

## 2019-03-17 DIAGNOSIS — H35033 Hypertensive retinopathy, bilateral: Secondary | ICD-10-CM | POA: Diagnosis not present

## 2019-03-18 DIAGNOSIS — Z86010 Personal history of colonic polyps: Secondary | ICD-10-CM | POA: Diagnosis not present

## 2019-03-22 DIAGNOSIS — N3281 Overactive bladder: Secondary | ICD-10-CM | POA: Diagnosis not present

## 2019-03-22 DIAGNOSIS — N3941 Urge incontinence: Secondary | ICD-10-CM | POA: Diagnosis not present

## 2019-03-24 DIAGNOSIS — S93492D Sprain of other ligament of left ankle, subsequent encounter: Secondary | ICD-10-CM | POA: Diagnosis not present

## 2019-04-20 DIAGNOSIS — H35033 Hypertensive retinopathy, bilateral: Secondary | ICD-10-CM | POA: Diagnosis not present

## 2019-04-20 DIAGNOSIS — I10 Essential (primary) hypertension: Secondary | ICD-10-CM | POA: Diagnosis not present

## 2019-04-20 DIAGNOSIS — R69 Illness, unspecified: Secondary | ICD-10-CM | POA: Diagnosis not present

## 2019-04-20 DIAGNOSIS — M1990 Unspecified osteoarthritis, unspecified site: Secondary | ICD-10-CM | POA: Diagnosis not present

## 2019-04-20 DIAGNOSIS — N400 Enlarged prostate without lower urinary tract symptoms: Secondary | ICD-10-CM | POA: Diagnosis not present

## 2019-04-20 DIAGNOSIS — I2510 Atherosclerotic heart disease of native coronary artery without angina pectoris: Secondary | ICD-10-CM | POA: Diagnosis not present

## 2019-04-21 DIAGNOSIS — S93492D Sprain of other ligament of left ankle, subsequent encounter: Secondary | ICD-10-CM | POA: Diagnosis not present

## 2019-04-26 DIAGNOSIS — Z1159 Encounter for screening for other viral diseases: Secondary | ICD-10-CM | POA: Diagnosis not present

## 2019-04-29 DIAGNOSIS — Z86010 Personal history of colonic polyps: Secondary | ICD-10-CM | POA: Diagnosis not present

## 2019-04-29 DIAGNOSIS — K5730 Diverticulosis of large intestine without perforation or abscess without bleeding: Secondary | ICD-10-CM | POA: Diagnosis not present

## 2019-04-29 DIAGNOSIS — D125 Benign neoplasm of sigmoid colon: Secondary | ICD-10-CM | POA: Diagnosis not present

## 2019-04-29 DIAGNOSIS — D123 Benign neoplasm of transverse colon: Secondary | ICD-10-CM | POA: Diagnosis not present

## 2019-04-29 DIAGNOSIS — Q438 Other specified congenital malformations of intestine: Secondary | ICD-10-CM | POA: Diagnosis not present

## 2019-05-04 DIAGNOSIS — D123 Benign neoplasm of transverse colon: Secondary | ICD-10-CM | POA: Diagnosis not present

## 2019-05-04 DIAGNOSIS — D125 Benign neoplasm of sigmoid colon: Secondary | ICD-10-CM | POA: Diagnosis not present

## 2019-05-10 DIAGNOSIS — N400 Enlarged prostate without lower urinary tract symptoms: Secondary | ICD-10-CM | POA: Diagnosis not present

## 2019-05-10 DIAGNOSIS — M1990 Unspecified osteoarthritis, unspecified site: Secondary | ICD-10-CM | POA: Diagnosis not present

## 2019-05-10 DIAGNOSIS — R69 Illness, unspecified: Secondary | ICD-10-CM | POA: Diagnosis not present

## 2019-05-10 DIAGNOSIS — I10 Essential (primary) hypertension: Secondary | ICD-10-CM | POA: Diagnosis not present

## 2019-05-10 DIAGNOSIS — H35033 Hypertensive retinopathy, bilateral: Secondary | ICD-10-CM | POA: Diagnosis not present

## 2019-05-10 DIAGNOSIS — I2510 Atherosclerotic heart disease of native coronary artery without angina pectoris: Secondary | ICD-10-CM | POA: Diagnosis not present

## 2019-05-12 DIAGNOSIS — L988 Other specified disorders of the skin and subcutaneous tissue: Secondary | ICD-10-CM | POA: Diagnosis not present

## 2019-05-12 DIAGNOSIS — D229 Melanocytic nevi, unspecified: Secondary | ICD-10-CM | POA: Diagnosis not present

## 2019-05-12 DIAGNOSIS — L570 Actinic keratosis: Secondary | ICD-10-CM | POA: Diagnosis not present

## 2019-05-12 DIAGNOSIS — L820 Inflamed seborrheic keratosis: Secondary | ICD-10-CM | POA: Diagnosis not present

## 2019-05-19 DIAGNOSIS — R69 Illness, unspecified: Secondary | ICD-10-CM | POA: Diagnosis not present

## 2019-05-31 DIAGNOSIS — R69 Illness, unspecified: Secondary | ICD-10-CM | POA: Diagnosis not present

## 2019-06-10 DIAGNOSIS — R69 Illness, unspecified: Secondary | ICD-10-CM | POA: Diagnosis not present

## 2019-06-11 DIAGNOSIS — R69 Illness, unspecified: Secondary | ICD-10-CM | POA: Diagnosis not present

## 2019-06-11 DIAGNOSIS — N400 Enlarged prostate without lower urinary tract symptoms: Secondary | ICD-10-CM | POA: Diagnosis not present

## 2019-06-11 DIAGNOSIS — I10 Essential (primary) hypertension: Secondary | ICD-10-CM | POA: Diagnosis not present

## 2019-06-11 DIAGNOSIS — H35033 Hypertensive retinopathy, bilateral: Secondary | ICD-10-CM | POA: Diagnosis not present

## 2019-06-11 DIAGNOSIS — I2510 Atherosclerotic heart disease of native coronary artery without angina pectoris: Secondary | ICD-10-CM | POA: Diagnosis not present

## 2019-06-11 DIAGNOSIS — M1990 Unspecified osteoarthritis, unspecified site: Secondary | ICD-10-CM | POA: Diagnosis not present

## 2019-06-22 DIAGNOSIS — H40013 Open angle with borderline findings, low risk, bilateral: Secondary | ICD-10-CM | POA: Diagnosis not present

## 2019-07-06 DIAGNOSIS — I2510 Atherosclerotic heart disease of native coronary artery without angina pectoris: Secondary | ICD-10-CM | POA: Diagnosis not present

## 2019-07-06 DIAGNOSIS — N400 Enlarged prostate without lower urinary tract symptoms: Secondary | ICD-10-CM | POA: Diagnosis not present

## 2019-07-06 DIAGNOSIS — H35033 Hypertensive retinopathy, bilateral: Secondary | ICD-10-CM | POA: Diagnosis not present

## 2019-07-06 DIAGNOSIS — I10 Essential (primary) hypertension: Secondary | ICD-10-CM | POA: Diagnosis not present

## 2019-07-06 DIAGNOSIS — R69 Illness, unspecified: Secondary | ICD-10-CM | POA: Diagnosis not present

## 2019-07-06 DIAGNOSIS — M1990 Unspecified osteoarthritis, unspecified site: Secondary | ICD-10-CM | POA: Diagnosis not present

## 2019-07-07 DIAGNOSIS — D229 Melanocytic nevi, unspecified: Secondary | ICD-10-CM | POA: Diagnosis not present

## 2019-07-07 DIAGNOSIS — L821 Other seborrheic keratosis: Secondary | ICD-10-CM | POA: Diagnosis not present

## 2019-07-07 DIAGNOSIS — L570 Actinic keratosis: Secondary | ICD-10-CM | POA: Diagnosis not present

## 2019-07-07 DIAGNOSIS — L219 Seborrheic dermatitis, unspecified: Secondary | ICD-10-CM | POA: Diagnosis not present

## 2019-07-07 DIAGNOSIS — Z85828 Personal history of other malignant neoplasm of skin: Secondary | ICD-10-CM | POA: Diagnosis not present

## 2019-07-22 DIAGNOSIS — K219 Gastro-esophageal reflux disease without esophagitis: Secondary | ICD-10-CM | POA: Diagnosis not present

## 2019-07-22 DIAGNOSIS — N400 Enlarged prostate without lower urinary tract symptoms: Secondary | ICD-10-CM | POA: Diagnosis not present

## 2019-07-22 DIAGNOSIS — I2510 Atherosclerotic heart disease of native coronary artery without angina pectoris: Secondary | ICD-10-CM | POA: Diagnosis not present

## 2019-07-22 DIAGNOSIS — R7303 Prediabetes: Secondary | ICD-10-CM | POA: Diagnosis not present

## 2019-07-22 DIAGNOSIS — M1990 Unspecified osteoarthritis, unspecified site: Secondary | ICD-10-CM | POA: Diagnosis not present

## 2019-07-22 DIAGNOSIS — Z1389 Encounter for screening for other disorder: Secondary | ICD-10-CM | POA: Diagnosis not present

## 2019-07-22 DIAGNOSIS — I10 Essential (primary) hypertension: Secondary | ICD-10-CM | POA: Diagnosis not present

## 2019-07-22 DIAGNOSIS — G912 (Idiopathic) normal pressure hydrocephalus: Secondary | ICD-10-CM | POA: Diagnosis not present

## 2019-07-22 DIAGNOSIS — Z0000 Encounter for general adult medical examination without abnormal findings: Secondary | ICD-10-CM | POA: Diagnosis not present

## 2019-07-22 DIAGNOSIS — Z86010 Personal history of colonic polyps: Secondary | ICD-10-CM | POA: Diagnosis not present

## 2019-09-06 DIAGNOSIS — R69 Illness, unspecified: Secondary | ICD-10-CM | POA: Diagnosis not present

## 2019-09-06 DIAGNOSIS — N400 Enlarged prostate without lower urinary tract symptoms: Secondary | ICD-10-CM | POA: Diagnosis not present

## 2019-09-06 DIAGNOSIS — H35033 Hypertensive retinopathy, bilateral: Secondary | ICD-10-CM | POA: Diagnosis not present

## 2019-09-06 DIAGNOSIS — I10 Essential (primary) hypertension: Secondary | ICD-10-CM | POA: Diagnosis not present

## 2019-09-06 DIAGNOSIS — I2510 Atherosclerotic heart disease of native coronary artery without angina pectoris: Secondary | ICD-10-CM | POA: Diagnosis not present

## 2019-09-06 DIAGNOSIS — M1990 Unspecified osteoarthritis, unspecified site: Secondary | ICD-10-CM | POA: Diagnosis not present

## 2019-09-28 DIAGNOSIS — G912 (Idiopathic) normal pressure hydrocephalus: Secondary | ICD-10-CM | POA: Diagnosis not present

## 2019-09-29 ENCOUNTER — Other Ambulatory Visit: Payer: Self-pay | Admitting: Student

## 2019-09-29 DIAGNOSIS — G912 (Idiopathic) normal pressure hydrocephalus: Secondary | ICD-10-CM

## 2019-10-05 ENCOUNTER — Ambulatory Visit
Admission: RE | Admit: 2019-10-05 | Discharge: 2019-10-05 | Disposition: A | Discharge status: Home / Self Care | Payer: PPO | Source: Ambulatory Visit | Attending: Student | Admitting: Student

## 2019-10-05 ENCOUNTER — Other Ambulatory Visit: Payer: Self-pay

## 2019-10-05 DIAGNOSIS — G319 Degenerative disease of nervous system, unspecified: Secondary | ICD-10-CM | POA: Diagnosis not present

## 2019-10-05 DIAGNOSIS — I672 Cerebral atherosclerosis: Secondary | ICD-10-CM | POA: Diagnosis not present

## 2019-10-05 DIAGNOSIS — G9389 Other specified disorders of brain: Secondary | ICD-10-CM | POA: Diagnosis not present

## 2019-10-05 DIAGNOSIS — G912 (Idiopathic) normal pressure hydrocephalus: Secondary | ICD-10-CM

## 2019-10-14 DIAGNOSIS — G912 (Idiopathic) normal pressure hydrocephalus: Secondary | ICD-10-CM | POA: Diagnosis not present

## 2019-10-16 DIAGNOSIS — Z23 Encounter for immunization: Secondary | ICD-10-CM | POA: Diagnosis not present

## 2019-10-21 DIAGNOSIS — I10 Essential (primary) hypertension: Secondary | ICD-10-CM | POA: Diagnosis not present

## 2019-10-21 DIAGNOSIS — R69 Illness, unspecified: Secondary | ICD-10-CM | POA: Diagnosis not present

## 2019-10-21 DIAGNOSIS — N400 Enlarged prostate without lower urinary tract symptoms: Secondary | ICD-10-CM | POA: Diagnosis not present

## 2019-10-21 DIAGNOSIS — M1990 Unspecified osteoarthritis, unspecified site: Secondary | ICD-10-CM | POA: Diagnosis not present

## 2019-10-21 DIAGNOSIS — H35033 Hypertensive retinopathy, bilateral: Secondary | ICD-10-CM | POA: Diagnosis not present

## 2019-10-21 DIAGNOSIS — I2510 Atherosclerotic heart disease of native coronary artery without angina pectoris: Secondary | ICD-10-CM | POA: Diagnosis not present

## 2019-11-16 DIAGNOSIS — R69 Illness, unspecified: Secondary | ICD-10-CM | POA: Diagnosis not present

## 2019-11-29 DIAGNOSIS — M25571 Pain in right ankle and joints of right foot: Secondary | ICD-10-CM | POA: Diagnosis not present

## 2019-12-08 DIAGNOSIS — B353 Tinea pedis: Secondary | ICD-10-CM | POA: Diagnosis not present

## 2019-12-08 DIAGNOSIS — L570 Actinic keratosis: Secondary | ICD-10-CM | POA: Diagnosis not present

## 2019-12-08 DIAGNOSIS — L719 Rosacea, unspecified: Secondary | ICD-10-CM | POA: Diagnosis not present

## 2019-12-27 DIAGNOSIS — M25571 Pain in right ankle and joints of right foot: Secondary | ICD-10-CM | POA: Diagnosis not present

## 2019-12-27 DIAGNOSIS — M1712 Unilateral primary osteoarthritis, left knee: Secondary | ICD-10-CM | POA: Diagnosis not present

## 2019-12-30 ENCOUNTER — Other Ambulatory Visit: Payer: Self-pay | Admitting: Orthopedic Surgery

## 2019-12-30 DIAGNOSIS — M25571 Pain in right ankle and joints of right foot: Secondary | ICD-10-CM

## 2019-12-30 DIAGNOSIS — M79671 Pain in right foot: Secondary | ICD-10-CM

## 2019-12-31 ENCOUNTER — Other Ambulatory Visit: Payer: Self-pay | Admitting: Interventional Cardiology

## 2019-12-31 MED ORDER — DILTIAZEM HCL ER COATED BEADS 240 MG PO CP24: 240.0000 mg | ORAL_CAPSULE | Freq: Every day | ORAL | 0 refills | Status: DC

## 2019-12-31 MED ORDER — ATORVASTATIN CALCIUM 10 MG PO TABS: 10.0000 mg | ORAL_TABLET | Freq: Every day | ORAL | 0 refills | Status: DC

## 2019-12-31 NOTE — Addendum Note (Signed)
Addended by: Carter Kitten D on: 12/31/2019 08:24 AM   Modules accepted: Orders

## 2020-01-04 NOTE — Progress Notes (Signed)
Cardiology Office Note   Date:  01/06/2020   ID:  Joseph [name], DOB [birth year], MRN [id number]  PCP:  Gaynelle Arabian, MD    No chief complaint on file.  CAD  Wt Readings from Last 3 Encounters:  01/06/20 260 lb 12.8 oz (118.3 kg)  12/22/18 265 lb (120.2 kg)  07/30/17 257 lb (116.6 kg)       History of Present Illness: Joseph [name] is a 79 y.o. 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 Amiodarone stopped 11/2016 Coumadin stopped 01/2017.  He was maintaining normal sinus rhythm and blood pressure was well controlled at that time. SBE prophylaxis recommended.   Swimming for exercise before COVID.  Lives at [facility].   Since the last visit, he has had right foot pain.  Now in a walking boot.    Denies : Chest pain. Dizziness. Leg edema. Nitroglycerin use. Orthopnea. Palpitations. Paroxysmal nocturnal dyspnea. Shortness of breath. Syncope.   He has avoided COVID and gotten his vaccines.   Exercise has been limited.    He eats more salads at [facility].   Past Medical History:  Diagnosis Date  . Aortic stenosis   . Arthritis   . BPH (benign prostatic hyperplasia)   . Depression   . Diverticulosis   . Dyspnea   . Family history of adverse reaction to anesthesia    Pts mother had a difficult time awakening after anesthesia  . GERD (gastroesophageal reflux disease)   . Heart murmur   . Hypertension   . Normal pressure hydrocephalus ([place])    03-03-14 had VP shunt inserted  . Pre-diabetes   . Rupture of left quadriceps tendon 07/21/2014  . Skin cancer, basal cell   . Urinary incontinence     Past Surgical History:  Procedure Laterality Date  . ANAL FISSURE REPAIR    . AORTIC VALVE REPLACEMENT N/A 09/20/2016   Procedure: AORTIC VALVE REPLACEMENT (AVR) USING MAGNA EASE 23 MM PERICARDIAL TISSUE HEART VALVE MODEL 3300TFX;  Surgeon: Gaye Pollack, MD;  Location: [facility];  Service: Open Heart Surgery;  Laterality: N/A;  . BACK SURGERY    . CARDIAC CATHETERIZATION    . COLONOSCOPY W/ POLYPECTOMY    . CORONARY ARTERY BYPASS GRAFT N/A 09/20/2016   Procedure: CORONARY ARTERY BYPASS GRAFTING (CABG)x 3 WITH ENDOSCOPIC HARVESTING OF RIGHT SAPHENOUS VEIN;  Surgeon: Gaye Pollack, MD;  Location: [facility];  Service: Open Heart Surgery;  Laterality: N/A;  . ELBOW SURGERY Right   . FINGER SURGERY    . left shoulder RTC tear    . LEG SURGERY     tendon repair right leg  . MICRODISCECTOMY LUMBAR     l5-s1  . QUADRICEPS TENDON REPAIR Left 07/21/2014   Procedure: REPAIR LEFT QUADRICEP TENDON;  Surgeon: Marchia Bond, MD;  Location: [facility];  Service: Orthopedics;  Laterality: Left;  . RIGHT/LEFT HEART CATH AND CORONARY ANGIOGRAPHY N/A 09/03/2016   Procedure: RIGHT/LEFT HEART CATH AND CORONARY ANGIOGRAPHY;  Surgeon: Jettie Booze, MD;  Location: [facility] LAB;  Service: Cardiovascular;  Laterality: N/A;  . TEE WITHOUT CARDIOVERSION N/A 09/20/2016   Procedure: TRANSESOPHAGEAL ECHOCARDIOGRAM (TEE);  Surgeon: Gaye Pollack, MD;  Location: [facility];  Service: Open Heart Surgery;  Laterality: N/A;  . TONSILLECTOMY    . 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   Procedure: SHUNT INSERTION VENTRICULAR-PERITONEAL;  Surgeon: Shanon Brow  Adah Salvage, MD;  Location: [facility];  Service: Neurosurgery;  Laterality: Right;     Current Outpatient Medications  Medication Sig Dispense Refill  . acetaminophen (TYLENOL) 500 MG tablet Take 500-1,000 mg by mouth 4 (four) times daily as needed for moderate pain.     Marland Kitchen ammonium lactate (AMLACTIN) 12 % cream Apply topically as needed.    Marland Kitchen amoxicillin (AMOXIL) 500 MG capsule Take 500 mg by mouth See admin instructions. Take 4 capsules 1 hour prior to dental procedures    . aspirin EC 81 MG tablet Take 81 mg by mouth daily.     Marland Kitchen atorvastatin (LIPITOR) 10 MG tablet Take 1 tablet (10 mg total) by mouth  daily at 6 PM. Please keep upcoming appt in December with Dr. Irish Lack for future refills. Thank you 90 tablet 0  . B Complex Vitamins (B-COMPLEX HIGH POTENCY PO) Take 1 tablet by mouth daily.     . clindamycin (CLEOCIN T) 1 % external solution Apply topically.    Marland Kitchen diltiazem (CARDIZEM CD) 240 MG 24 hr capsule Take 1 capsule (240 mg total) by mouth daily. Please keep upcoming appt in December with Dr. Irish Lack before anymore refills. Thank you 90 capsule 0  . docusate sodium (COLACE) 100 MG capsule Take 100 mg by mouth daily.     Marland Kitchen econazole nitrate 1 % cream Apply topically.    . hydrALAZINE (APRESOLINE) 25 MG tablet TAKE ONE TABLET BY MOUTH EVERY MORNING and TAKE ONE TABLET BY MOUTH AT NOON and TAKE ONE TABLET BY MOUTH EVERYDAY AT BEDTIME 270 tablet 0  . ketoconazole (NIZORAL) 2 % shampoo Apply 1 application topically 2 (two) times a week.    . lansoprazole (PREVACID) 30 MG capsule Take 30 mg by mouth 2 (two) times daily.     Marland Kitchen losartan (COZAAR) 100 MG tablet Take 100 mg by mouth daily.    . metroNIDAZOLE (METROCREAM) 0.75 % cream Apply 1 application topically 2 (two) times daily.    . mirabegron ER (MYRBETRIQ) 50 MG TB24 tablet Take 50 mg by mouth daily.     . nabumetone (RELAFEN) 750 MG tablet Take 1,500 mg by mouth daily.     . psyllium (REGULOID) 0.52 g capsule Take 1.56 g by mouth daily.    Marland Kitchen tolterodine (DETROL LA) 2 MG 24 hr capsule Take 2 mg by mouth every morning.     No current facility-administered medications for this visit.    Allergies:   Ace inhibitors, Atenolol, Beta adrenergic blockers, Erythromycin, and Ciprofloxacin    Social History:  The patient  reports that he has quit smoking. His smoking use included cigarettes, cigars, and pipe. He has never used smokeless tobacco. He reports current alcohol use of about 2.0 - 3.0 standard drinks of alcohol per week. He reports that he does not use drugs.   Family History:  The patient's family history includes Heart attack in his  maternal grandmother; Heart disease in his mother.    ROS:  Please see the history of present illness.   Otherwise, review of systems are positive for foot pain.   All other systems are reviewed and negative.    PHYSICAL EXAM: VS:  BP 110/68   Pulse 78   Ht 5\' 10"  (1.778 m)   Wt 260 lb 12.8 oz (118.3 kg)   SpO2 93%   BMI 37.42 kg/m  , BMI Body mass index is 37.42 kg/m. GEN: Well nourished, well developed, in no acute distress  HEENT: normal  Neck: no JVD, carotid bruits, or masses Cardiac: RRR; 2/6 systolic murmur, no rubs, or gallops,no edema  Respiratory:  clear to auscultation bilaterally, normal work of breathing GI: soft, nontender, nondistended, + BS MS: no deformity or atrophy ; bilateral scans for quadriceps tears Skin: warm and dry, no rash Neuro:  Strength and sensation are intact Psych: euthymic mood, full affect   EKG:   The ekg ordered today demonstrates NSR, PVC, no ST changes   Recent Labs: No results found for requested labs within last 8760 hours.   Lipid Panel    Component Value Date/Time   CHOL 130 01/31/2017 0855   TRIG 110 01/31/2017 0855   HDL 62 01/31/2017 0855   CHOLHDL 2.1 01/31/2017 0855   LDLCALC 46 01/31/2017 0855     Other studies Reviewed: Additional studies/ records that were reviewed today with results demonstrating: labs reviewed.   ASSESSMENT AND PLAN:  1. CAD: s/p CABG/AVR. SBE prophylaxis needed. No angina on medical therapy.  Using amoxicillin. 2. HTN: The current medical regimen is effective;  continue present plan and medications. 3. AFib: post op.  Amio and Coumadin were stopped in the past. No sx of AFib.  4. Hyperlipidemia: Whole food, plant based diet. LDL 49.   5. Morbid obesity: Hopefully will be able to increase activity.    Current medicines are reviewed at length with the patient today.  The patient concerns regarding his medicines were addressed.  The following changes have been made:  No change  Labs/ tests  ordered today include:  No orders of the defined types were placed in this encounter.   Recommend 150 minutes/week of aerobic exercise Low fat, low carb, high fiber diet recommended  Disposition:   FU in 1 year   Signed, Larae Grooms, MD  01/06/2020 10:12 AM    [facility] [address], [place], [place]  [address] Phone: [phone]; Fax: [phone]

## 2020-01-06 ENCOUNTER — Other Ambulatory Visit: Payer: Self-pay

## 2020-01-06 ENCOUNTER — Encounter: Payer: Self-pay | Admitting: Interventional Cardiology

## 2020-01-06 ENCOUNTER — Ambulatory Visit: Payer: Medicare HMO | Admitting: Interventional Cardiology

## 2020-01-06 VITALS — BP 110/68 | HR 78 | Ht 70.0 in | Wt 260.8 lb

## 2020-01-06 DIAGNOSIS — E782 Mixed hyperlipidemia: Secondary | ICD-10-CM

## 2020-01-06 DIAGNOSIS — I10 Essential (primary) hypertension: Secondary | ICD-10-CM

## 2020-01-06 DIAGNOSIS — Z952 Presence of prosthetic heart valve: Secondary | ICD-10-CM

## 2020-01-06 DIAGNOSIS — I4891 Unspecified atrial fibrillation: Secondary | ICD-10-CM | POA: Diagnosis not present

## 2020-01-06 DIAGNOSIS — Z951 Presence of aortocoronary bypass graft: Secondary | ICD-10-CM

## 2020-01-06 NOTE — Patient Instructions (Signed)
Medication Instructions:  Your physician recommends that you continue on your current medications as directed. Please refer to the Current Medication list given to you today.  *If you need a refill on your cardiac medications before your next appointment, please call your pharmacy*   Lab Work: none If you have labs (blood work) drawn today and your tests are completely normal, you will receive your results only by:  [place] (if you have MyChart) OR  A paper copy in the mail If you have any lab test that is abnormal or we need to change your treatment, we will call you to review the results.   Testing/Procedures: none   Follow-Up: At [facility], you and your health needs are our priority.  As part of our continuing mission to provide you with exceptional heart care, we have created designated Provider Care Teams.  These Care Teams include your primary Cardiologist (physician) and Advanced Practice Providers (APPs -  Physician Assistants and Nurse Practitioners) who all work together to provide you with the care you need, when you need it.  We recommend signing up for the patient portal called "MyChart".  Sign up information is provided on this After Visit Summary.  MyChart is used to connect with patients for Virtual Visits (Telemedicine).  Patients are able to view lab/test results, encounter notes, upcoming appointments, etc.  Non-urgent messages can be sent to your provider as well.   To learn more about what you can do with MyChart, go to [email or web address].    Your next appointment:   One year(s)  The format for your next appointment:   In Person  Provider:   You may see Larae Grooms, MD or one of the following Advanced Practice Providers on your designated Care Team:    Richardson Dopp, PA-C  Vin Escalon, [place]    Other Instructions  High-Fiber Diet Fiber, also called dietary fiber, is a type of carbohydrate that is found in fruits, vegetables, whole  grains, and beans. A high-fiber diet can have many health benefits. Your health care provider may recommend a high-fiber diet to help:  Prevent constipation. Fiber can make your bowel movements more regular.  Lower your cholesterol.  Relieve the following conditions: ? Swelling of veins in the anus (hemorrhoids). ? Swelling and irritation (inflammation) of specific areas of the digestive tract (uncomplicated diverticulosis). ? A problem of the large intestine (colon) that sometimes causes pain and diarrhea (irritable bowel syndrome, IBS).  Prevent overeating as part of a weight-loss plan.  Prevent heart disease, type 2 diabetes, and certain cancers. What is my plan? The recommended daily fiber intake in grams (g) includes:  38 g for men age 49 or younger.  30 g for men over age 69.  40 g for women age 54 or younger.  21 g for women over age 59. You can get the recommended daily intake of dietary fiber by:  Eating a variety of fruits, vegetables, grains, and beans.  Taking a fiber supplement, if it is not possible to get enough fiber through your diet. What do I need to know about a high-fiber diet?  It is better to get fiber through food sources rather than from fiber supplements. There is not a lot of research about how effective supplements are.  Always check the fiber content on the nutrition facts label of any prepackaged food. Look for foods that contain 5 g of fiber or more per serving.  Talk with a diet and nutrition specialist (dietitian) if you  have questions about specific foods that are recommended or not recommended for your medical condition, especially if those foods are not listed below.  Gradually increase how much fiber you consume. If you increase your intake of dietary fiber too quickly, you may have bloating, cramping, or gas.  Drink plenty of water. Water helps you to digest fiber. What are tips for following this plan?  Eat a wide variety of high-fiber  foods.  Make sure that half of the grains that you eat each day are whole grains.  Eat breads and cereals that are made with whole-grain flour instead of refined flour or white flour.  Eat brown rice, bulgur wheat, or millet instead of white rice.  Start the day with a breakfast that is high in fiber, such as a cereal that contains 5 g of fiber or more per serving.  Use beans in place of meat in soups, salads, and pasta dishes.  Eat high-fiber snacks, such as berries, raw vegetables, nuts, and popcorn.  Choose whole fruits and vegetables instead of processed forms like juice or sauce. What foods can I eat?  Fruits Berries. Pears. Apples. Oranges. Avocado. Prunes and raisins. Dried figs. Vegetables Sweet potatoes. Spinach. Kale. Artichokes. Cabbage. Broccoli. Cauliflower. Green peas. Carrots. Squash. Grains Whole-grain breads. Multigrain cereal. Oats and oatmeal. Brown rice. Barley. Bulgur wheat. Terra Bella. Quinoa. Bran muffins. Popcorn. Rye wafer crackers. Meats and other proteins Navy, kidney, and pinto beans. Soybeans. Split peas. Lentils. Nuts and seeds. Dairy Fiber-fortified yogurt. Beverages Fiber-fortified soy milk. Fiber-fortified orange juice. Other foods Fiber bars. The items listed above may not be a complete list of recommended foods and beverages. Contact a dietitian for more options. What foods are not recommended? Fruits Fruit juice. Cooked, strained fruit. Vegetables Fried potatoes. Canned vegetables. Well-cooked vegetables. Grains White bread. Pasta made with refined flour. White rice. Meats and other proteins Fatty cuts of meat. Fried chicken or fried fish. Dairy Milk. Yogurt. Cream cheese. Sour cream. Fats and oils Butters. Beverages Soft drinks. Other foods Cakes and pastries. The items listed above may not be a complete list of foods and beverages to avoid. Contact a dietitian for more information. Summary  Fiber is a type of carbohydrate. It is  found in fruits, vegetables, whole grains, and beans.  There are many health benefits of eating a high-fiber diet, such as preventing constipation, lowering blood cholesterol, helping with weight loss, and reducing your risk of heart disease, diabetes, and certain cancers.  Gradually increase your intake of fiber. Increasing too fast can result in cramping, bloating, and gas. Drink plenty of water while you increase your fiber.  The best sources of fiber include whole fruits and vegetables, whole grains, nuts, seeds, and beans. This information is not intended to replace advice given to you by your health care provider. Make sure you discuss any questions you have with your health care provider. Document Revised: 11/11/2016 Document Reviewed: 11/11/2016 Elsevier Patient Education  2020 [employer].

## 2020-01-17 ENCOUNTER — Ambulatory Visit
Admission: RE | Admit: 2020-01-17 | Discharge: 2020-01-17 | Disposition: A | Discharge status: Home / Self Care | Payer: Medicare HMO | Source: Ambulatory Visit | Attending: Orthopedic Surgery | Admitting: Orthopedic Surgery

## 2020-01-17 DIAGNOSIS — M25571 Pain in right ankle and joints of right foot: Secondary | ICD-10-CM

## 2020-01-17 DIAGNOSIS — R600 Localized edema: Secondary | ICD-10-CM | POA: Diagnosis not present

## 2020-01-17 DIAGNOSIS — M19071 Primary osteoarthritis, right ankle and foot: Secondary | ICD-10-CM | POA: Diagnosis not present

## 2020-01-17 DIAGNOSIS — M79671 Pain in right foot: Secondary | ICD-10-CM

## 2020-01-24 DIAGNOSIS — M25571 Pain in right ankle and joints of right foot: Secondary | ICD-10-CM | POA: Diagnosis not present

## 2020-01-25 DIAGNOSIS — H35033 Hypertensive retinopathy, bilateral: Secondary | ICD-10-CM | POA: Diagnosis not present

## 2020-01-25 DIAGNOSIS — M1990 Unspecified osteoarthritis, unspecified site: Secondary | ICD-10-CM | POA: Diagnosis not present

## 2020-01-25 DIAGNOSIS — R7303 Prediabetes: Secondary | ICD-10-CM | POA: Diagnosis not present

## 2020-01-25 DIAGNOSIS — Z86010 Personal history of colonic polyps: Secondary | ICD-10-CM | POA: Diagnosis not present

## 2020-01-25 DIAGNOSIS — Z8679 Personal history of other diseases of the circulatory system: Secondary | ICD-10-CM | POA: Diagnosis not present

## 2020-01-25 DIAGNOSIS — I2510 Atherosclerotic heart disease of native coronary artery without angina pectoris: Secondary | ICD-10-CM | POA: Diagnosis not present

## 2020-01-25 DIAGNOSIS — K219 Gastro-esophageal reflux disease without esophagitis: Secondary | ICD-10-CM | POA: Diagnosis not present

## 2020-01-25 DIAGNOSIS — Z952 Presence of prosthetic heart valve: Secondary | ICD-10-CM | POA: Diagnosis not present

## 2020-01-25 DIAGNOSIS — N400 Enlarged prostate without lower urinary tract symptoms: Secondary | ICD-10-CM | POA: Diagnosis not present

## 2020-01-25 DIAGNOSIS — G912 (Idiopathic) normal pressure hydrocephalus: Secondary | ICD-10-CM | POA: Diagnosis not present

## 2020-01-25 DIAGNOSIS — I10 Essential (primary) hypertension: Secondary | ICD-10-CM | POA: Diagnosis not present

## 2020-02-03 DIAGNOSIS — M7661 Achilles tendinitis, right leg: Secondary | ICD-10-CM | POA: Diagnosis not present

## 2020-02-03 DIAGNOSIS — M62571 Muscle wasting and atrophy, not elsewhere classified, right ankle and foot: Secondary | ICD-10-CM | POA: Diagnosis not present

## 2020-02-03 DIAGNOSIS — R2689 Other abnormalities of gait and mobility: Secondary | ICD-10-CM | POA: Diagnosis not present

## 2020-02-03 DIAGNOSIS — M25671 Stiffness of right ankle, not elsewhere classified: Secondary | ICD-10-CM | POA: Diagnosis not present

## 2020-02-03 DIAGNOSIS — R296 Repeated falls: Secondary | ICD-10-CM | POA: Diagnosis not present

## 2020-02-03 DIAGNOSIS — R2681 Unsteadiness on feet: Secondary | ICD-10-CM | POA: Diagnosis not present

## 2020-02-03 DIAGNOSIS — M25571 Pain in right ankle and joints of right foot: Secondary | ICD-10-CM | POA: Diagnosis not present

## 2020-02-10 DIAGNOSIS — R2689 Other abnormalities of gait and mobility: Secondary | ICD-10-CM | POA: Diagnosis not present

## 2020-02-10 DIAGNOSIS — R2681 Unsteadiness on feet: Secondary | ICD-10-CM | POA: Diagnosis not present

## 2020-02-10 DIAGNOSIS — R296 Repeated falls: Secondary | ICD-10-CM | POA: Diagnosis not present

## 2020-02-10 DIAGNOSIS — M25671 Stiffness of right ankle, not elsewhere classified: Secondary | ICD-10-CM | POA: Diagnosis not present

## 2020-02-10 DIAGNOSIS — M25571 Pain in right ankle and joints of right foot: Secondary | ICD-10-CM | POA: Diagnosis not present

## 2020-02-10 DIAGNOSIS — M62571 Muscle wasting and atrophy, not elsewhere classified, right ankle and foot: Secondary | ICD-10-CM | POA: Diagnosis not present

## 2020-02-10 DIAGNOSIS — M7661 Achilles tendinitis, right leg: Secondary | ICD-10-CM | POA: Diagnosis not present

## 2020-02-14 DIAGNOSIS — R2689 Other abnormalities of gait and mobility: Secondary | ICD-10-CM | POA: Diagnosis not present

## 2020-02-14 DIAGNOSIS — M62571 Muscle wasting and atrophy, not elsewhere classified, right ankle and foot: Secondary | ICD-10-CM | POA: Diagnosis not present

## 2020-02-14 DIAGNOSIS — R2681 Unsteadiness on feet: Secondary | ICD-10-CM | POA: Diagnosis not present

## 2020-02-14 DIAGNOSIS — R296 Repeated falls: Secondary | ICD-10-CM | POA: Diagnosis not present

## 2020-02-14 DIAGNOSIS — M25671 Stiffness of right ankle, not elsewhere classified: Secondary | ICD-10-CM | POA: Diagnosis not present

## 2020-02-14 DIAGNOSIS — M25571 Pain in right ankle and joints of right foot: Secondary | ICD-10-CM | POA: Diagnosis not present

## 2020-02-14 DIAGNOSIS — M7661 Achilles tendinitis, right leg: Secondary | ICD-10-CM | POA: Diagnosis not present

## 2020-02-17 DIAGNOSIS — M25671 Stiffness of right ankle, not elsewhere classified: Secondary | ICD-10-CM | POA: Diagnosis not present

## 2020-02-17 DIAGNOSIS — R2681 Unsteadiness on feet: Secondary | ICD-10-CM | POA: Diagnosis not present

## 2020-02-17 DIAGNOSIS — M25571 Pain in right ankle and joints of right foot: Secondary | ICD-10-CM | POA: Diagnosis not present

## 2020-02-17 DIAGNOSIS — M7661 Achilles tendinitis, right leg: Secondary | ICD-10-CM | POA: Diagnosis not present

## 2020-02-17 DIAGNOSIS — M62571 Muscle wasting and atrophy, not elsewhere classified, right ankle and foot: Secondary | ICD-10-CM | POA: Diagnosis not present

## 2020-02-17 DIAGNOSIS — R296 Repeated falls: Secondary | ICD-10-CM | POA: Diagnosis not present

## 2020-02-17 DIAGNOSIS — R2689 Other abnormalities of gait and mobility: Secondary | ICD-10-CM | POA: Diagnosis not present

## 2020-02-23 DIAGNOSIS — M1990 Unspecified osteoarthritis, unspecified site: Secondary | ICD-10-CM | POA: Diagnosis not present

## 2020-02-23 DIAGNOSIS — M25571 Pain in right ankle and joints of right foot: Secondary | ICD-10-CM | POA: Diagnosis not present

## 2020-02-23 DIAGNOSIS — I2510 Atherosclerotic heart disease of native coronary artery without angina pectoris: Secondary | ICD-10-CM | POA: Diagnosis not present

## 2020-02-23 DIAGNOSIS — H35033 Hypertensive retinopathy, bilateral: Secondary | ICD-10-CM | POA: Diagnosis not present

## 2020-02-23 DIAGNOSIS — I10 Essential (primary) hypertension: Secondary | ICD-10-CM | POA: Diagnosis not present

## 2020-02-23 DIAGNOSIS — N400 Enlarged prostate without lower urinary tract symptoms: Secondary | ICD-10-CM | POA: Diagnosis not present

## 2020-02-23 DIAGNOSIS — R69 Illness, unspecified: Secondary | ICD-10-CM | POA: Diagnosis not present

## 2020-02-23 DIAGNOSIS — K219 Gastro-esophageal reflux disease without esophagitis: Secondary | ICD-10-CM | POA: Diagnosis not present

## 2020-02-24 DIAGNOSIS — M2681 Anterior soft tissue impingement: Secondary | ICD-10-CM | POA: Diagnosis not present

## 2020-02-24 DIAGNOSIS — M25571 Pain in right ankle and joints of right foot: Secondary | ICD-10-CM | POA: Diagnosis not present

## 2020-02-24 DIAGNOSIS — R2689 Other abnormalities of gait and mobility: Secondary | ICD-10-CM | POA: Diagnosis not present

## 2020-02-24 DIAGNOSIS — M7661 Achilles tendinitis, right leg: Secondary | ICD-10-CM | POA: Diagnosis not present

## 2020-02-24 DIAGNOSIS — R2681 Unsteadiness on feet: Secondary | ICD-10-CM | POA: Diagnosis not present

## 2020-02-24 DIAGNOSIS — M62571 Muscle wasting and atrophy, not elsewhere classified, right ankle and foot: Secondary | ICD-10-CM | POA: Diagnosis not present

## 2020-02-24 DIAGNOSIS — R296 Repeated falls: Secondary | ICD-10-CM | POA: Diagnosis not present

## 2020-02-24 DIAGNOSIS — M25671 Stiffness of right ankle, not elsewhere classified: Secondary | ICD-10-CM | POA: Diagnosis not present

## 2020-03-08 DIAGNOSIS — D229 Melanocytic nevi, unspecified: Secondary | ICD-10-CM | POA: Diagnosis not present

## 2020-03-08 DIAGNOSIS — L570 Actinic keratosis: Secondary | ICD-10-CM | POA: Diagnosis not present

## 2020-03-08 DIAGNOSIS — Z85828 Personal history of other malignant neoplasm of skin: Secondary | ICD-10-CM | POA: Diagnosis not present

## 2020-03-08 DIAGNOSIS — L719 Rosacea, unspecified: Secondary | ICD-10-CM | POA: Diagnosis not present

## 2020-03-23 DIAGNOSIS — H35371 Puckering of macula, right eye: Secondary | ICD-10-CM | POA: Diagnosis not present

## 2020-03-23 DIAGNOSIS — H2511 Age-related nuclear cataract, right eye: Secondary | ICD-10-CM | POA: Diagnosis not present

## 2020-03-23 DIAGNOSIS — R7309 Other abnormal glucose: Secondary | ICD-10-CM | POA: Diagnosis not present

## 2020-03-23 DIAGNOSIS — H40013 Open angle with borderline findings, low risk, bilateral: Secondary | ICD-10-CM | POA: Diagnosis not present

## 2020-03-27 DIAGNOSIS — N3281 Overactive bladder: Secondary | ICD-10-CM | POA: Diagnosis not present

## 2020-03-27 DIAGNOSIS — N3941 Urge incontinence: Secondary | ICD-10-CM | POA: Diagnosis not present

## 2020-04-04 DIAGNOSIS — N400 Enlarged prostate without lower urinary tract symptoms: Secondary | ICD-10-CM | POA: Diagnosis not present

## 2020-04-04 DIAGNOSIS — H35033 Hypertensive retinopathy, bilateral: Secondary | ICD-10-CM | POA: Diagnosis not present

## 2020-04-04 DIAGNOSIS — I10 Essential (primary) hypertension: Secondary | ICD-10-CM | POA: Diagnosis not present

## 2020-04-04 DIAGNOSIS — K219 Gastro-esophageal reflux disease without esophagitis: Secondary | ICD-10-CM | POA: Diagnosis not present

## 2020-04-04 DIAGNOSIS — I2510 Atherosclerotic heart disease of native coronary artery without angina pectoris: Secondary | ICD-10-CM | POA: Diagnosis not present

## 2020-04-04 DIAGNOSIS — R69 Illness, unspecified: Secondary | ICD-10-CM | POA: Diagnosis not present

## 2020-04-04 DIAGNOSIS — M1990 Unspecified osteoarthritis, unspecified site: Secondary | ICD-10-CM | POA: Diagnosis not present

## 2020-04-07 ENCOUNTER — Other Ambulatory Visit: Payer: Self-pay | Admitting: Interventional Cardiology

## 2020-04-18 DIAGNOSIS — G912 (Idiopathic) normal pressure hydrocephalus: Secondary | ICD-10-CM | POA: Diagnosis not present

## 2020-04-26 ENCOUNTER — Other Ambulatory Visit: Payer: Self-pay | Admitting: Student

## 2020-04-26 DIAGNOSIS — G912 (Idiopathic) normal pressure hydrocephalus: Secondary | ICD-10-CM

## 2020-04-29 ENCOUNTER — Ambulatory Visit
Admission: RE | Admit: 2020-04-29 | Discharge: 2020-04-29 | Disposition: A | Discharge status: Home / Self Care | Payer: Medicare HMO | Source: Ambulatory Visit | Attending: Student | Admitting: Student

## 2020-04-29 DIAGNOSIS — G912 (Idiopathic) normal pressure hydrocephalus: Secondary | ICD-10-CM

## 2020-04-29 DIAGNOSIS — G9389 Other specified disorders of brain: Secondary | ICD-10-CM | POA: Diagnosis not present

## 2020-05-09 DIAGNOSIS — G912 (Idiopathic) normal pressure hydrocephalus: Secondary | ICD-10-CM | POA: Diagnosis not present

## 2020-05-31 DIAGNOSIS — H35033 Hypertensive retinopathy, bilateral: Secondary | ICD-10-CM | POA: Diagnosis not present

## 2020-05-31 DIAGNOSIS — K219 Gastro-esophageal reflux disease without esophagitis: Secondary | ICD-10-CM | POA: Diagnosis not present

## 2020-05-31 DIAGNOSIS — M1990 Unspecified osteoarthritis, unspecified site: Secondary | ICD-10-CM | POA: Diagnosis not present

## 2020-05-31 DIAGNOSIS — I2510 Atherosclerotic heart disease of native coronary artery without angina pectoris: Secondary | ICD-10-CM | POA: Diagnosis not present

## 2020-05-31 DIAGNOSIS — N400 Enlarged prostate without lower urinary tract symptoms: Secondary | ICD-10-CM | POA: Diagnosis not present

## 2020-05-31 DIAGNOSIS — I10 Essential (primary) hypertension: Secondary | ICD-10-CM | POA: Diagnosis not present

## 2020-05-31 DIAGNOSIS — R69 Illness, unspecified: Secondary | ICD-10-CM | POA: Diagnosis not present

## 2020-06-16 DIAGNOSIS — D123 Benign neoplasm of transverse colon: Secondary | ICD-10-CM | POA: Diagnosis not present

## 2020-06-16 DIAGNOSIS — Z86010 Personal history of colonic polyps: Secondary | ICD-10-CM | POA: Diagnosis not present

## 2020-06-16 DIAGNOSIS — K621 Rectal polyp: Secondary | ICD-10-CM | POA: Diagnosis not present

## 2020-06-16 DIAGNOSIS — K5730 Diverticulosis of large intestine without perforation or abscess without bleeding: Secondary | ICD-10-CM | POA: Diagnosis not present

## 2020-06-16 DIAGNOSIS — K648 Other hemorrhoids: Secondary | ICD-10-CM | POA: Diagnosis not present

## 2020-06-16 DIAGNOSIS — D122 Benign neoplasm of ascending colon: Secondary | ICD-10-CM | POA: Diagnosis not present

## 2020-06-21 DIAGNOSIS — D122 Benign neoplasm of ascending colon: Secondary | ICD-10-CM | POA: Diagnosis not present

## 2020-06-21 DIAGNOSIS — D123 Benign neoplasm of transverse colon: Secondary | ICD-10-CM | POA: Diagnosis not present

## 2020-06-21 DIAGNOSIS — K621 Rectal polyp: Secondary | ICD-10-CM | POA: Diagnosis not present

## 2020-06-28 DIAGNOSIS — D1801 Hemangioma of skin and subcutaneous tissue: Secondary | ICD-10-CM | POA: Diagnosis not present

## 2020-06-28 DIAGNOSIS — Z85828 Personal history of other malignant neoplasm of skin: Secondary | ICD-10-CM | POA: Diagnosis not present

## 2020-06-28 DIAGNOSIS — L718 Other rosacea: Secondary | ICD-10-CM | POA: Diagnosis not present

## 2020-06-28 DIAGNOSIS — L219 Seborrheic dermatitis, unspecified: Secondary | ICD-10-CM | POA: Diagnosis not present

## 2020-06-28 DIAGNOSIS — D692 Other nonthrombocytopenic purpura: Secondary | ICD-10-CM | POA: Diagnosis not present

## 2020-06-28 DIAGNOSIS — L570 Actinic keratosis: Secondary | ICD-10-CM | POA: Diagnosis not present

## 2020-06-28 DIAGNOSIS — D229 Melanocytic nevi, unspecified: Secondary | ICD-10-CM | POA: Diagnosis not present

## 2020-07-03 DIAGNOSIS — M1990 Unspecified osteoarthritis, unspecified site: Secondary | ICD-10-CM | POA: Diagnosis not present

## 2020-07-03 DIAGNOSIS — N400 Enlarged prostate without lower urinary tract symptoms: Secondary | ICD-10-CM | POA: Diagnosis not present

## 2020-07-03 DIAGNOSIS — I2510 Atherosclerotic heart disease of native coronary artery without angina pectoris: Secondary | ICD-10-CM | POA: Diagnosis not present

## 2020-07-03 DIAGNOSIS — I10 Essential (primary) hypertension: Secondary | ICD-10-CM | POA: Diagnosis not present

## 2020-07-03 DIAGNOSIS — R69 Illness, unspecified: Secondary | ICD-10-CM | POA: Diagnosis not present

## 2020-07-03 DIAGNOSIS — H35033 Hypertensive retinopathy, bilateral: Secondary | ICD-10-CM | POA: Diagnosis not present

## 2020-07-03 DIAGNOSIS — K219 Gastro-esophageal reflux disease without esophagitis: Secondary | ICD-10-CM | POA: Diagnosis not present

## 2020-07-25 DIAGNOSIS — M1990 Unspecified osteoarthritis, unspecified site: Secondary | ICD-10-CM | POA: Diagnosis not present

## 2020-07-25 DIAGNOSIS — N400 Enlarged prostate without lower urinary tract symptoms: Secondary | ICD-10-CM | POA: Diagnosis not present

## 2020-07-25 DIAGNOSIS — Z1389 Encounter for screening for other disorder: Secondary | ICD-10-CM | POA: Diagnosis not present

## 2020-07-25 DIAGNOSIS — Z8679 Personal history of other diseases of the circulatory system: Secondary | ICD-10-CM | POA: Diagnosis not present

## 2020-07-25 DIAGNOSIS — K219 Gastro-esophageal reflux disease without esophagitis: Secondary | ICD-10-CM | POA: Diagnosis not present

## 2020-07-25 DIAGNOSIS — G912 (Idiopathic) normal pressure hydrocephalus: Secondary | ICD-10-CM | POA: Diagnosis not present

## 2020-07-25 DIAGNOSIS — R7303 Prediabetes: Secondary | ICD-10-CM | POA: Diagnosis not present

## 2020-07-25 DIAGNOSIS — I10 Essential (primary) hypertension: Secondary | ICD-10-CM | POA: Diagnosis not present

## 2020-07-25 DIAGNOSIS — Z0000 Encounter for general adult medical examination without abnormal findings: Secondary | ICD-10-CM | POA: Diagnosis not present

## 2020-07-25 DIAGNOSIS — Z952 Presence of prosthetic heart valve: Secondary | ICD-10-CM | POA: Diagnosis not present

## 2020-07-25 DIAGNOSIS — I2510 Atherosclerotic heart disease of native coronary artery without angina pectoris: Secondary | ICD-10-CM | POA: Diagnosis not present

## 2020-08-02 DIAGNOSIS — M1990 Unspecified osteoarthritis, unspecified site: Secondary | ICD-10-CM | POA: Diagnosis not present

## 2020-08-02 DIAGNOSIS — I10 Essential (primary) hypertension: Secondary | ICD-10-CM | POA: Diagnosis not present

## 2020-08-02 DIAGNOSIS — I2510 Atherosclerotic heart disease of native coronary artery without angina pectoris: Secondary | ICD-10-CM | POA: Diagnosis not present

## 2020-08-02 DIAGNOSIS — H35033 Hypertensive retinopathy, bilateral: Secondary | ICD-10-CM | POA: Diagnosis not present

## 2020-08-02 DIAGNOSIS — K219 Gastro-esophageal reflux disease without esophagitis: Secondary | ICD-10-CM | POA: Diagnosis not present

## 2020-08-02 DIAGNOSIS — R69 Illness, unspecified: Secondary | ICD-10-CM | POA: Diagnosis not present

## 2020-08-02 DIAGNOSIS — N400 Enlarged prostate without lower urinary tract symptoms: Secondary | ICD-10-CM | POA: Diagnosis not present

## 2020-09-29 DIAGNOSIS — R69 Illness, unspecified: Secondary | ICD-10-CM | POA: Diagnosis not present

## 2020-09-29 DIAGNOSIS — I2510 Atherosclerotic heart disease of native coronary artery without angina pectoris: Secondary | ICD-10-CM | POA: Diagnosis not present

## 2020-09-29 DIAGNOSIS — H35033 Hypertensive retinopathy, bilateral: Secondary | ICD-10-CM | POA: Diagnosis not present

## 2020-09-29 DIAGNOSIS — K219 Gastro-esophageal reflux disease without esophagitis: Secondary | ICD-10-CM | POA: Diagnosis not present

## 2020-09-29 DIAGNOSIS — M1990 Unspecified osteoarthritis, unspecified site: Secondary | ICD-10-CM | POA: Diagnosis not present

## 2020-09-29 DIAGNOSIS — I10 Essential (primary) hypertension: Secondary | ICD-10-CM | POA: Diagnosis not present

## 2020-09-29 DIAGNOSIS — N400 Enlarged prostate without lower urinary tract symptoms: Secondary | ICD-10-CM | POA: Diagnosis not present

## 2020-10-04 DIAGNOSIS — L814 Other melanin hyperpigmentation: Secondary | ICD-10-CM | POA: Diagnosis not present

## 2020-10-04 DIAGNOSIS — L820 Inflamed seborrheic keratosis: Secondary | ICD-10-CM | POA: Diagnosis not present

## 2020-10-04 DIAGNOSIS — L719 Rosacea, unspecified: Secondary | ICD-10-CM | POA: Diagnosis not present

## 2020-10-04 DIAGNOSIS — L570 Actinic keratosis: Secondary | ICD-10-CM | POA: Diagnosis not present

## 2020-10-04 DIAGNOSIS — L821 Other seborrheic keratosis: Secondary | ICD-10-CM | POA: Diagnosis not present

## 2020-10-04 DIAGNOSIS — D1801 Hemangioma of skin and subcutaneous tissue: Secondary | ICD-10-CM | POA: Diagnosis not present

## 2020-11-27 DIAGNOSIS — H52203 Unspecified astigmatism, bilateral: Secondary | ICD-10-CM | POA: Diagnosis not present

## 2020-11-27 DIAGNOSIS — H35411 Lattice degeneration of retina, right eye: Secondary | ICD-10-CM | POA: Diagnosis not present

## 2020-11-27 DIAGNOSIS — Z961 Presence of intraocular lens: Secondary | ICD-10-CM | POA: Diagnosis not present

## 2020-12-18 DIAGNOSIS — H35371 Puckering of macula, right eye: Secondary | ICD-10-CM | POA: Diagnosis not present

## 2020-12-22 ENCOUNTER — Other Ambulatory Visit: Payer: Self-pay | Admitting: Interventional Cardiology

## 2020-12-31 ENCOUNTER — Other Ambulatory Visit: Payer: Self-pay

## 2020-12-31 ENCOUNTER — Encounter (HOSPITAL_BASED_OUTPATIENT_CLINIC_OR_DEPARTMENT_OTHER): Payer: Self-pay | Admitting: Emergency Medicine

## 2020-12-31 ENCOUNTER — Emergency Department (HOSPITAL_BASED_OUTPATIENT_CLINIC_OR_DEPARTMENT_OTHER)
Admission: EM | Admit: 2020-12-31 | Discharge: 2020-12-31 | Disposition: A | Discharge status: Home / Self Care | Payer: Medicare HMO | Attending: Emergency Medicine | Admitting: Emergency Medicine

## 2020-12-31 ENCOUNTER — Emergency Department (HOSPITAL_BASED_OUTPATIENT_CLINIC_OR_DEPARTMENT_OTHER): Payer: Medicare HMO

## 2020-12-31 DIAGNOSIS — I10 Essential (primary) hypertension: Secondary | ICD-10-CM | POA: Diagnosis not present

## 2020-12-31 DIAGNOSIS — Z7982 Long term (current) use of aspirin: Secondary | ICD-10-CM | POA: Diagnosis not present

## 2020-12-31 DIAGNOSIS — U071 COVID-19: Secondary | ICD-10-CM | POA: Insufficient documentation

## 2020-12-31 DIAGNOSIS — I517 Cardiomegaly: Secondary | ICD-10-CM | POA: Diagnosis not present

## 2020-12-31 DIAGNOSIS — Z85828 Personal history of other malignant neoplasm of skin: Secondary | ICD-10-CM | POA: Diagnosis not present

## 2020-12-31 DIAGNOSIS — I2510 Atherosclerotic heart disease of native coronary artery without angina pectoris: Secondary | ICD-10-CM | POA: Diagnosis not present

## 2020-12-31 DIAGNOSIS — Z951 Presence of aortocoronary bypass graft: Secondary | ICD-10-CM | POA: Diagnosis not present

## 2020-12-31 DIAGNOSIS — R0600 Dyspnea, unspecified: Secondary | ICD-10-CM | POA: Diagnosis present

## 2020-12-31 DIAGNOSIS — Z87891 Personal history of nicotine dependence: Secondary | ICD-10-CM | POA: Diagnosis not present

## 2020-12-31 DIAGNOSIS — R918 Other nonspecific abnormal finding of lung field: Secondary | ICD-10-CM | POA: Insufficient documentation

## 2020-12-31 DIAGNOSIS — G214 Vascular parkinsonism: Secondary | ICD-10-CM | POA: Diagnosis not present

## 2020-12-31 DIAGNOSIS — Z79899 Other long term (current) drug therapy: Secondary | ICD-10-CM | POA: Insufficient documentation

## 2020-12-31 DIAGNOSIS — R059 Cough, unspecified: Secondary | ICD-10-CM | POA: Diagnosis not present

## 2020-12-31 LAB — COMPREHENSIVE METABOLIC PANEL
ALT: 16 U/L (ref 0–44)
AST: 17 U/L (ref 15–41)
Albumin: 3.9 g/dL (ref 3.5–5.0)
Alkaline Phosphatase: 51 U/L (ref 38–126)
Anion gap: 9 (ref 5–15)
BUN: 14 mg/dL (ref 8–23)
CO2: 26 mmol/L (ref 22–32)
Calcium: 9.1 mg/dL (ref 8.9–10.3)
Chloride: 104 mmol/L (ref 98–111)
Creatinine, Ser: 1.01 mg/dL (ref 0.61–1.24)
GFR, Estimated: >60 mL/min (ref >60)
Glucose, Bld: 94 mg/dL (ref 70–99)
Potassium: 3.8 mmol/L (ref 3.5–5.1)
Sodium: 139 mmol/L (ref 135–145)
Total Bilirubin: 0.9 mg/dL (ref 0.3–1.2)
Total Protein: 6.7 g/dL (ref 6.5–8.1)

## 2020-12-31 LAB — CBC WITH DIFFERENTIAL/PLATELET
Abs Immature Granulocytes: 0.06 10*3/uL (ref 0.00–0.07)
Basophils Absolute: 0.1 10*3/uL (ref 0.0–0.1)
Basophils Relative: 1 %
Eosinophils Absolute: 0.3 10*3/uL (ref 0.0–0.5)
Eosinophils Relative: 3 %
HCT: 48.7 % (ref 39.0–52.0)
Hemoglobin: 15.9 g/dL (ref 13.0–17.0)
Immature Granulocytes: 1 %
Lymphocytes Relative: 16 %
Lymphs Abs: 1.3 10*3/uL (ref 0.7–4.0)
MCH: 29.9 pg (ref 26.0–34.0)
MCHC: 32.6 g/dL (ref 30.0–36.0)
MCV: 91.5 fL (ref 80.0–100.0)
Monocytes Absolute: 1.5 10*3/uL — ABNORMAL HIGH (ref 0.1–1.0)
Monocytes Relative: 18 %
Neutro Abs: 5.3 10*3/uL (ref 1.7–7.7)
Neutrophils Relative %: 61 %
Platelets: 197 10*3/uL (ref 150–400)
RBC: 5.32 MIL/uL (ref 4.22–5.81)
RDW: 13.5 % (ref 11.5–15.5)
WBC: 8.5 10*3/uL (ref 4.0–10.5)
nRBC: 0 % (ref 0.0–0.2)

## 2020-12-31 LAB — RESP PANEL BY RT-PCR (FLU A&B, COVID) ARPGX2
Influenza A by PCR: NEGATIVE
Influenza B by PCR: NEGATIVE
SARS Coronavirus 2 by RT PCR: POSITIVE — AB

## 2020-12-31 MED ORDER — DOXYCYCLINE HYCLATE 100 MG PO CAPS: 100.0000 mg | ORAL_CAPSULE | Freq: Two times a day (BID) | ORAL | 0 refills | Status: AC

## 2020-12-31 MED ORDER — BENZONATATE 100 MG PO CAPS: 100.0000 mg | ORAL_CAPSULE | Freq: Three times a day (TID) | ORAL | 0 refills | Status: AC

## 2020-12-31 MED ORDER — MOLNUPIRAVIR EUA 200MG CAPSULE: 4.0000 | ORAL_CAPSULE | Freq: Two times a day (BID) | ORAL | 0 refills | Status: AC

## 2020-12-31 NOTE — Discharge Instructions (Signed)
You were given a prescription for Molnupirovir. to treat your COVID.  It also looks like you may be developing a pneumonia on your chest x-ray therefore you were covered with antibiotics for this.  You are also given a prescription for Tessalon to help with your cough.  Please make sure you are staying well-hydrated at home and rotating Tylenol and Motrin for fevers and body aches.  Please follow-up with your regular doctor in the next 3 to 5 days for reassessment and return to the emergency department for any new or worsening symptoms.

## 2020-12-31 NOTE — ED Triage Notes (Signed)
Pt presents to ED POV. Pt c/o cough x2d. Pt reports that the cough is dry. Denies SOB, denies rhinorrhea. Pt concerned for covid.

## 2020-12-31 NOTE — ED Provider Notes (Signed)
[facility] EMERGENCY DEPT Provider Note   CSN: [id number] Arrival date & time: 12/31/20  1024     History Chief Complaint  Patient presents with   Cough    Joseph [name] is a 80 y.o. male.  HPI   Pt is a 80 y/o male with a h/o aortic stenosis, arthritis, BPH, depression, diverticulosis, dyspnea, GERd, heart murmur, HTN, normal rpessure hydrocephalus, preDM who presents to the ED today for eval of a cough that started 2 days ago. He denies associated fevers, chills, sore throat, congestion, nausea, vomiting, diarrhea, chest pain, or sob. He has tried no interventions for his symptoms except cough syrup which mildly improved his symptoms.   He denies sick contacts.   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[place])    03-03-14 had VP shunt inserted   Pre-diabetes    Rupture of left quadriceps tendon 07/21/2014   Skin cancer, basal cell    Urinary incontinence     Patient Active Problem List   Diagnosis Date Noted   CAD (coronary artery disease) 11/29/2016   S/P AVR 09/26/2016   Atrial fibrillation ([place]) 09/26/2016   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   Rupture of left quadriceps tendon 07/21/2014   Abnormal nuclear stress test 07/20/2014   Morbid obesity ([place]) 07/20/2014   Aortic stenosis 07/20/2014   S/P ventriculoperitoneal shunt 03/03/2014   Pre-operative cardiovascular examination, valvular heart disease 02/22/2014   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   HTN (hypertension) 11/18/2013    Past Surgical History:  Procedure Laterality Date   ANAL FISSURE REPAIR     AORTIC VALVE REPLACEMENT N/A 09/20/2016   Procedure: AORTIC  VALVE REPLACEMENT (AVR) USING MAGNA EASE 23 MM PERICARDIAL TISSUE HEART VALVE MODEL 3300TFX;  Surgeon: Gaye Pollack, MD;  Location: [facility];  Service: Open Heart Surgery;  Laterality: N/A;   BACK SURGERY     CARDIAC CATHETERIZATION     COLONOSCOPY W/ POLYPECTOMY     CORONARY ARTERY BYPASS GRAFT N/A 09/20/2016   Procedure: CORONARY ARTERY BYPASS GRAFTING (CABG)x 3 WITH ENDOSCOPIC HARVESTING OF RIGHT SAPHENOUS VEIN;  Surgeon: Gaye Pollack, MD;  Location: [facility];  Service: Open Heart Surgery;  Laterality: N/A;   ELBOW SURGERY Right    FINGER SURGERY     left shoulder RTC tear     LEG SURGERY     tendon repair right leg   MICRODISCECTOMY LUMBAR     l5-s1   QUADRICEPS TENDON REPAIR Left 07/21/2014   Procedure: REPAIR LEFT QUADRICEP TENDON;  Surgeon: Marchia Bond, MD;  Location: [facility];  Service: Orthopedics;  Laterality: Left;   RIGHT/LEFT HEART CATH AND CORONARY ANGIOGRAPHY N/A 09/03/2016   Procedure: RIGHT/LEFT HEART CATH AND CORONARY ANGIOGRAPHY;  Surgeon: Jettie Booze, MD;  Location: [facility];  Service: Cardiovascular;  Laterality: N/A;   TEE WITHOUT CARDIOVERSION N/A 09/20/2016   Procedure: TRANSESOPHAGEAL ECHOCARDIOGRAM (TEE);  Surgeon: Gaye Pollack, MD;  Location: [facility];  Service: Open Heart Surgery;  Laterality: N/A;   TONSILLECTOMY     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   Procedure: SHUNT  INSERTION VENTRICULAR-PERITONEAL;  Surgeon: Eustace Moore, MD;  Location: [facility];  Service: Neurosurgery;  Laterality: Right;       Family History  Problem Relation Age of Onset   Heart disease Mother    Heart attack Maternal Grandmother    Hypertension Neg Hx    Stroke Neg Hx     Social History   Tobacco Use   Smoking status: Former    Types: Cigarettes, Cigars, Pipe   Smokeless tobacco: Never   Tobacco comments:    Quit in the 80's  Vaping Use   Vaping Use: Never used  Substance Use Topics   Alcohol use: Yes    Alcohol/week:  2.0 - 3.0 standard drinks    Types: 2 - 3 Glasses of wine per week    Comment: occasional 1-2 times week   Drug use: No    Home Medications Prior to Admission medications   Medication Sig Start Date End Date Taking? Authorizing Provider  benzonatate (TESSALON) 100 MG capsule Take 1 capsule (100 mg total) by mouth every 8 (eight) hours for 5 days. 12/31/20 01/05/21 Yes Tanijah Morais S, PA-C  doxycycline (VIBRAMYCIN) 100 MG capsule Take 1 capsule (100 mg total) by mouth 2 (two) times daily for 7 days. 12/31/20 01/07/21 Yes Bowden Boody S, PA-C  molnupiravir EUA (LAGEVRIO) 200 mg CAPS capsule Take 4 capsules (800 mg total) by mouth 2 (two) times daily for 5 days. 12/31/20 01/05/21 Yes Yitzhak Awan S, PA-C  acetaminophen (TYLENOL) 500 MG tablet Take 500-1,000 mg by mouth 4 (four) times daily as needed for moderate pain.     [provider]  ammonium lactate (AMLACTIN) 12 % cream Apply topically as needed. 09/09/18   [provider]  amoxicillin (AMOXIL) 500 MG capsule Take 500 mg by mouth See admin instructions. Take 4 capsules 1 hour prior to dental procedures    [provider]  aspirin EC 81 MG tablet Take 81 mg by mouth daily.     [provider]  atorvastatin (LIPITOR) 10 MG tablet Take 1 tablet (10 mg total) by mouth every evening. Please make overdue appt with Dr. Irish Lack before anymore refills. Thank you 1st attempt 12/22/20   Jettie Booze, MD  B Complex Vitamins (B-COMPLEX HIGH POTENCY PO) Take 1 tablet by mouth daily.     [provider]  clindamycin (CLEOCIN T) 1 % external solution Apply topically. 12/08/19   [provider]  diltiazem (CARDIZEM CD) 240 MG 24 hr capsule Take 1 capsule (240 mg total) by mouth every morning. Please make overdue appt with Dr. Irish Lack before anymore refills. Thank you 1st attempt 12/22/20   Jettie Booze, MD  docusate sodium (COLACE) 100 MG capsule Take 100 mg by mouth daily.      [provider]  econazole nitrate 1 % cream Apply topically. 12/10/19   [provider]  hydrALAZINE (APRESOLINE) 25 MG tablet TAKE ONE TABLET BY MOUTH EVERY MORNING and TAKE ONE TABLET BY MOUTH AT NOON and TAKE ONE TABLET BY MOUTH EVERYDAY AT BEDTIME 12/22/20   Jettie Booze, MD  ketoconazole (NIZORAL) 2 % shampoo Apply 1 application topically 2 (two) times a week. 12/11/18   [provider]  lansoprazole (PREVACID) 30 MG capsule Take 30 mg by mouth 2 (two) times daily.     [provider]  losartan (COZAAR) 100 MG tablet Take 100 mg by mouth daily.    [provider]  metroNIDAZOLE (METROCREAM) 0.75 % cream Apply  1 application topically 2 (two) times daily. 12/08/19   [provider]  mirabegron ER (MYRBETRIQ) 50 MG TB24 tablet Take 50 mg by mouth daily.     [provider]  nabumetone (RELAFEN) 750 MG tablet Take 1,500 mg by mouth daily.  06/09/15   [provider]  psyllium (REGULOID) 0.52 g capsule Take 1.56 g by mouth daily.    [provider]  tolterodine (DETROL LA) 2 MG 24 hr capsule Take 2 mg by mouth every morning. 12/21/18   [provider]    Allergies    Ace inhibitors, Atenolol, Beta adrenergic blockers, Erythromycin, and Ciprofloxacin  Review of Systems   Review of Systems  Constitutional:  Negative for chills and fever.  HENT:  Negative for ear pain and sore throat.   Eyes:  Negative for pain and visual disturbance.  Respiratory:  Positive for cough. Negative for shortness of breath.   Cardiovascular:  Negative for chest pain.  Gastrointestinal:  Negative for abdominal pain, constipation, diarrhea, nausea and vomiting.  Genitourinary:  Negative for dysuria and hematuria.  Musculoskeletal:  Negative for arthralgias and back pain.  Skin:  Negative for rash.  Neurological:  Negative for seizures and syncope.  All other systems reviewed and are negative.  Physical Exam Updated  Vital Signs BP 108/77 (BP Location: Left Arm)   Pulse 75   Temp 98.7 F (37.1 C)   Resp 18   Ht 5\' 9"  (1.753 m)   Wt 115.7 kg   SpO2 95%   BMI 37.66 kg/m   Physical Exam Vitals and nursing note reviewed.  Constitutional:      General: He is not in acute distress.    Appearance: He is well-developed.  HENT:     Head: Normocephalic and atraumatic.  Eyes:     Conjunctiva/sclera: Conjunctivae normal.  Cardiovascular:     Rate and Rhythm: Normal rate and regular rhythm.     Heart sounds: Normal heart sounds. No murmur heard. Pulmonary:     Effort: Pulmonary effort is normal. No respiratory distress.     Breath sounds: Rales (LLL) present.  Abdominal:     General: Bowel sounds are normal.     Palpations: Abdomen is soft.     Tenderness: There is no abdominal tenderness.  Musculoskeletal:        General: No swelling.     Cervical back: Neck supple.  Skin:    General: Skin is warm and dry.     Capillary Refill: Capillary refill takes less than 2 seconds.  Neurological:     Mental Status: He is alert.  Psychiatric:        Mood and Affect: Mood normal.    ED Results / Procedures / Treatments   Labs (all labs ordered are listed, but only abnormal results are displayed) Labs Reviewed  RESP PANEL BY RT-PCR (FLU A&B, COVID) ARPGX2 - Abnormal; Notable for the following components:      Result Value   SARS Coronavirus 2 by RT PCR POSITIVE (*)    All other components within normal limits  CBC WITH DIFFERENTIAL/PLATELET - Abnormal; Notable for the following components:   Monocytes Absolute 1.5 (*)    All other components within normal limits  COMPREHENSIVE METABOLIC PANEL    EKG None  Radiology DG Chest Portable 1 View  Result Date: 12/31/2020 CLINICAL DATA:  Cough, COVID-19 positive EXAM: PORTABLE CHEST 1 VIEW COMPARISON:  10/30/2016 FINDINGS: Stable cardiomegaly status post median sternotomy. Right-sided shunt catheter is again seen.  Aortic atherosclerosis. Coarsened  interstitial markings bilaterally. Minimal streaky bibasilar opacities. No pleural effusion or pneumothorax. IMPRESSION: Minimal streaky bibasilar opacities may represent atelectasis or developing infiltrate. Electronically Signed   By: Davina Poke D.O.   On: 12/31/2020 13:44    Procedures Procedures   Medications Ordered in ED Medications - No data to display  ED Course  I have reviewed the triage vital signs and the nursing notes.  Pertinent labs & imaging results that were available during my care of the patient were reviewed by me and considered in my medical decision making (see chart for details).    MDM Rules/Calculators/A&P                          81 year old male presents emergency department today for evaluation of 2-day history of cough.  Denies any fevers, chest pain or shortness of breath.  His vital signs are reassuring here in the ED and he was ambulatory with sats above 92% on room air with ambulation.  His COVID test is positive today.  His chest x-ray was personally reviewed/interpreted and does show a developing infiltrate so we will also cover him with antibiotics.  His laboratory work is reassuring.  At this time appears to be a better candidate for more new.  Very given multiple drug interactions with pack Slo-Bid.  Rx given for this as well as cough medication.  Patient and wife at bedside are comfortable with the plan.  Advised on plan for follow-up and strict return precautions.  They voiced understanding and are in agreement.  All questions answered.  Patient stable for discharge.   Final Clinical Impression(s) / ED Diagnoses Final diagnoses:  COVID  Bilateral pulmonary infiltrates on chest x-ray    Rx / DC Orders ED Discharge Orders          Ordered    molnupiravir EUA (LAGEVRIO) 200 mg CAPS capsule  2 times daily        12/31/20 1451    benzonatate (TESSALON) 100 MG capsule  Every 8 hours        12/31/20 1451    doxycycline (VIBRAMYCIN) 100 MG  capsule  2 times daily        12/31/20 [address] [place], PA-C 12/31/20 1452    Wyvonnia Dusky, MD 12/31/20 1511

## 2020-12-31 NOTE — ED Notes (Signed)
Patient's wife asking if this RN had the right patient to get blood work, Therapist, sports explained that yes she was sure and that if the patient were to need medication there are certain medications that require kidney function levels be recent. RN explained that she cannot go over results with patients but that the patient did need blood work and that the Chest x-ray was ordered to make sure no pneumonia was present.

## 2020-12-31 NOTE — ED Notes (Signed)
PA at bedside.

## 2020-12-31 NOTE — ED Notes (Signed)
Patient ambulatory back to room O2 92-94% on RA during ambulation with HR between 106-112 during ambulation, 97 at rest following ambulation. Patient reports being unhappy with the wait time, RN explained that often times wait times are increased so we are doing well with the time right now. Patient's wife asked if they should have gone to a drug store, RN explained that the drug store test would not have come back as quickly. Patient is alert and oriented and in no active distress at this time.

## 2021-01-01 DIAGNOSIS — R69 Illness, unspecified: Secondary | ICD-10-CM | POA: Diagnosis not present

## 2021-01-01 DIAGNOSIS — H35033 Hypertensive retinopathy, bilateral: Secondary | ICD-10-CM | POA: Diagnosis not present

## 2021-01-01 DIAGNOSIS — I2510 Atherosclerotic heart disease of native coronary artery without angina pectoris: Secondary | ICD-10-CM | POA: Diagnosis not present

## 2021-01-01 DIAGNOSIS — N400 Enlarged prostate without lower urinary tract symptoms: Secondary | ICD-10-CM | POA: Diagnosis not present

## 2021-01-01 DIAGNOSIS — I10 Essential (primary) hypertension: Secondary | ICD-10-CM | POA: Diagnosis not present

## 2021-01-01 DIAGNOSIS — K219 Gastro-esophageal reflux disease without esophagitis: Secondary | ICD-10-CM | POA: Diagnosis not present

## 2021-01-01 DIAGNOSIS — M1990 Unspecified osteoarthritis, unspecified site: Secondary | ICD-10-CM | POA: Diagnosis not present

## 2021-01-08 DIAGNOSIS — G912 (Idiopathic) normal pressure hydrocephalus: Secondary | ICD-10-CM | POA: Diagnosis not present

## 2021-01-08 DIAGNOSIS — N400 Enlarged prostate without lower urinary tract symptoms: Secondary | ICD-10-CM | POA: Diagnosis not present

## 2021-01-08 DIAGNOSIS — I2510 Atherosclerotic heart disease of native coronary artery without angina pectoris: Secondary | ICD-10-CM | POA: Diagnosis not present

## 2021-01-08 DIAGNOSIS — K219 Gastro-esophageal reflux disease without esophagitis: Secondary | ICD-10-CM | POA: Diagnosis not present

## 2021-01-08 DIAGNOSIS — Z8679 Personal history of other diseases of the circulatory system: Secondary | ICD-10-CM | POA: Diagnosis not present

## 2021-01-08 DIAGNOSIS — R7303 Prediabetes: Secondary | ICD-10-CM | POA: Diagnosis not present

## 2021-01-08 DIAGNOSIS — I10 Essential (primary) hypertension: Secondary | ICD-10-CM | POA: Diagnosis not present

## 2021-01-08 DIAGNOSIS — U071 COVID-19: Secondary | ICD-10-CM | POA: Diagnosis not present

## 2021-01-08 DIAGNOSIS — M1990 Unspecified osteoarthritis, unspecified site: Secondary | ICD-10-CM | POA: Diagnosis not present

## 2021-01-08 DIAGNOSIS — Z952 Presence of prosthetic heart valve: Secondary | ICD-10-CM | POA: Diagnosis not present

## 2021-02-26 NOTE — Progress Notes (Signed)
Cardiology Office Note    Date:  03/06/2021   ID:  Joseph [name], DOB [birth year], MRN [id number]   PCP:  Gaynelle Arabian, MD   [place]  Cardiologist:  Larae Grooms, MD   Advanced Practice Provider:  No care team member to display Electrophysiologist:  None   [phone]   No chief complaint on file.   History of Present Illness:  Joseph [name] is a 81 y.o. 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  Amiodarone stopped 11/2016 Coumadin stopped 01/2017.  He was maintaining normal sinus rhythm and blood pressure was well controlled at that time.  SBE prophylaxis recommended.   Patient last saw Dr. Irish Lack 01/06/2020 and was doing well.  No changes made.  Patient comes in for f/u. Had [place] pneumonia 12/2020 treated wit molnupiravir. Denies chest pain, dyspnea, palpitations, edema.     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[place])    03-03-14 had VP shunt inserted   Pre-diabetes    Rupture of left quadriceps tendon 07/21/2014   Skin cancer, basal cell    Urinary incontinence     Past Surgical History:  Procedure Laterality Date   ANAL FISSURE REPAIR     AORTIC VALVE REPLACEMENT N/A 09/20/2016   Procedure: AORTIC VALVE REPLACEMENT (AVR) USING MAGNA EASE 23 MM PERICARDIAL TISSUE HEART VALVE MODEL 3300TFX;  Surgeon: Gaye Pollack, MD;  Location: [facility];  Service: Open Heart Surgery;  Laterality: N/A;   BACK SURGERY     CARDIAC CATHETERIZATION     COLONOSCOPY W/ POLYPECTOMY     CORONARY ARTERY  BYPASS GRAFT N/A 09/20/2016   Procedure: CORONARY ARTERY BYPASS GRAFTING (CABG)x 3 WITH ENDOSCOPIC HARVESTING OF RIGHT SAPHENOUS VEIN;  Surgeon: Gaye Pollack, MD;  Location: [facility];  Service: Open Heart Surgery;  Laterality: N/A;   ELBOW SURGERY Right    FINGER SURGERY     left shoulder RTC tear     LEG SURGERY     tendon repair right leg   MICRODISCECTOMY LUMBAR     l5-s1   QUADRICEPS TENDON REPAIR Left 07/21/2014   Procedure: REPAIR LEFT QUADRICEP TENDON;  Surgeon: Marchia Bond, MD;  Location: [facility];  Service: Orthopedics;  Laterality: Left;   RIGHT/LEFT HEART CATH AND CORONARY ANGIOGRAPHY N/A 09/03/2016   Procedure: RIGHT/LEFT HEART CATH AND CORONARY ANGIOGRAPHY;  Surgeon: Jettie Booze, MD;  Location: [facility];  Service: Cardiovascular;  Laterality: N/A;   TEE WITHOUT CARDIOVERSION N/A 09/20/2016   Procedure: TRANSESOPHAGEAL ECHOCARDIOGRAM (TEE);  Surgeon: Gaye Pollack, MD;  Location: [facility];  Service: Open Heart Surgery;  Laterality: N/A;   TONSILLECTOMY     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   Procedure: SHUNT INSERTION VENTRICULAR-PERITONEAL;  Surgeon: Eustace Moore, MD;  Location: [facility];  Service: Neurosurgery;  Laterality: Right;    Current Medications: Current Meds  Medication Sig   acetaminophen (TYLENOL) 500 MG tablet Take 500-1,000 mg by mouth 4 (four) times daily  as needed for moderate pain.    ammonium lactate (AMLACTIN) 12 % cream Apply topically as needed.   amoxicillin (AMOXIL) 500 MG capsule Take 500 mg by mouth See admin instructions. Take 4 capsules 1 hour prior to dental procedures   aspirin EC 81 MG tablet Take 81 mg by mouth daily.    atorvastatin (LIPITOR) 10 MG tablet Take 1 tablet (10 mg total) by mouth every evening. Please make overdue appt with Dr. Irish Lack before anymore refills. Thank you 1st attempt   chlorhexidine (PERIDEX) 0.12 % solution SMARTSIG:By Mouth   diltiazem (CARDIZEM CD) 240 MG 24 hr  capsule Take 1 capsule (240 mg total) by mouth every morning. Please make overdue appt with Dr. Irish Lack before anymore refills. Thank you 1st attempt   docusate sodium (COLACE) 100 MG capsule Take 100 mg by mouth daily.    econazole nitrate 1 % cream Apply topically.   GEMTESA 75 MG TABS Take 1 tablet by mouth every morning.   hydrALAZINE (APRESOLINE) 25 MG tablet TAKE ONE TABLET BY MOUTH EVERY MORNING and TAKE ONE TABLET BY MOUTH AT NOON and TAKE ONE TABLET BY MOUTH EVERYDAY AT BEDTIME   ketoconazole (NIZORAL) 2 % shampoo Apply 1 application topically 2 (two) times a week.   lansoprazole (PREVACID) 30 MG capsule Take 30 mg by mouth 2 (two) times daily.    metroNIDAZOLE (METROCREAM) 0.75 % cream Apply 1 application topically 2 (two) times daily.   mirabegron ER (MYRBETRIQ) 50 MG TB24 tablet Take 50 mg by mouth daily.    nabumetone (RELAFEN) 750 MG tablet Take 1,500 mg by mouth daily.    psyllium (REGULOID) 0.52 g capsule Take 1.56 g by mouth daily.   tolterodine (DETROL LA) 2 MG 24 hr capsule Take 2 mg by mouth every morning.     Allergies:   Ace inhibitors, Atenolol, Beta adrenergic blockers, Erythromycin, and Ciprofloxacin   Social History   Socioeconomic History   Marital status: Married    Spouse name: Not on file   Number of children: Not on file   Years of education: Not on file   Highest education level: Not on file  Occupational History   Not on file  Tobacco Use   Smoking status: Former    Types: Cigarettes, Cigars, Pipe   Smokeless tobacco: Never   Tobacco comments:    Quit in the 80's  Vaping Use   Vaping Use: Never used  Substance and Sexual Activity   Alcohol use: Yes    Alcohol/week: 2.0 - 3.0 standard drinks    Types: 2 - 3 Glasses of wine per week    Comment: occasional 1-2 times week   Drug use: No   Sexual activity: Not Currently  Other Topics Concern   Not on file  Social History Narrative   Not on file   Social Determinants of Health   Financial  Resource Strain: Not on file  Food Insecurity: Not on file  Transportation Needs: Not on file  Physical Activity: Not on file  Stress: Not on file  Social Connections: Not on file     Family History:  The patient's  family history includes Heart attack in his maternal grandmother; Heart disease in his mother.   ROS:   Please see the history of present illness.    ROS All other systems reviewed and are negative.   PHYSICAL EXAM:   VS:  BP 126/74 (BP Location: Left Arm, Patient Position: Sitting, Cuff Size: Large)    Pulse 81  Ht 5\' 9"  (1.753 m)    Wt 248 lb 12.8 oz (112.9 kg)    SpO2 95%    BMI 36.74 kg/m   Physical Exam  GEN: Well nourished, well developed, in no acute distress  Neck: no JVD, carotid bruits, or masses Cardiac:RRR; no murmurs, rubs, or gallops  Respiratory:  clear to auscultation bilaterally, normal work of breathing GI: soft, nontender, nondistended, + BS Ext: without cyanosis, clubbing, or edema, Good distal pulses bilaterally Neuro:  Alert and Oriented x 3  Psych: euthymic mood, full affect  Wt Readings from Last 3 Encounters:  03/06/21 248 lb 12.8 oz (112.9 kg)  12/31/20 255 lb (115.7 kg)  01/06/20 260 lb 12.8 oz (118.3 kg)      Studies/Labs Reviewed:   EKG:  EKG is  ordered today.  The ekg ordered today demonstrates NSR nonspecific IVCD  Recent Labs: 12/31/2020: ALT 16; BUN 14; Creatinine, Ser 1.01; Hemoglobin 15.9; Platelets 197; Potassium 3.8; Sodium 139   Lipid Panel    Component Value Date/Time   CHOL 130 01/31/2017 0855   TRIG 110 01/31/2017 0855   HDL 62 01/31/2017 0855   CHOLHDL 2.1 01/31/2017 0855   LDLCALC 46 01/31/2017 0855    Additional studies/ records that were reviewed today include:  TEE 09/20/16 Left ventricle: Normal cavity size, wall thickness, left ventricular  diastolic function and left atrial pressure. LV systolic function is  mildly reduced with an EF of 45-50%. There are no obvious wall motion  abnormalities.    Aortic valve: The valve is trileaflet. Severe valve thickening present.  Severe valve calcification present. Severely decreased leaflet separation.  Severe stenosis. Mild to moderate regurgitation.   Mitral valve: Mild regurgitation.   Right ventricle: Normal cavity size, wall thickness and ejection  fraction.   Tricuspid valve: Mild regurgitation.    Echo 08/12/16 Study Conclusions   - Left ventricle: The cavity size was normal. Wall thickness was    normal. Systolic function was normal. The estimated ejection    fraction was in the range of 55% to 60%. Wall motion was normal;    there were no regional wall motion abnormalities. There was    fusion of early and atrial contributions to ventricular filling.  - Aortic valve: Valve mobility was severely restricted. There was    severe stenosis. Mean gradient (S): 46 mm Hg. Peak gradient (S):    76 mm Hg. Valve area (VTI): 0.88 cm^2. Valve area (Vmax): 0.82    cm^2. Valve area (Vmean): 0.81 cm^2.   -------------------------------------------------------------------    Risk Assessment/Calculations:         ASSESSMENT:    1. S/P CABG (coronary artery bypass graft)   2. S/P AVR   3. Essential hypertension   4. Mixed hyperlipidemia   5. Morbid obesity ([place])      PLAN:  In order of problems listed above:  CAD status post CABG and AVR 09/20/2016 with postop atrial fibrillation converted to normal sinus rhythm-no chest pain. 150 min exercise weekly, echo for murmur. Labs 12/2020 normal.  Hypertension well controlled on diltiazem and hydralazine. No longer on losartan.  Hyperlipidemia LDL 56 12/2020 on lipitor  Morbid obesity-has lost 10 lbs in past year. Continued weight loss recommended.  Shared Decision Making/Informed Consent        Medication Adjustments/Labs and Tests Ordered: Current medicines are reviewed at length with the patient today.  Concerns regarding medicines are outlined above.  Medication changes, Labs  and Tests ordered today are listed in the  Patient Instructions below. Patient Instructions  Medication Instructions:  Your physician recommends that you continue on your current medications as directed. Please refer to the Current Medication list given to you today.  *If you need a refill on your cardiac medications before your next appointment, please call your pharmacy*   Lab Work: None ordered  If you have labs (blood work) drawn today and your tests are completely normal, you will receive your results only by: [place] (if you have MyChart) OR A paper copy in the mail If you have any lab test that is abnormal or we need to change your treatment, we will call you to review the results.   Testing/Procedures: Your physician has requested that you have an echocardiogram. Echocardiography is a painless test that uses sound waves to create images of your heart. It provides your doctor with information about the size and shape of your heart and how well your hearts chambers and valves are working. This procedure takes approximately one hour. There are no restrictions for this procedure.    Follow-Up: At [facility], you and your health needs are our priority.  As part of our continuing mission to provide you with exceptional heart care, we have created designated Provider Care Teams.  These Care Teams include your primary Cardiologist (physician) and Advanced Practice Providers (APPs -  Physician Assistants and Nurse Practitioners) who all work together to provide you with the care you need, when you need it.  We recommend signing up for the patient portal called "MyChart".  Sign up information is provided on this After Visit Summary.  MyChart is used to connect with patients for Virtual Visits (Telemedicine).  Patients are able to view lab/test results, encounter notes, upcoming appointments, etc.  Non-urgent messages can be sent to your provider as well.   To learn more about what  you can do with MyChart, go to [email or web address].    Your next appointment:   12 month(s)  The format for your next appointment:   In Person  Provider:   Larae Grooms, MD     Other Instructions    Signed, Ermalinda Barrios, PA-C  03/06/2021 1:05 PM    [facility] [place], [place], [place]  [address] Phone: [phone]; Fax: [phone]

## 2021-03-06 ENCOUNTER — Other Ambulatory Visit: Payer: Self-pay

## 2021-03-06 ENCOUNTER — Encounter: Payer: Self-pay | Admitting: Physician Assistant

## 2021-03-06 ENCOUNTER — Ambulatory Visit: Payer: Medicare HMO | Admitting: Physician Assistant

## 2021-03-06 VITALS — BP 126/74 | HR 81 | Ht 69.0 in | Wt 248.8 lb

## 2021-03-06 DIAGNOSIS — Z952 Presence of prosthetic heart valve: Secondary | ICD-10-CM | POA: Diagnosis not present

## 2021-03-06 DIAGNOSIS — I10 Essential (primary) hypertension: Secondary | ICD-10-CM

## 2021-03-06 DIAGNOSIS — E782 Mixed hyperlipidemia: Secondary | ICD-10-CM

## 2021-03-06 DIAGNOSIS — Z951 Presence of aortocoronary bypass graft: Secondary | ICD-10-CM | POA: Diagnosis not present

## 2021-03-06 NOTE — Patient Instructions (Addendum)
Medication Instructions:  Your physician recommends that you continue on your current medications as directed. Please refer to the Current Medication list given to you today.  *If you need a refill on your cardiac medications before your next appointment, please call your pharmacy*   Lab Work: None ordered  If you have labs (blood work) drawn today and your tests are completely normal, you will receive your results only by: [place] (if you have MyChart) OR A paper copy in the mail If you have any lab test that is abnormal or we need to change your treatment, we will call you to review the results.   Testing/Procedures: Your physician has requested that you have an echocardiogram. Echocardiography is a painless test that uses sound waves to create images of your heart. It provides your doctor with information about the size and shape of your heart and how well your hearts chambers and valves are working. This procedure takes approximately one hour. There are no restrictions for this procedure.    Follow-Up: At [facility], you and your health needs are our priority.  As part of our continuing mission to provide you with exceptional heart care, we have created designated Provider Care Teams.  These Care Teams include your primary Cardiologist (physician) and Advanced Practice Providers (APPs -  Physician Assistants and Nurse Practitioners) who all work together to provide you with the care you need, when you need it.  We recommend signing up for the patient portal called "MyChart".  Sign up information is provided on this After Visit Summary.  MyChart is used to connect with patients for Virtual Visits (Telemedicine).  Patients are able to view lab/test results, encounter notes, upcoming appointments, etc.  Non-urgent messages can be sent to your provider as well.   To learn more about what you can do with MyChart, go to [email or web address].    Your next appointment:   12  month(s)  The format for your next appointment:   In Person  Provider:   Larae Grooms, MD     Other Instructions

## 2021-03-15 ENCOUNTER — Other Ambulatory Visit: Payer: Self-pay

## 2021-03-15 ENCOUNTER — Ambulatory Visit (HOSPITAL_COMMUNITY): Payer: Medicare HMO | Attending: Cardiology

## 2021-03-15 DIAGNOSIS — Z951 Presence of aortocoronary bypass graft: Secondary | ICD-10-CM | POA: Insufficient documentation

## 2021-03-15 DIAGNOSIS — Z952 Presence of prosthetic heart valve: Secondary | ICD-10-CM | POA: Diagnosis not present

## 2021-03-15 LAB — ECHOCARDIOGRAM COMPLETE
AR max vel: 0.93 cm2
AV Area VTI: 1.13 cm2
AV Area mean vel: 0.92 cm2
AV Mean grad: 15 mmHg
AV Peak grad: 25.2 mmHg
Ao pk vel: 2.51 m/s
Area-P 1/2: 3.27 cm2
S' Lateral: 4.5 cm

## 2021-03-23 ENCOUNTER — Other Ambulatory Visit: Payer: Self-pay | Admitting: Interventional Cardiology

## 2021-03-27 DIAGNOSIS — N3941 Urge incontinence: Secondary | ICD-10-CM | POA: Diagnosis not present

## 2021-03-27 DIAGNOSIS — N3281 Overactive bladder: Secondary | ICD-10-CM | POA: Diagnosis not present

## 2021-04-04 DIAGNOSIS — Z86010 Personal history of colonic polyps: Secondary | ICD-10-CM | POA: Diagnosis not present

## 2021-04-04 DIAGNOSIS — Z09 Encounter for follow-up examination after completed treatment for conditions other than malignant neoplasm: Secondary | ICD-10-CM | POA: Diagnosis not present

## 2021-04-04 DIAGNOSIS — K5730 Diverticulosis of large intestine without perforation or abscess without bleeding: Secondary | ICD-10-CM | POA: Diagnosis not present

## 2021-04-04 DIAGNOSIS — D123 Benign neoplasm of transverse colon: Secondary | ICD-10-CM | POA: Diagnosis not present

## 2021-04-06 DIAGNOSIS — D123 Benign neoplasm of transverse colon: Secondary | ICD-10-CM | POA: Diagnosis not present

## 2021-05-14 DIAGNOSIS — H0012 Chalazion right lower eyelid: Secondary | ICD-10-CM | POA: Diagnosis not present

## 2021-06-01 DIAGNOSIS — I2510 Atherosclerotic heart disease of native coronary artery without angina pectoris: Secondary | ICD-10-CM | POA: Diagnosis not present

## 2021-06-01 DIAGNOSIS — M1990 Unspecified osteoarthritis, unspecified site: Secondary | ICD-10-CM | POA: Diagnosis not present

## 2021-06-01 DIAGNOSIS — R69 Illness, unspecified: Secondary | ICD-10-CM | POA: Diagnosis not present

## 2021-06-01 DIAGNOSIS — K219 Gastro-esophageal reflux disease without esophagitis: Secondary | ICD-10-CM | POA: Diagnosis not present

## 2021-06-01 DIAGNOSIS — N400 Enlarged prostate without lower urinary tract symptoms: Secondary | ICD-10-CM | POA: Diagnosis not present

## 2021-06-01 DIAGNOSIS — I10 Essential (primary) hypertension: Secondary | ICD-10-CM | POA: Diagnosis not present

## 2021-06-26 DIAGNOSIS — M1990 Unspecified osteoarthritis, unspecified site: Secondary | ICD-10-CM | POA: Diagnosis not present

## 2021-06-26 DIAGNOSIS — I2510 Atherosclerotic heart disease of native coronary artery without angina pectoris: Secondary | ICD-10-CM | POA: Diagnosis not present

## 2021-06-26 DIAGNOSIS — I10 Essential (primary) hypertension: Secondary | ICD-10-CM | POA: Diagnosis not present

## 2021-06-26 DIAGNOSIS — H0012 Chalazion right lower eyelid: Secondary | ICD-10-CM | POA: Diagnosis not present

## 2021-06-26 DIAGNOSIS — R69 Illness, unspecified: Secondary | ICD-10-CM | POA: Diagnosis not present

## 2021-06-26 DIAGNOSIS — N400 Enlarged prostate without lower urinary tract symptoms: Secondary | ICD-10-CM | POA: Diagnosis not present

## 2021-06-26 DIAGNOSIS — K219 Gastro-esophageal reflux disease without esophagitis: Secondary | ICD-10-CM | POA: Diagnosis not present

## 2021-08-06 DIAGNOSIS — H35033 Hypertensive retinopathy, bilateral: Secondary | ICD-10-CM | POA: Diagnosis not present

## 2021-08-06 DIAGNOSIS — I2510 Atherosclerotic heart disease of native coronary artery without angina pectoris: Secondary | ICD-10-CM | POA: Diagnosis not present

## 2021-08-06 DIAGNOSIS — Z952 Presence of prosthetic heart valve: Secondary | ICD-10-CM | POA: Diagnosis not present

## 2021-08-06 DIAGNOSIS — G912 (Idiopathic) normal pressure hydrocephalus: Secondary | ICD-10-CM | POA: Diagnosis not present

## 2021-08-06 DIAGNOSIS — R7303 Prediabetes: Secondary | ICD-10-CM | POA: Diagnosis not present

## 2021-08-06 DIAGNOSIS — Z1331 Encounter for screening for depression: Secondary | ICD-10-CM | POA: Diagnosis not present

## 2021-08-06 DIAGNOSIS — K219 Gastro-esophageal reflux disease without esophagitis: Secondary | ICD-10-CM | POA: Diagnosis not present

## 2021-08-06 DIAGNOSIS — M1990 Unspecified osteoarthritis, unspecified site: Secondary | ICD-10-CM | POA: Diagnosis not present

## 2021-08-06 DIAGNOSIS — Z0000 Encounter for general adult medical examination without abnormal findings: Secondary | ICD-10-CM | POA: Diagnosis not present

## 2021-08-06 DIAGNOSIS — I10 Essential (primary) hypertension: Secondary | ICD-10-CM | POA: Diagnosis not present

## 2021-08-06 DIAGNOSIS — N400 Enlarged prostate without lower urinary tract symptoms: Secondary | ICD-10-CM | POA: Diagnosis not present

## 2021-08-22 DIAGNOSIS — R2681 Unsteadiness on feet: Secondary | ICD-10-CM | POA: Diagnosis not present

## 2021-08-22 DIAGNOSIS — G912 (Idiopathic) normal pressure hydrocephalus: Secondary | ICD-10-CM | POA: Diagnosis not present

## 2021-08-22 DIAGNOSIS — R278 Other lack of coordination: Secondary | ICD-10-CM | POA: Diagnosis not present

## 2021-08-22 DIAGNOSIS — M62571 Muscle wasting and atrophy, not elsewhere classified, right ankle and foot: Secondary | ICD-10-CM | POA: Diagnosis not present

## 2021-08-22 DIAGNOSIS — R2689 Other abnormalities of gait and mobility: Secondary | ICD-10-CM | POA: Diagnosis not present

## 2021-08-22 DIAGNOSIS — Z9181 History of falling: Secondary | ICD-10-CM | POA: Diagnosis not present

## 2021-08-24 DIAGNOSIS — Z9181 History of falling: Secondary | ICD-10-CM | POA: Diagnosis not present

## 2021-08-24 DIAGNOSIS — M62571 Muscle wasting and atrophy, not elsewhere classified, right ankle and foot: Secondary | ICD-10-CM | POA: Diagnosis not present

## 2021-08-24 DIAGNOSIS — G912 (Idiopathic) normal pressure hydrocephalus: Secondary | ICD-10-CM | POA: Diagnosis not present

## 2021-08-24 DIAGNOSIS — R2689 Other abnormalities of gait and mobility: Secondary | ICD-10-CM | POA: Diagnosis not present

## 2021-08-24 DIAGNOSIS — R2681 Unsteadiness on feet: Secondary | ICD-10-CM | POA: Diagnosis not present

## 2021-08-24 DIAGNOSIS — R278 Other lack of coordination: Secondary | ICD-10-CM | POA: Diagnosis not present

## 2021-08-28 DIAGNOSIS — R278 Other lack of coordination: Secondary | ICD-10-CM | POA: Diagnosis not present

## 2021-08-28 DIAGNOSIS — M62571 Muscle wasting and atrophy, not elsewhere classified, right ankle and foot: Secondary | ICD-10-CM | POA: Diagnosis not present

## 2021-08-28 DIAGNOSIS — G912 (Idiopathic) normal pressure hydrocephalus: Secondary | ICD-10-CM | POA: Diagnosis not present

## 2021-08-28 DIAGNOSIS — R2689 Other abnormalities of gait and mobility: Secondary | ICD-10-CM | POA: Diagnosis not present

## 2021-08-28 DIAGNOSIS — R2681 Unsteadiness on feet: Secondary | ICD-10-CM | POA: Diagnosis not present

## 2021-08-28 DIAGNOSIS — Z9181 History of falling: Secondary | ICD-10-CM | POA: Diagnosis not present

## 2021-08-31 DIAGNOSIS — R278 Other lack of coordination: Secondary | ICD-10-CM | POA: Diagnosis not present

## 2021-08-31 DIAGNOSIS — R2681 Unsteadiness on feet: Secondary | ICD-10-CM | POA: Diagnosis not present

## 2021-08-31 DIAGNOSIS — G912 (Idiopathic) normal pressure hydrocephalus: Secondary | ICD-10-CM | POA: Diagnosis not present

## 2021-08-31 DIAGNOSIS — M62571 Muscle wasting and atrophy, not elsewhere classified, right ankle and foot: Secondary | ICD-10-CM | POA: Diagnosis not present

## 2021-08-31 DIAGNOSIS — R2689 Other abnormalities of gait and mobility: Secondary | ICD-10-CM | POA: Diagnosis not present

## 2021-08-31 DIAGNOSIS — Z9181 History of falling: Secondary | ICD-10-CM | POA: Diagnosis not present

## 2021-09-07 DIAGNOSIS — R2681 Unsteadiness on feet: Secondary | ICD-10-CM | POA: Diagnosis not present

## 2021-09-07 DIAGNOSIS — Z9181 History of falling: Secondary | ICD-10-CM | POA: Diagnosis not present

## 2021-09-07 DIAGNOSIS — R278 Other lack of coordination: Secondary | ICD-10-CM | POA: Diagnosis not present

## 2021-09-07 DIAGNOSIS — G912 (Idiopathic) normal pressure hydrocephalus: Secondary | ICD-10-CM | POA: Diagnosis not present

## 2021-09-07 DIAGNOSIS — R2689 Other abnormalities of gait and mobility: Secondary | ICD-10-CM | POA: Diagnosis not present

## 2021-09-07 DIAGNOSIS — M62571 Muscle wasting and atrophy, not elsewhere classified, right ankle and foot: Secondary | ICD-10-CM | POA: Diagnosis not present

## 2021-09-18 DIAGNOSIS — G912 (Idiopathic) normal pressure hydrocephalus: Secondary | ICD-10-CM | POA: Diagnosis not present

## 2021-09-18 DIAGNOSIS — Z6835 Body mass index (BMI) 35.0-35.9, adult: Secondary | ICD-10-CM | POA: Diagnosis not present

## 2021-09-19 ENCOUNTER — Other Ambulatory Visit: Payer: Self-pay | Admitting: Neurological Surgery

## 2021-09-19 DIAGNOSIS — G912 (Idiopathic) normal pressure hydrocephalus: Secondary | ICD-10-CM

## 2021-09-21 DIAGNOSIS — R2681 Unsteadiness on feet: Secondary | ICD-10-CM | POA: Diagnosis not present

## 2021-09-21 DIAGNOSIS — R2689 Other abnormalities of gait and mobility: Secondary | ICD-10-CM | POA: Diagnosis not present

## 2021-09-21 DIAGNOSIS — R278 Other lack of coordination: Secondary | ICD-10-CM | POA: Diagnosis not present

## 2021-09-21 DIAGNOSIS — Z9181 History of falling: Secondary | ICD-10-CM | POA: Diagnosis not present

## 2021-09-21 DIAGNOSIS — M62571 Muscle wasting and atrophy, not elsewhere classified, right ankle and foot: Secondary | ICD-10-CM | POA: Diagnosis not present

## 2021-09-21 DIAGNOSIS — G912 (Idiopathic) normal pressure hydrocephalus: Secondary | ICD-10-CM | POA: Diagnosis not present

## 2021-10-05 DIAGNOSIS — G912 (Idiopathic) normal pressure hydrocephalus: Secondary | ICD-10-CM | POA: Diagnosis not present

## 2021-10-05 DIAGNOSIS — M62571 Muscle wasting and atrophy, not elsewhere classified, right ankle and foot: Secondary | ICD-10-CM | POA: Diagnosis not present

## 2021-10-05 DIAGNOSIS — R2689 Other abnormalities of gait and mobility: Secondary | ICD-10-CM | POA: Diagnosis not present

## 2021-10-05 DIAGNOSIS — R278 Other lack of coordination: Secondary | ICD-10-CM | POA: Diagnosis not present

## 2021-10-05 DIAGNOSIS — Z9181 History of falling: Secondary | ICD-10-CM | POA: Diagnosis not present

## 2021-10-05 DIAGNOSIS — R2681 Unsteadiness on feet: Secondary | ICD-10-CM | POA: Diagnosis not present

## 2021-10-10 DIAGNOSIS — Z85828 Personal history of other malignant neoplasm of skin: Secondary | ICD-10-CM | POA: Diagnosis not present

## 2021-10-10 DIAGNOSIS — D229 Melanocytic nevi, unspecified: Secondary | ICD-10-CM | POA: Diagnosis not present

## 2021-10-10 DIAGNOSIS — L570 Actinic keratosis: Secondary | ICD-10-CM | POA: Diagnosis not present

## 2021-10-10 DIAGNOSIS — D492 Neoplasm of unspecified behavior of bone, soft tissue, and skin: Secondary | ICD-10-CM | POA: Diagnosis not present

## 2021-10-10 DIAGNOSIS — D692 Other nonthrombocytopenic purpura: Secondary | ICD-10-CM | POA: Diagnosis not present

## 2021-10-10 DIAGNOSIS — L7211 Pilar cyst: Secondary | ICD-10-CM | POA: Diagnosis not present

## 2021-10-10 DIAGNOSIS — L820 Inflamed seborrheic keratosis: Secondary | ICD-10-CM | POA: Diagnosis not present

## 2021-10-10 DIAGNOSIS — L219 Seborrheic dermatitis, unspecified: Secondary | ICD-10-CM | POA: Diagnosis not present

## 2021-10-10 DIAGNOSIS — L814 Other melanin hyperpigmentation: Secondary | ICD-10-CM | POA: Diagnosis not present

## 2021-10-12 ENCOUNTER — Ambulatory Visit
Admission: RE | Admit: 2021-10-12 | Discharge: 2021-10-12 | Disposition: A | Discharge status: Home / Self Care | Payer: Medicare HMO | Source: Ambulatory Visit | Attending: Neurological Surgery | Admitting: Neurological Surgery

## 2021-10-12 DIAGNOSIS — G912 (Idiopathic) normal pressure hydrocephalus: Secondary | ICD-10-CM

## 2021-10-12 DIAGNOSIS — R413 Other amnesia: Secondary | ICD-10-CM | POA: Diagnosis not present

## 2021-10-12 DIAGNOSIS — R2681 Unsteadiness on feet: Secondary | ICD-10-CM | POA: Diagnosis not present

## 2021-10-12 DIAGNOSIS — I6782 Cerebral ischemia: Secondary | ICD-10-CM | POA: Diagnosis not present

## 2021-10-12 DIAGNOSIS — Z982 Presence of cerebrospinal fluid drainage device: Secondary | ICD-10-CM | POA: Diagnosis not present

## 2021-10-15 DIAGNOSIS — Z20822 Contact with and (suspected) exposure to covid-19: Secondary | ICD-10-CM | POA: Diagnosis not present

## 2021-10-16 DIAGNOSIS — Z20822 Contact with and (suspected) exposure to covid-19: Secondary | ICD-10-CM | POA: Diagnosis not present

## 2021-10-19 DIAGNOSIS — Z20822 Contact with and (suspected) exposure to covid-19: Secondary | ICD-10-CM | POA: Diagnosis not present

## 2021-10-20 DIAGNOSIS — Z20822 Contact with and (suspected) exposure to covid-19: Secondary | ICD-10-CM | POA: Diagnosis not present

## 2021-10-25 ENCOUNTER — Other Ambulatory Visit (HOSPITAL_BASED_OUTPATIENT_CLINIC_OR_DEPARTMENT_OTHER): Payer: Self-pay

## 2021-10-25 DIAGNOSIS — G912 (Idiopathic) normal pressure hydrocephalus: Secondary | ICD-10-CM | POA: Diagnosis not present

## 2021-10-25 DIAGNOSIS — Z6835 Body mass index (BMI) 35.0-35.9, adult: Secondary | ICD-10-CM | POA: Diagnosis not present

## 2021-10-25 MED ORDER — FLUAD QUADRIVALENT 0.5 ML IM PRSY
PREFILLED_SYRINGE | INTRAMUSCULAR | 0 refills | Status: DC
  Filled 2021-10-25: qty 0.5, 1d supply, fill #0

## 2021-11-07 DIAGNOSIS — D692 Other nonthrombocytopenic purpura: Secondary | ICD-10-CM | POA: Diagnosis not present

## 2021-11-07 DIAGNOSIS — R234 Changes in skin texture: Secondary | ICD-10-CM | POA: Diagnosis not present

## 2021-11-07 DIAGNOSIS — D229 Melanocytic nevi, unspecified: Secondary | ICD-10-CM | POA: Diagnosis not present

## 2021-11-07 DIAGNOSIS — D492 Neoplasm of unspecified behavior of bone, soft tissue, and skin: Secondary | ICD-10-CM | POA: Diagnosis not present

## 2021-11-07 DIAGNOSIS — L570 Actinic keratosis: Secondary | ICD-10-CM | POA: Diagnosis not present

## 2021-11-07 DIAGNOSIS — L219 Seborrheic dermatitis, unspecified: Secondary | ICD-10-CM | POA: Diagnosis not present

## 2021-11-07 DIAGNOSIS — L905 Scar conditions and fibrosis of skin: Secondary | ICD-10-CM | POA: Diagnosis not present

## 2021-11-07 DIAGNOSIS — L820 Inflamed seborrheic keratosis: Secondary | ICD-10-CM | POA: Diagnosis not present

## 2021-11-07 DIAGNOSIS — L719 Rosacea, unspecified: Secondary | ICD-10-CM | POA: Diagnosis not present

## 2021-11-12 DIAGNOSIS — Z20822 Contact with and (suspected) exposure to covid-19: Secondary | ICD-10-CM | POA: Diagnosis not present

## 2021-11-13 DIAGNOSIS — Z20822 Contact with and (suspected) exposure to covid-19: Secondary | ICD-10-CM | POA: Diagnosis not present

## 2021-11-15 DIAGNOSIS — Z20822 Contact with and (suspected) exposure to covid-19: Secondary | ICD-10-CM | POA: Diagnosis not present

## 2021-11-16 DIAGNOSIS — Z20822 Contact with and (suspected) exposure to covid-19: Secondary | ICD-10-CM | POA: Diagnosis not present

## 2021-11-19 DIAGNOSIS — Z20822 Contact with and (suspected) exposure to covid-19: Secondary | ICD-10-CM | POA: Diagnosis not present

## 2021-11-20 DIAGNOSIS — Z20822 Contact with and (suspected) exposure to covid-19: Secondary | ICD-10-CM | POA: Diagnosis not present

## 2021-11-30 DIAGNOSIS — N400 Enlarged prostate without lower urinary tract symptoms: Secondary | ICD-10-CM | POA: Diagnosis not present

## 2021-11-30 DIAGNOSIS — I10 Essential (primary) hypertension: Secondary | ICD-10-CM | POA: Diagnosis not present

## 2021-11-30 DIAGNOSIS — R69 Illness, unspecified: Secondary | ICD-10-CM | POA: Diagnosis not present

## 2021-11-30 DIAGNOSIS — I2510 Atherosclerotic heart disease of native coronary artery without angina pectoris: Secondary | ICD-10-CM | POA: Diagnosis not present

## 2021-11-30 DIAGNOSIS — M1990 Unspecified osteoarthritis, unspecified site: Secondary | ICD-10-CM | POA: Diagnosis not present

## 2021-11-30 DIAGNOSIS — K219 Gastro-esophageal reflux disease without esophagitis: Secondary | ICD-10-CM | POA: Diagnosis not present

## 2021-12-05 DIAGNOSIS — L570 Actinic keratosis: Secondary | ICD-10-CM | POA: Diagnosis not present

## 2021-12-05 DIAGNOSIS — L718 Other rosacea: Secondary | ICD-10-CM | POA: Diagnosis not present

## 2021-12-05 DIAGNOSIS — L821 Other seborrheic keratosis: Secondary | ICD-10-CM | POA: Diagnosis not present

## 2021-12-05 DIAGNOSIS — Z85828 Personal history of other malignant neoplasm of skin: Secondary | ICD-10-CM | POA: Diagnosis not present

## 2021-12-05 DIAGNOSIS — L905 Scar conditions and fibrosis of skin: Secondary | ICD-10-CM | POA: Diagnosis not present

## 2021-12-27 ENCOUNTER — Telehealth: Payer: Self-pay | Admitting: *Deleted

## 2021-12-27 MED ORDER — DILTIAZEM HCL ER COATED BEADS 240 MG PO CP24: 240.0000 mg | ORAL_CAPSULE | Freq: Every day | ORAL | 0 refills | Status: DC

## 2021-12-27 MED ORDER — HYDRALAZINE HCL 25 MG PO TABS: 25.0000 mg | ORAL_TABLET | Freq: Three times a day (TID) | ORAL | 0 refills | Status: DC

## 2021-12-27 MED ORDER — ATORVASTATIN CALCIUM 10 MG PO TABS: 10.0000 mg | ORAL_TABLET | Freq: Every day | ORAL | 0 refills | Status: DC

## 2021-12-27 NOTE — Telephone Encounter (Signed)
Pt would like to change pharmacy from [facility] to [facility] refills of Diltiazem 240 mg Hydralazine  Atorvastatin 10 mg #90 Sent to pharmacy

## 2022-02-06 DIAGNOSIS — D229 Melanocytic nevi, unspecified: Secondary | ICD-10-CM | POA: Diagnosis not present

## 2022-02-06 DIAGNOSIS — Z86010 Personal history of colonic polyps: Secondary | ICD-10-CM | POA: Diagnosis not present

## 2022-02-06 DIAGNOSIS — Z85828 Personal history of other malignant neoplasm of skin: Secondary | ICD-10-CM | POA: Diagnosis not present

## 2022-02-06 DIAGNOSIS — L570 Actinic keratosis: Secondary | ICD-10-CM | POA: Diagnosis not present

## 2022-02-06 DIAGNOSIS — H35033 Hypertensive retinopathy, bilateral: Secondary | ICD-10-CM | POA: Diagnosis not present

## 2022-02-06 DIAGNOSIS — L718 Other rosacea: Secondary | ICD-10-CM | POA: Diagnosis not present

## 2022-02-06 DIAGNOSIS — M1990 Unspecified osteoarthritis, unspecified site: Secondary | ICD-10-CM | POA: Diagnosis not present

## 2022-02-06 DIAGNOSIS — L853 Xerosis cutis: Secondary | ICD-10-CM | POA: Diagnosis not present

## 2022-02-06 DIAGNOSIS — I2510 Atherosclerotic heart disease of native coronary artery without angina pectoris: Secondary | ICD-10-CM | POA: Diagnosis not present

## 2022-02-06 DIAGNOSIS — I10 Essential (primary) hypertension: Secondary | ICD-10-CM | POA: Diagnosis not present

## 2022-02-06 DIAGNOSIS — Z8679 Personal history of other diseases of the circulatory system: Secondary | ICD-10-CM | POA: Diagnosis not present

## 2022-02-06 DIAGNOSIS — Z952 Presence of prosthetic heart valve: Secondary | ICD-10-CM | POA: Diagnosis not present

## 2022-02-06 DIAGNOSIS — L814 Other melanin hyperpigmentation: Secondary | ICD-10-CM | POA: Diagnosis not present

## 2022-02-06 DIAGNOSIS — G912 (Idiopathic) normal pressure hydrocephalus: Secondary | ICD-10-CM | POA: Diagnosis not present

## 2022-02-06 DIAGNOSIS — R7303 Prediabetes: Secondary | ICD-10-CM | POA: Diagnosis not present

## 2022-02-06 DIAGNOSIS — L219 Seborrheic dermatitis, unspecified: Secondary | ICD-10-CM | POA: Diagnosis not present

## 2022-02-06 DIAGNOSIS — K219 Gastro-esophageal reflux disease without esophagitis: Secondary | ICD-10-CM | POA: Diagnosis not present

## 2022-02-06 DIAGNOSIS — D2372 Other benign neoplasm of skin of left lower limb, including hip: Secondary | ICD-10-CM | POA: Diagnosis not present

## 2022-02-06 DIAGNOSIS — N400 Enlarged prostate without lower urinary tract symptoms: Secondary | ICD-10-CM | POA: Diagnosis not present

## 2022-02-06 DIAGNOSIS — D692 Other nonthrombocytopenic purpura: Secondary | ICD-10-CM | POA: Diagnosis not present

## 2022-03-04 NOTE — Progress Notes (Signed)
Cardiology Office Note:    Date:  03/12/2022   ID:  Joseph [name], DOB 18-Jul-1940, MRN ID:[id number]  PCP:  Joseph Arabian, MD  Joseph [name] Cardiologist:  Joseph Grooms, MD     Referring MD: Joseph Arabian, MD   Chief Complaint:  Follow-up     History of Present Illness:   Joseph [name] is a 82 y.o. 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  Amiodarone stopped 11/2016 Coumadin stopped 01/2017.  He [name] maintaining normal sinus rhythm and blood pressure [name] well controlled at that time.  SBE prophylaxis recommended.   I last saw the patient 12/2021 after ED had COVID-pneumonia.  I ordered an echo which showed EF 40 to 45% similar to his last echo.  Valve stable.  Patient comes in for f/u. He turned 50 yesterday. Denies chest pain, dyspnea, edema. No regular exercise because of balance issues. Losartan stopped-he's not sure why but listed in his allergies.           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[place])    03-03-14 had VP shunt inserted   Pre-diabetes    Rupture of left quadriceps tendon 07/21/2014   Skin cancer, basal cell    Urinary incontinence    Current Medications: Current Meds  Medication Sig   acetaminophen (TYLENOL) 500 MG tablet Take 500-1,000 mg by mouth 4 (four) times daily as needed for moderate pain.    ammonium lactate (AMLACTIN) 12 % cream Apply topically as needed.   amoxicillin (AMOXIL) 500 MG capsule Take 500 mg by mouth See admin instructions. Take 4 capsules 1 hour prior to dental  procedures   aspirin EC 81 MG tablet Take 81 mg by mouth daily.    atorvastatin (LIPITOR) 10 MG tablet Take 1 tablet (10 mg total) by mouth daily.   clindamycin (CLEOCIN T) 1 % external solution Apply topically.   diltiazem (CARDIZEM CD) 240 MG 24 hr capsule Take 1 capsule (240 mg total) by mouth daily.   docusate sodium (COLACE) 100 MG capsule Take 100 mg by mouth daily.    econazole nitrate 1 % cream Apply topically.   GEMTESA 75 MG TABS Take 1 tablet by mouth every morning.   hydrALAZINE (APRESOLINE) 25 MG tablet Take 1 tablet (25 mg total) by mouth 3 (three) times daily.   influenza vaccine adjuvanted (FLUAD QUADRIVALENT) 0.5 ML injection Inject into the muscle.   ketoconazole (NIZORAL) 2 % shampoo Apply 1 application topically 2 (two) times a week.   lansoprazole (PREVACID) 30 MG capsule Take 30 mg by mouth 2 (two) times daily.    metroNIDAZOLE (METROCREAM) 0.75 % cream Apply 1 application topically 2 (two) times daily.   nabumetone (RELAFEN) 750 MG tablet Take 1,500 mg by mouth daily.    psyllium (REGULOID) 0.52 g capsule Take 1.56 g by mouth daily.   tolterodine (DETROL LA) 2 MG 24 hr capsule Take 2 mg by mouth every morning.    Allergies:   Ace inhibitors, Atenolol,  Beta adrenergic blockers, Erythromycin, and Ciprofloxacin   Social History   Tobacco Use   Smoking status: Former    Types: Cigarettes, Cigars, Pipe   Smokeless tobacco: Never   Tobacco comments:    Quit in the 80's  Vaping Use   Vaping Use: Never used  Substance Use Topics   Alcohol use: Yes    Alcohol/week: 2.0 - 3.0 standard drinks of alcohol    Types: 2 - 3 Glasses of wine per week    Comment: occasional 1-2 times week   Drug use: No    Family Hx: The patient's family history includes Heart attack in his maternal grandmother; Heart disease in his mother. There is no history of Hypertension or Stroke.  ROS     Physical Exam:    VS:  BP (!) 140/70   Pulse 92   Ht 5' 9.5" (1.765 m)   Wt 245 lb  (111.1 kg)   SpO2 97%   BMI 35.66 kg/m     Wt Readings from Last 3 Encounters:  03/12/22 245 lb (111.1 kg)  03/06/21 248 lb 12.8 oz (112.9 kg)  12/31/20 255 lb (115.7 kg)    Physical Exam  GEN: Obese, in no acute distress  Neck: no JVD, carotid bruits, or masses Cardiac:RRR; 1/6 systolic murmur LSB Respiratory:  clear to auscultation bilaterally, normal work of breathing GI: soft, nontender, nondistended, + BS Ext: without cyanosis, clubbing, or edema, Good distal pulses bilaterally Neuro:  Alert and Oriented x 3 Psych: euthymic mood, full affect        EKGs/Labs/Other Test Reviewed:    EKG:  EKG is   ordered today.  The ekg ordered today demonstrates NSR with first degree AV block with PVC's PAC's  Recent Labs: No results found for requested labs within last 365 days.   Recent Lipid Panel No results for input(s): "CHOL", "TRIG", "HDL", "VLDL", "LDLCALC", "LDLDIRECT" in the last 8760 hours.   Prior CV Studies:   2D echo 03/15/2021 IMPRESSIONS     1. Left ventricular ejection fraction, by estimation, is 40 to 45%. The  left ventricle has mildly decreased function. The left ventricle  demonstrates global hypokinesis. Left ventricular diastolic parameters are  indeterminate.   2. Right ventricular systolic function is mildly reduced. The right  ventricular size is mildly enlarged. Tricuspid regurgitation signal is  inadequate for assessing PA pressure.   3. The mitral valve is normal in structure. Trivial mitral valve  regurgitation. No evidence of mitral stenosis.   4. Well seated 23 Magna bioprosthetic aortic valve with no evidence of  stenosis or regurgitation. Procedure Date: 09/20/2016. Aortic valve mean  gradient measures 15.0 mmHg. Aortic valve Vmax measures 2.51 m/s.   5. The inferior vena cava is normal in size with greater than 50%  respiratory variability, suggesting right atrial pressure of 3 mmHg.  EE 09/20/16 Left ventricle: Normal cavity size, wall  thickness, left ventricular  diastolic function and left atrial pressure. LV systolic function is  mildly reduced with an EF of 45-50%. There are no obvious wall motion  abnormalities.   Aortic valve: The valve is trileaflet. Severe valve thickening present.  Severe valve calcification present. Severely decreased leaflet separation.  Severe stenosis. Mild to moderate regurgitation.   Mitral valve: Mild regurgitation.   Right ventricle: Normal cavity size, wall thickness and ejection  fraction.   Tricuspid valve: Mild regurgitation.    Echo 08/12/16 Study Conclusions   - Left ventricle: The cavity size [name] normal. Wall thickness [name]  normal. Systolic function [name] normal. The estimated ejection    fraction [name] in the range of 55% to 60%. Wall motion [name] normal;    there were no regional wall motion abnormalities. There [name]    fusion of early and atrial contributions to ventricular filling.  - Aortic valve: Valve mobility [name] severely restricted. There [name]    severe stenosis. Mean gradient (S): 46 mm Hg. Peak gradient (S):    76 mm Hg. Valve area (VTI): 0.88 cm^2. Valve area (Vmax): 0.82    cm^2. Valve area (Vmean): 0.81 cm^2.     Risk Assessment/Calculations/Metrics:         HYPERTENSION CONTROL Vitals:   03/12/22 0756 03/12/22 0821  BP: (!) 148/56 (!) 140/70    The patient's blood pressure is elevated above target today.  In order to address the patient's elevated BP: Blood pressure will be monitored at home to determine if medication changes need to be made.       ASSESSMENT & PLAN:   No problem-specific Assessment & Plan notes found for this encounter.   CAD status post CABG and AVR 09/20/2016 with postop atrial fibrillation converted to normal sinus rhythm-no chest pain. 150 min exercise weekly, labs reviewed on [facility] and are normal  Reduced LVEF 40 to 45% on echo 02/2021 similar to TEE in 2018   Hypertension well controlled on diltiazem and hydralazine. No longer  on losartan.   Hyperlipidemia LDL 66 01/2022 on lipitor   Morbid obesity- weight loss  recommended.            Dispo:  No follow-ups on file.   Medication Adjustments/Labs and Tests Ordered: Current medicines are reviewed at length with the patient today.  Concerns regarding medicines are outlined above.  Tests Ordered: Orders Placed This Encounter  Procedures   EKG 12-Lead   Medication Changes: No orders of the defined types were placed in this encounter.  Sumner Boast, PA-C  03/12/2022 8:28 AM    [facility] [place], [place], [place]  [address] Phone: [phone]; Fax: [phone]

## 2022-03-12 ENCOUNTER — Encounter: Payer: Self-pay | Admitting: Physician Assistant

## 2022-03-12 ENCOUNTER — Ambulatory Visit: Payer: Medicare HMO | Attending: Physician Assistant | Admitting: Physician Assistant

## 2022-03-12 VITALS — BP 140/70 | HR 92 | Ht 69.5 in | Wt 245.0 lb

## 2022-03-12 DIAGNOSIS — Z951 Presence of aortocoronary bypass graft: Secondary | ICD-10-CM | POA: Diagnosis not present

## 2022-03-12 DIAGNOSIS — Z952 Presence of prosthetic heart valve: Secondary | ICD-10-CM | POA: Diagnosis not present

## 2022-03-12 DIAGNOSIS — E785 Hyperlipidemia, unspecified: Secondary | ICD-10-CM | POA: Diagnosis not present

## 2022-03-12 DIAGNOSIS — I519 Heart disease, unspecified: Secondary | ICD-10-CM

## 2022-03-12 DIAGNOSIS — I10 Essential (primary) hypertension: Secondary | ICD-10-CM | POA: Diagnosis not present

## 2022-03-12 NOTE — Patient Instructions (Signed)
Medication Instructions:  .isntcur  *If you need a refill on your cardiac medications before your next appointment, please call your pharmacy*   Lab Work: None ordered   If you have labs (blood work) drawn today and your tests are completely normal, you will receive your results only by: [place] (if you have MyChart) OR A paper copy in the mail If you have any lab test that is abnormal or we need to change your treatment, we will call you to review the results.   Testing/Procedures: None ordered    Follow-Up: At [facility], you and your health needs are our priority.  As part of our continuing mission to provide you with exceptional heart care, we have created designated Provider Care Teams.  These Care Teams include your primary Cardiologist (physician) and Advanced Practice Providers (APPs -  Physician Assistants and Nurse Practitioners) who all work together to provide you with the care you need, when you need it.  We recommend signing up for the patient portal called "MyChart".  Sign up information is provided on this After Visit Summary.  MyChart is used to connect with patients for Virtual Visits (Telemedicine).  Patients are able to view lab/test results, encounter notes, upcoming appointments, etc.  Non-urgent messages can be sent to your provider as well.   To learn more about what you can do with MyChart, go to [email or web address].    Your next appointment:   12 month(s)  Provider:   Larae Grooms, MD     Other Instructions Your physician recommends that you get 150 minutes of exercise a week

## 2022-03-15 ENCOUNTER — Other Ambulatory Visit: Payer: Self-pay | Admitting: Interventional Cardiology

## 2022-03-29 DIAGNOSIS — N3941 Urge incontinence: Secondary | ICD-10-CM | POA: Diagnosis not present

## 2022-03-29 DIAGNOSIS — N3281 Overactive bladder: Secondary | ICD-10-CM | POA: Diagnosis not present

## 2022-04-15 ENCOUNTER — Other Ambulatory Visit (HOSPITAL_COMMUNITY): Payer: Self-pay

## 2022-04-18 DIAGNOSIS — L570 Actinic keratosis: Secondary | ICD-10-CM | POA: Diagnosis not present

## 2022-04-18 DIAGNOSIS — L853 Xerosis cutis: Secondary | ICD-10-CM | POA: Diagnosis not present

## 2022-04-18 DIAGNOSIS — L218 Other seborrheic dermatitis: Secondary | ICD-10-CM | POA: Diagnosis not present

## 2022-08-06 DIAGNOSIS — H903 Sensorineural hearing loss, bilateral: Secondary | ICD-10-CM | POA: Diagnosis not present

## 2022-08-15 DIAGNOSIS — I2510 Atherosclerotic heart disease of native coronary artery without angina pectoris: Secondary | ICD-10-CM | POA: Diagnosis not present

## 2022-08-15 DIAGNOSIS — Z0000 Encounter for general adult medical examination without abnormal findings: Secondary | ICD-10-CM | POA: Diagnosis not present

## 2022-08-15 DIAGNOSIS — Z952 Presence of prosthetic heart valve: Secondary | ICD-10-CM | POA: Diagnosis not present

## 2022-08-15 DIAGNOSIS — Z1331 Encounter for screening for depression: Secondary | ICD-10-CM | POA: Diagnosis not present

## 2022-08-15 DIAGNOSIS — K219 Gastro-esophageal reflux disease without esophagitis: Secondary | ICD-10-CM | POA: Diagnosis not present

## 2022-08-15 DIAGNOSIS — Z86010 Personal history of colonic polyps: Secondary | ICD-10-CM | POA: Diagnosis not present

## 2022-08-15 DIAGNOSIS — I10 Essential (primary) hypertension: Secondary | ICD-10-CM | POA: Diagnosis not present

## 2022-08-15 DIAGNOSIS — R7303 Prediabetes: Secondary | ICD-10-CM | POA: Diagnosis not present

## 2022-08-15 DIAGNOSIS — M1990 Unspecified osteoarthritis, unspecified site: Secondary | ICD-10-CM | POA: Diagnosis not present

## 2022-08-15 DIAGNOSIS — G912 (Idiopathic) normal pressure hydrocephalus: Secondary | ICD-10-CM | POA: Diagnosis not present

## 2022-08-15 DIAGNOSIS — Z8679 Personal history of other diseases of the circulatory system: Secondary | ICD-10-CM | POA: Diagnosis not present

## 2022-08-15 DIAGNOSIS — N400 Enlarged prostate without lower urinary tract symptoms: Secondary | ICD-10-CM | POA: Diagnosis not present

## 2022-08-15 LAB — LAB REPORT - SCANNED
A1c: 5.7
eGFR: 64

## 2022-08-16 ENCOUNTER — Encounter: Payer: Self-pay | Admitting: Family Medicine

## 2022-09-30 DIAGNOSIS — H52203 Unspecified astigmatism, bilateral: Secondary | ICD-10-CM | POA: Diagnosis not present

## 2022-09-30 DIAGNOSIS — H2511 Age-related nuclear cataract, right eye: Secondary | ICD-10-CM | POA: Diagnosis not present

## 2022-10-21 DIAGNOSIS — H25811 Combined forms of age-related cataract, right eye: Secondary | ICD-10-CM | POA: Diagnosis not present

## 2022-10-21 DIAGNOSIS — Z961 Presence of intraocular lens: Secondary | ICD-10-CM | POA: Diagnosis not present

## 2022-10-21 DIAGNOSIS — H2511 Age-related nuclear cataract, right eye: Secondary | ICD-10-CM | POA: Diagnosis not present

## 2022-10-31 ENCOUNTER — Other Ambulatory Visit (HOSPITAL_BASED_OUTPATIENT_CLINIC_OR_DEPARTMENT_OTHER): Payer: Self-pay

## 2022-10-31 MED ORDER — BOOSTRIX 5-2.5-18.5 LF-MCG/0.5 IM SUSY
PREFILLED_SYRINGE | INTRAMUSCULAR | 0 refills | Status: DC
  Filled 2022-10-31: qty 0.5, 1d supply, fill #0

## 2023-02-07 ENCOUNTER — Other Ambulatory Visit: Payer: Self-pay | Admitting: Interventional Cardiology

## 2023-03-11 ENCOUNTER — Telehealth: Payer: Self-pay | Admitting: Physician Assistant

## 2023-03-11 DIAGNOSIS — R0602 Shortness of breath: Secondary | ICD-10-CM | POA: Diagnosis not present

## 2023-03-11 NOTE — Telephone Encounter (Signed)
Spoke with pt's wife and she stated he has been sleeping a lot.

## 2023-03-11 NOTE — Telephone Encounter (Signed)
Pt c/o Shortness Of Breath: STAT if SOB developed within the last 24 hours or pt is noticeably SOB on the phone  1. Are you currently SOB (can you hear that pt is SOB on the phone)? a little at this time, had it worse earlier this morning   2. How long have you been experiencing SOB? About 2 weeks  3. Are you SOB when sitting or when up moving around? When he moves around  4. Are you currently experiencing any other symptoms? No- patient wanted to be seen- I made him an appointment with Micah Flesher on 01-11-24(Friday) please call to evaluate

## 2023-03-11 NOTE — Telephone Encounter (Signed)
Spoke with pt's wife and she stated he has been SOB for a couple of weeks and sleeping a lot. Denies any CP or edema. Pt's wife stated that they both have a slight cough but it comes and goes. Pt is scheduled to see Micah Flesher, PA-C on 2/21. Dr. Jacinto Halim (DOD 2/18) stated to have the pt monitor symptoms and order a Pro-BNP in the meantime of waiting for appt on 2/21. Lab ordered and released- pt is aware.

## 2023-03-12 ENCOUNTER — Other Ambulatory Visit: Payer: Self-pay | Admitting: Cardiology

## 2023-03-12 ENCOUNTER — Other Ambulatory Visit: Payer: Self-pay

## 2023-03-12 LAB — PRO B NATRIURETIC PEPTIDE: NT-Pro BNP: 1426 pg/mL — ABNORMAL HIGH (ref 0–486)

## 2023-03-12 MED ORDER — FUROSEMIDE 40 MG PO TABS: 40.0000 mg | ORAL_TABLET | Freq: Every day | ORAL | 1 refills | Status: DC

## 2023-03-12 MED ORDER — POTASSIUM CHLORIDE CRYS ER 20 MEQ PO TBCR: 20.0000 meq | EXTENDED_RELEASE_TABLET | Freq: Every day | ORAL | 1 refills | Status: DC

## 2023-03-12 NOTE — Progress Notes (Signed)
Lasix 40 mg daily with KCL 20 mEq daily. Reduce salt intake and no canned vegetables or soup from outside home. Weight daily

## 2023-03-12 NOTE — Progress Notes (Unsigned)
Cardiology Office Note:    Date:  03/14/2023   ID:  MUNEER LEIDER, DOB February 10, 1940, MRN [id number]  PCP:  Blair Heys, MD (Inactive)   [facility] Providers Cardiologist:  Bryan Lemma, MD Cardiology APP:  Marcelino Duster, PA     Referring MD: Blair Heys, MD   Chief Complaint  Patient presents with   Follow-up   Shortness of Breath   Atrial Fibrillation    History of Present Illness:    Joseph [name] is a 83 y.o. 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  Amiodarone subsequently stopped 11/2016 and Coumadin stopped 01/2017.  He has maintained normal sinus rhythm.  He understands SBE prophylaxis.  Echocardiogram following COVID-pneumonia in 12/2021 showed an LVEF 40-45%-similar to his last echo.  Good aortic valve function.  He called our office 03/11/2023 with shortness of breath and was started on Lasix 40 mg along with 20 mEq potassium supplementation.  He was added to my schedule today.  proBNP was 1426  During my interview he reports diuresis of at least 11 lbs since 03/10/26. No chest pain. He reports improvement in DOE and orthopnea, no LE swelling. No palpitations.    He is unaware of any arrhythmia. Paperwork from [place] PCP reports an allergy to losartan, but he denies this. He has a cough with ACEI. He is unaware of a BB allergy resulting in SOB and is willing to try low dose metoprolol (allergy listed is to atenolol).   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HCC)    03-03-14 had VP shunt inserted   Pre-diabetes     Rupture of left quadriceps tendon 07/21/2014   Skin cancer, basal cell    Urinary incontinence     Past Surgical History:  Procedure Laterality Date   ANAL FISSURE REPAIR     AORTIC VALVE REPLACEMENT N/A 09/20/2016   Procedure: AORTIC VALVE REPLACEMENT (AVR) USING MAGNA EASE 23 MM PERICARDIAL TISSUE HEART VALVE MODEL 3300TFX;  Surgeon: Alleen Borne, MD;  Location[facility] OR;  Service: Open Heart Surgery;  Laterality: N/A;   BACK SURGERY     CARDIAC CATHETERIZATION     COLONOSCOPY W/ POLYPECTOMY     CORONARY ARTERY BYPASS GRAFT N/A 09/20/2016   Procedure: CORONARY ARTERY BYPASS GRAFTING (CABG)x 3 WITH ENDOSCOPIC HARVESTING OF RIGHT SAPHENOUS VEIN;  Surgeon: Alleen Borne, MD;  Location: [facility] OR;  Service: Open Heart Surgery;  Laterality: N/A;   ELBOW SURGERY Right    FINGER SURGERY     left shoulder RTC tear     LEG SURGERY     tendon repair right leg   MICRODISCECTOMY LUMBAR     l5-s1   QUADRICEPS TENDON REPAIR Left 07/21/2014   Procedure: REPAIR LEFT QUADRICEP TENDON;  Surgeon: Teryl Lucy, MD;  Location: [facility];  Service: Orthopedics;  Laterality: Left;   RIGHT/LEFT HEART CATH AND CORONARY ANGIOGRAPHY N/A 09/03/2016   Procedure: RIGHT/LEFT HEART CATH AND CORONARY ANGIOGRAPHY;  Surgeon: Corky Crafts, MD;  Location:  [facility] INVASIVE CV LAB;  Service: Cardiovascular;  Laterality: N/A;   TEE WITHOUT CARDIOVERSION N/A 09/20/2016   Procedure: TRANSESOPHAGEAL ECHOCARDIOGRAM (TEE);  Surgeon: Alleen Borne, MD;  Location: [facility] OR;  Service: Open Heart Surgery;  Laterality: N/A;   TONSILLECTOMY     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   Procedure: SHUNT INSERTION VENTRICULAR-PERITONEAL;  Surgeon: Tia Alert, MD;  Location: [facility] NEURO ORS;  Service: Neurosurgery;  Laterality: Right;    Current Medications: Current Meds  Medication Sig   acetaminophen (TYLENOL) 500 MG tablet Take 500-1,000 mg by mouth 4 (four) times daily as needed for moderate pain.    ammonium  lactate (AMLACTIN) 12 % cream Apply topically as needed.   amoxicillin (AMOXIL) 500 MG capsule Take 500 mg by mouth See admin instructions. Take 4 capsules 1 hour prior to dental procedures   atorvastatin (LIPITOR) 10 MG tablet TAKE ONE TABLET BY MOUTH DAILY   clindamycin (CLEOCIN T) 1 % external solution Apply topically.   docusate sodium (COLACE) 100 MG capsule Take 100 mg by mouth daily.    econazole nitrate 1 % cream Apply topically.   furosemide (LASIX) 40 MG tablet Take 1 tablet (40 mg total) by mouth daily.   GEMTESA 75 MG TABS Take 1 tablet by mouth every morning.   hydrALAZINE (APRESOLINE) 25 MG tablet TAKE ONE TABLET BY MOUTH THREE TIMES DAILY   ketoconazole (NIZORAL) 2 % shampoo Apply 1 application topically 2 (two) times a week.   lansoprazole (PREVACID) 30 MG capsule Take 30 mg by mouth 2 (two) times daily.    metroNIDAZOLE (METROCREAM) 0.75 % cream Apply 1 application topically 2 (two) times daily.   nabumetone (RELAFEN) 750 MG tablet Take 1,500 mg by mouth daily.    potassium chloride SA (KLOR-CON M20) 20 MEQ tablet Take 1 tablet (20 mEq total) by mouth daily.   psyllium (REGULOID) 0.52 g capsule Take 1.56 g by mouth daily.   tolterodine (DETROL LA) 2 MG 24 hr capsule Take 2 mg by mouth every morning.   [DISCONTINUED] apixaban (ELIQUIS) 5 MG TABS tablet Take 1 tablet (5 mg total) by mouth 2 (two) times daily.   [DISCONTINUED] aspirin EC 81 MG tablet Take 81 mg by mouth daily.    [DISCONTINUED] diltiazem (CARDIZEM CD) 240 MG 24 hr capsule TAKE ONE CAPSULE BY MOUTH IN THE MORNING   [DISCONTINUED] losartan (COZAAR) 100 MG tablet Take 100 mg by mouth daily.   [DISCONTINUED] metoprolol tartrate (LOPRESSOR) 25 MG tablet Take 1 tablet (25 mg total) by mouth 2 (two) times daily.   [DISCONTINUED] sacubitril-valsartan (ENTRESTO) 24-26 MG Take 1 tablet by mouth 2 (two) times daily.     Allergies:   Ace inhibitors, Atenolol, Beta adrenergic blockers, Erythromycin, and Ciprofloxacin    Social History   Socioeconomic History   Marital status: Married    Spouse name: Not on file   Number of children: Not on file   Years of education: Not on file   Highest education level: Not on file  Occupational History   Not on file  Tobacco Use   Smoking status: Former    Types: Cigarettes, Cigars, Pipe   Smokeless tobacco: Never   Tobacco comments:    Quit in the 80's  Vaping Use   Vaping status: Never Used  Substance and Sexual Activity   Alcohol use: Yes    Alcohol/week: 2.0 - 3.0 standard drinks of alcohol    Types: 2 - 3 Glasses of wine per week    Comment:  occasional 1-2 times week   Drug use: No   Sexual activity: Not Currently  Other Topics Concern   Not on file  Social History Narrative   Not on file   Social Drivers of Health   Financial Resource Strain: Not on file  Food Insecurity: Not on file  Transportation Needs: Not on file  Physical Activity: Not on file  Stress: Not on file  Social Connections: Not on file     Family History: The patient's family history includes Heart attack in his maternal grandmother; Heart disease in his mother. There is no history of Hypertension or Stroke.  ROS:   Please see the history of present illness.     All other systems reviewed and are negative.  EKGs/Labs/Other Studies Reviewed:    The following studies were reviewed today:  Echo pending EKG Interpretation Date/Time:  Friday March 14 2023 13:31:23 EST Ventricular Rate:  94 PR Interval:    QRS Duration:  116 QT Interval:  388 QTC Calculation: 485 R Axis:   43  Text Interpretation: Atrial fibrillation with premature ventricular or aberrantly conducted complexes ST & T wave abnormality, consider inferior ischemia Prolonged QT When compared with ECG of 25-Sep-2016 07:42, Atrial fibrillation has replaced Sinus rhythm Non-specific change in ST segment in Inferior leads ST now depressed in Lateral leads Confirmed by Micah Flesher ([id number]) on 03/14/2023  1:46:58 PM    Recent Labs: 03/11/2023: NT-Pro BNP 1,426  Recent Lipid Panel    Component Value Date/Time   CHOL 130 01/31/2017 0855   TRIG 110 01/31/2017 0855   HDL 62 01/31/2017 0855   CHOLHDL 2.1 01/31/2017 0855   LDLCALC 46 01/31/2017 0855     Risk Assessment/Calculations:    CHA2DS2-VASc Score = 5   This indicates a 7.2% annual risk of stroke. The patient's score is based upon: CHF History: 1 HTN History: 1 Diabetes History: 0 Stroke History: 0 Vascular Disease History: 1 Age Score: 2 Gender Score: 0             Physical Exam:    VS:  BP 118/68 (BP Location: Left Arm, Patient Position: Sitting, Cuff Size: Normal)   Pulse 94   Ht 5\' 9"  (1.753 m)   Wt 242 lb (109.8 kg)   BMI 35.74 kg/m     Wt Readings from Last 3 Encounters:  03/14/23 242 lb (109.8 kg)  03/12/22 245 lb (111.1 kg)  03/06/21 248 lb 12.8 oz (112.9 kg)     GEN:  Well nourished, well developed in no acute distress HEENT: Normal NECK: No JVD; No carotid bruits LYMPHATICS: No lymphadenopathy CARDIAC: irregular rhythm, regular rhythm, systolic murmur 3/6 RESPIRATORY:  Clear to auscultation without rales, wheezing or rhonchi  ABDOMEN: Soft, non-tender, non-distended MUSCULOSKELETAL:  B LE pitting edema SKIN: Warm and dry NEUROLOGIC:  Alert and oriented x 3 PSYCHIATRIC:  Normal affect   ASSESSMENT:    1. Shortness of breath   2. Essential hypertension   3. Left ventricular dysfunction   4. Aortic valve stenosis, etiology of cardiac valve disease unspecified   5. Atrial fibrillation, unspecified type (HCC)   6. Acute on chronic systolic heart failure (HCC)   7. Chronic anticoagulation   8. S/P CABG (coronary artery bypass graft)   9. S/P AVR   10. Primary hypertension   11. Hyperlipidemia with target LDL less than 70   12. Morbid obesity (HCC)    PLAN:    In order of problems listed above:  Shortness of breath Acute  on chronic systolic heart failure - LVEF historically  40-45% -Recently started on 40 mg Lasix daily along with 20 mEq potassium -Elevated proBNP 1426 -Suspect his shortness of breath is related to CHF exacerbation in the setting of recurrence of Afib - Will repeat an echocardiogram -- continue 40 mg lasix, start 24-26 mg entresto, stop cardizem, start 25 mg lopressor BID   Atrial fibrillation - recurrence since post op CABG 2018, newly recognized today - he is on 240 mg cardizem, listed allergy to atenolol as SOB - he is willing to trial metoprolol - given reduced EF and exacerbation, I will stop cardizem and start low dose metoprolol as above   Need for anticoagulation - elevated stroke risk given age, CHF, and CAD - will start eliquis 5 mg BID   CAD s/p CABG 2018 - has been on aspirin, no chest pain -- I will discontinued ASA for eliquis   Murmur on exam Aortic stenosis s/p AVR 2018 - Good valve function on echocardiogram 2023 -SBE prophylaxis -- will repeat an echocardiogram   Hypertension - On Cardizem and hydralazine - No longer on losartan -- stop cardizem, start entresto as above, continue hydralazine   Hyperlipidemia with LDL goal less than 70 - Continue Lipitor Cholesterol panel 01/2023: Total chol 123 LDL 50 HDL 61 Trig 51 A1c 1.4%   Morbid obesity - Working on [employer] loss   Call [facility] with medication updates at follow up.   Previously with Dr. Eldridge Dace, now reassigned to Dr. Herbie Baltimore.   Complex situation.  The patient has converted back to atrial fibrillation, although rate controlled.  I suspect he is also having acute on chronic systolic heart failure exacerbation.  He has been on 240 mg Cardizem.  I will discontinue this in favor of rate control with metoprolol.  Decisions are complicated by an allergy listed to atenolol (shortness of breath), although he does not remember this allergy.  I will start low-dose Entresto 24-26 mg twice daily.  He also has an allergy listed to losartan that is not in  our system, possibly related to ED?  I have instructed him to first start metoprolol and wait 3 days prior to starting Entresto to monitor for side effects.  I will also start Eliquis for stroke prophylaxis appropriately dosed at 5 mg twice daily.  In the setting of Eliquis, I will discontinue aspirin.  He has a history of falls although his wife tells me he is fallen 3 times in the last year.  We reviewed ER precautions should he fall on Eliquis.             Medication Adjustments/Labs and Tests Ordered: Current medicines are reviewed at length with the patient today.  Concerns regarding medicines are outlined above.  Orders Placed This Encounter  Procedures   Basic metabolic panel   CBC   Brain natriuretic peptide   EKG 12-Lead   EKG 12-Lead   ECHOCARDIOGRAM COMPLETE   Meds ordered this encounter  Medications   DISCONTD: metoprolol tartrate (LOPRESSOR) 25 MG tablet    Sig: Take 1 tablet (25 mg total) by mouth 2 (two) times daily.    Dispense:  180 tablet    Refill:  3   DISCONTD: sacubitril-valsartan (ENTRESTO) 24-26 MG    Sig: Take 1 tablet by mouth 2 (two) times daily.    Dispense:  180 tablet    Refill:  3   DISCONTD: apixaban (ELIQUIS) 5 MG TABS tablet    Sig: Take 1 tablet (5  mg total) by mouth 2 (two) times daily.    Dispense:  180 tablet    Refill:  3   apixaban (ELIQUIS) 5 MG TABS tablet    Sig: Take 1 tablet (5 mg total) by mouth 2 (two) times daily.    Dispense:  180 tablet    Refill:  3   metoprolol tartrate (LOPRESSOR) 25 MG tablet    Sig: Take 1 tablet (25 mg total) by mouth 2 (two) times daily.    Dispense:  180 tablet    Refill:  3   sacubitril-valsartan (ENTRESTO) 24-26 MG    Sig: Take 1 tablet by mouth 2 (two) times daily.    Dispense:  180 tablet    Refill:  3    Patient Instructions  Medication Instructions:  Stop the following medications: Aspirin, diltiazem, losartan. Start taking metoprolol 25 mg twice per day, entresto 24-26 twice per day,  elequis 5 mg twice per day. New scripts sent to [facility]. *If you need a refill on your cardiac medications before your next appointment, please call your pharmacy*   Lab Work: BMET, BNP, CNC due next Thursday 03/20/23 non fasting. If you have labs (blood work) drawn today and your tests are completely normal, you will receive your results only by: MyChart Message (if you have MyChart) OR A paper copy in the mail If you have any lab test that is abnormal or we need to change your treatment, we will call you to review the results.   Testing/Procedures: Your physician has requested that you have an echocardiogram at [facility] office please in next 2 weeks prior to follow up appointment with A Francene Mcerlean PA-C.  Echocardiography is a painless test that uses sound waves to create images of your heart. It provides your doctor with information about the size and shape of your heart and how well your heart's chambers and valves are working. This procedure takes approximately one hour. There are no restrictions for this procedure. Please do NOT wear cologne, perfume, aftershave, or lotions (deodorant is allowed). Please arrive 15 minutes prior to your appointment time.  Please note: We ask at that you not bring children with you during ultrasound (echo/ vascular) testing. Due to room size and safety concerns, children are not allowed in the ultrasound rooms during exams. Our front office staff cannot provide observation of children in our lobby area while testing is being conducted. An adult accompanying a patient to their appointment will only be allowed in the ultrasound room at the discretion of the ultrasound technician under special circumstances. We apologize for any inconvenience.    Follow-Up: At [facility], you and your health needs are our priority.  As part of our continuing mission to provide you with exceptional heart care, we have created designated Provider Care Teams.   These Care Teams include your primary Cardiologist (physician) and Advanced Practice Providers (APPs -  Physician Assistants and Nurse Practitioners) who all work together to provide you with the care you need, when you need it.  We recommend signing up for the patient portal called "MyChart".  Sign up information is provided on this After Visit Summary.  MyChart is used to connect with patients for Virtual Visits (Telemedicine).  Patients are able to view lab/test results, encounter notes, upcoming appointments, etc.  Non-urgent messages can be sent to your provider as well.   To learn more about what you can do with MyChart, go to [email or web address].    Your next appointment:  2 week(s)  Provider:   Micah Flesher, PA-C        Other Instructions         Signed, Marcelino Duster, PA  03/14/2023 5:02 PM    [facility]

## 2023-03-14 ENCOUNTER — Other Ambulatory Visit: Payer: Self-pay | Admitting: Physician Assistant

## 2023-03-14 ENCOUNTER — Other Ambulatory Visit: Payer: Self-pay

## 2023-03-14 ENCOUNTER — Ambulatory Visit: Payer: Medicare HMO | Attending: Physician Assistant | Admitting: Physician Assistant

## 2023-03-14 ENCOUNTER — Encounter: Payer: Self-pay | Admitting: Physician Assistant

## 2023-03-14 VITALS — BP 118/68 | HR 94 | Ht 69.0 in | Wt 242.0 lb

## 2023-03-14 DIAGNOSIS — Z951 Presence of aortocoronary bypass graft: Secondary | ICD-10-CM | POA: Diagnosis not present

## 2023-03-14 DIAGNOSIS — I350 Nonrheumatic aortic (valve) stenosis: Secondary | ICD-10-CM

## 2023-03-14 DIAGNOSIS — Z7901 Long term (current) use of anticoagulants: Secondary | ICD-10-CM

## 2023-03-14 DIAGNOSIS — I5023 Acute on chronic systolic (congestive) heart failure: Secondary | ICD-10-CM

## 2023-03-14 DIAGNOSIS — I519 Heart disease, unspecified: Secondary | ICD-10-CM | POA: Diagnosis not present

## 2023-03-14 DIAGNOSIS — E785 Hyperlipidemia, unspecified: Secondary | ICD-10-CM | POA: Diagnosis not present

## 2023-03-14 DIAGNOSIS — R0602 Shortness of breath: Secondary | ICD-10-CM

## 2023-03-14 DIAGNOSIS — I10 Essential (primary) hypertension: Secondary | ICD-10-CM | POA: Diagnosis not present

## 2023-03-14 DIAGNOSIS — Z952 Presence of prosthetic heart valve: Secondary | ICD-10-CM | POA: Diagnosis not present

## 2023-03-14 DIAGNOSIS — I4891 Unspecified atrial fibrillation: Secondary | ICD-10-CM

## 2023-03-14 MED ORDER — SACUBITRIL-VALSARTAN 24-26 MG PO TABS: 1.0000 | ORAL_TABLET | Freq: Two times a day (BID) | ORAL | 3 refills | Status: DC

## 2023-03-14 MED ORDER — APIXABAN 5 MG PO TABS: 5.0000 mg | ORAL_TABLET | Freq: Two times a day (BID) | ORAL | 3 refills | Status: DC

## 2023-03-14 MED ORDER — METOPROLOL TARTRATE 25 MG PO TABS: 25.0000 mg | ORAL_TABLET | Freq: Two times a day (BID) | ORAL | 3 refills | Status: DC

## 2023-03-14 MED ORDER — APIXABAN 5 MG PO TABS: 5.0000 mg | ORAL_TABLET | Freq: Two times a day (BID) | ORAL | 3 refills | Status: AC

## 2023-03-14 MED ORDER — METOPROLOL TARTRATE 25 MG PO TABS
25.0000 mg | ORAL_TABLET | Freq: Two times a day (BID) | ORAL | 3 refills | Status: DC
Start: 2023-03-14 — End: 2023-04-16

## 2023-03-14 NOTE — Telephone Encounter (Signed)
Per pharmacy stating,  Pharmacy comment: We have an allergy listed in the patient's profile as severe.  Please advise.  Thank you.   Please address

## 2023-03-14 NOTE — Patient Instructions (Signed)
Medication Instructions:  Stop the following medications: Aspirin, diltiazem, losartan. Start taking metoprolol 25 mg twice per day, entresto 24-26 twice per day, elequis 5 mg twice per day. New scripts sent to [facility]. *If you need a refill on your cardiac medications before your next appointment, please call your pharmacy*   Lab Work: BMET, BNP, CNC due next Thursday 03/20/23 non fasting. If you have labs (blood work) drawn today and your tests are completely normal, you will receive your results only by: MyChart Message (if you have MyChart) OR A paper copy in the mail If you have any lab test that is abnormal or we need to change your treatment, we will call you to review the results.   Testing/Procedures: Your physician has requested that you have an echocardiogram at [facility] office please in next 2 weeks prior to follow up appointment with A [facility] PA-C.  Echocardiography is a painless test that uses sound waves to create images of your heart. It provides your doctor with information about the size and shape of your heart and how well your heart's chambers and valves are working. This procedure takes approximately one hour. There are no restrictions for this procedure. Please do NOT wear cologne, perfume, aftershave, or lotions (deodorant is allowed). Please arrive 15 minutes prior to your appointment time.  Please note: We ask at that you not bring children with you during ultrasound (echo/ vascular) testing. Due to room size and safety concerns, children are not allowed in the ultrasound rooms during exams. Our front office staff cannot provide observation of children in our lobby area while testing is being conducted. An adult accompanying a patient to their appointment will only be allowed in the ultrasound room at the discretion of the ultrasound technician under special circumstances. We apologize for any inconvenience.    Follow-Up: At [facility], you and your  health needs are our priority.  As part of our continuing mission to provide you with exceptional heart care, we have created designated Provider Care Teams.  These Care Teams include your primary Cardiologist (physician) and Advanced Practice Providers (APPs -  Physician Assistants and Nurse Practitioners) who all work together to provide you with the care you need, when you need it.  We recommend signing up for the patient portal called "MyChart".  Sign up information is provided on this After Visit Summary.  MyChart is used to connect with patients for Virtual Visits (Telemedicine).  Patients are able to view lab/test results, encounter notes, upcoming appointments, etc.  Non-urgent messages can be sent to your provider as well.   To learn more about what you can do with MyChart, go to [email or web address].    Your next appointment:   2 week(s)  Provider:   Micah Flesher, PA-C        Other Instructions

## 2023-03-14 NOTE — Telephone Encounter (Signed)
Pt was prescribed medication metoprolol. Pharmacy sent back a statement stating. Pharmacy comment: We have this drug listed in patients profile as to having a severe allergy.  Please advise.  Thank you.  Would Micah Flesher, PA like for pt to still take medication and is she aware of this allergy? Please address

## 2023-03-17 ENCOUNTER — Ambulatory Visit: Payer: Medicare HMO | Admitting: Physician Assistant

## 2023-03-20 ENCOUNTER — Ambulatory Visit (HOSPITAL_COMMUNITY): Payer: Medicare HMO | Attending: Cardiovascular Disease

## 2023-03-20 DIAGNOSIS — I4891 Unspecified atrial fibrillation: Secondary | ICD-10-CM

## 2023-03-20 DIAGNOSIS — I5023 Acute on chronic systolic (congestive) heart failure: Secondary | ICD-10-CM | POA: Diagnosis not present

## 2023-03-20 DIAGNOSIS — I350 Nonrheumatic aortic (valve) stenosis: Secondary | ICD-10-CM | POA: Diagnosis not present

## 2023-03-20 LAB — ECHOCARDIOGRAM COMPLETE
AR max vel: 1.11 cm2
AV Area VTI: 1.24 cm2
AV Area mean vel: 1.2 cm2
AV Mean grad: 12.7 mm[Hg]
AV Peak grad: 22.8 mm[Hg]
Ao pk vel: 2.39 m/s
MV M vel: 4.63 m/s
MV Peak grad: 85.9 mm[Hg]
S' Lateral: 3.9 cm

## 2023-03-20 LAB — CBC

## 2023-03-21 DIAGNOSIS — M25462 Effusion, left knee: Secondary | ICD-10-CM | POA: Diagnosis not present

## 2023-03-21 LAB — BASIC METABOLIC PANEL
BUN/Creatinine Ratio: 15 (ref 10–24)
BUN: 19 mg/dL (ref 8–27)
CO2: 19 mmol/L — ABNORMAL LOW (ref 20–29)
Calcium: 8.9 mg/dL (ref 8.6–10.2)
Chloride: 104 mmol/L (ref 96–106)
Creatinine, Ser: 1.24 mg/dL (ref 0.76–1.27)
Glucose: 96 mg/dL (ref 70–99)
Potassium: 4.3 mmol/L (ref 3.5–5.2)
Sodium: 140 mmol/L (ref 134–144)
eGFR: 58 mL/min/{1.73_m2} — ABNORMAL LOW (ref >59)

## 2023-03-21 LAB — CBC
Hematocrit: 35.4 % — ABNORMAL LOW (ref 37.5–51.0)
Hemoglobin: 10.3 g/dL — ABNORMAL LOW (ref 13.0–17.7)
MCH: 21.5 pg — ABNORMAL LOW (ref 26.6–33.0)
MCHC: 29.1 g/dL — ABNORMAL LOW (ref 31.5–35.7)
MCV: 74 fL — ABNORMAL LOW (ref 79–97)
Platelets: 431 10*3/uL (ref 150–450)
RBC: 4.78 x10E6/uL (ref 4.14–5.80)
RDW: 16.7 % — ABNORMAL HIGH (ref 11.6–15.4)
WBC: 12 10*3/uL — ABNORMAL HIGH (ref 3.4–10.8)

## 2023-03-21 LAB — BRAIN NATRIURETIC PEPTIDE: BNP: 128.5 pg/mL — ABNORMAL HIGH (ref 0.0–100.0)

## 2023-03-21 NOTE — Progress Notes (Signed)
 Cardiology Office Note:    Date:  04/01/2023   ID:  Joseph [name], DOB 21-Sep-1940, MRN [id number]  PCP:  Debroah Loop, DO   [facility] Providers Cardiologist:  Bryan Lemma, MD Cardiology APP:  Marcelino Duster, PA     Referring MD: No ref. provider found   Chief Complaint  Patient presents with   Follow-up    CHD, AFIB    History of Present Illness:    Joseph [name] is a 83 y.o. 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  Amiodarone subsequently stopped 11/2016 and Coumadin stopped 01/2017.  He has maintained normal sinus rhythm.  He understands SBE prophylaxis.  Echocardiogram following COVID-pneumonia in 12/2021 showed an LVEF 40-45%-similar to his last echo.  Good aortic valve function.  He called our office 03/11/2023 with shortness of breath and was started on Lasix 40 mg along with 20 mEq potassium supplementation.  He was added to my schedule. During my interview 03/14/23 he reports that he has been sleeping a lot, denies chest pain or edema.  proBNP was 1426.  EKG that day with atrial fibrillation that was rate controlled in the 90s.  Had high suspicion that new onset A-fib may be contributing to his heart failure symptoms.   I escalated his GDMT to include 40 mg Lasix daily, 24-26 mg Entresto twice daily, and 25 mg Lopressor twice daily.  I opted to stop Cardizem given his history of reduced EF.  I started him on anticoagulation with 5 mg Eliquis twice daily.  Of note, he has listed allergies to atenolol and losartan, both of which he did not recall.  Repeat echocardiogram showed an LVEF of 35-40%, mild LVH, moderately reduced RV function, moderate MR, AVR with good function.   He returns today for follow up. He feels significantly improved. He has been having what sounds like ocular migraine - 2-3 times in the past 2 days, he has had these in the past. He also  started having dizziness 2-3 days ago - dizziness occurs without position changes and is brief, no syncope. He has also had intermittent blurry vision, which always resolves. I think he is a bit dry on exam.   He remains in Afib today.    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HCC)    03-03-14 had VP shunt inserted   Pre-diabetes    Rupture of left quadriceps tendon 07/21/2014   Skin cancer, basal cell    Urinary incontinence     Past Surgical History:  Procedure Laterality Date   ANAL FISSURE REPAIR     AORTIC VALVE REPLACEMENT N/A 09/20/2016   Procedure: AORTIC VALVE REPLACEMENT (AVR) USING MAGNA EASE 23 MM PERICARDIAL TISSUE HEART VALVE MODEL 3300TFX;  Surgeon: Alleen Borne, MD;  Location: [facility] OR;  Service: Open Heart Surgery;  Laterality: N/A;   BACK SURGERY     CARDIAC CATHETERIZATION     COLONOSCOPY W/ POLYPECTOMY     CORONARY ARTERY BYPASS GRAFT N/A 09/20/2016   Procedure:  CORONARY ARTERY BYPASS GRAFTING (CABG)x 3 WITH ENDOSCOPIC HARVESTING OF RIGHT SAPHENOUS VEIN;  Surgeon: Alleen Borne, MD;  Location: [facility] OR;  Service: Open Heart Surgery;  Laterality: N/A;   ELBOW SURGERY Right    FINGER SURGERY     left shoulder RTC tear     LEG SURGERY     tendon repair right leg   MICRODISCECTOMY LUMBAR     l5-s1   QUADRICEPS TENDON REPAIR Left 07/21/2014   Procedure: REPAIR LEFT QUADRICEP TENDON;  Surgeon: Teryl Lucy, MD;  Location: [facility];  Service: Orthopedics;  Laterality: Left;   RIGHT/LEFT HEART CATH AND CORONARY ANGIOGRAPHY N/A 09/03/2016   Procedure: RIGHT/LEFT HEART CATH AND CORONARY ANGIOGRAPHY;  Surgeon: Corky Crafts, MD;  Location: [facility] INVASIVE CV LAB;  Service: Cardiovascular;  Laterality: N/A;   TEE WITHOUT CARDIOVERSION N/A 09/20/2016   Procedure: TRANSESOPHAGEAL ECHOCARDIOGRAM (TEE);  Surgeon: Alleen Borne, MD;  Location: [facility] OR;  Service: Open Heart Surgery;  Laterality: N/A;   TONSILLECTOMY     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   Procedure: SHUNT INSERTION VENTRICULAR-PERITONEAL;  Surgeon: Tia Alert, MD;  Location: [facility] NEURO ORS;  Service: Neurosurgery;  Laterality: Right;    Current Medications: Current Meds  Medication Sig   acetaminophen (TYLENOL) 500 MG tablet Take 500-1,000 mg by mouth 4 (four) times daily as needed for moderate pain.    ammonium lactate (AMLACTIN) 12 % cream Apply topically as needed.   amoxicillin (AMOXIL) 500 MG capsule Take 500 mg by mouth See admin instructions. Take 4 capsules 1 hour prior to dental procedures   apixaban (ELIQUIS) 5 MG TABS tablet Take 1 tablet (5 mg total) by mouth 2 (two) times daily.   atorvastatin (LIPITOR) 10 MG tablet TAKE ONE TABLET BY MOUTH DAILY   clindamycin (CLEOCIN T) 1 % external solution Apply topically.   docusate sodium (COLACE) 100 MG capsule Take 100 mg by mouth daily.    econazole nitrate 1 % cream Apply topically.   GEMTESA 75 MG TABS Take 1 tablet by mouth every morning.   hydrALAZINE (APRESOLINE) 25 MG tablet TAKE ONE TABLET BY MOUTH THREE TIMES DAILY   ketoconazole (NIZORAL) 2 % shampoo Apply 1 application topically 2 (two) times a week.   lansoprazole (PREVACID) 30 MG capsule Take 30 mg by mouth 2 (two) times daily.    metoprolol tartrate (LOPRESSOR) 25 MG tablet Take 1 tablet (25 mg total) by mouth 2 (two) times daily.   metroNIDAZOLE (METROCREAM) 0.75 % cream Apply 1 application topically 2 (two) times daily.   nabumetone (RELAFEN) 750 MG tablet Take 1,500 mg by mouth daily.    psyllium (REGULOID) 0.52 g capsule Take 1.56 g by mouth daily.   sacubitril-valsartan (ENTRESTO) 24-26 MG Take 1 tablet by  mouth 2 (two) times daily.   spironolactone (ALDACTONE) 25 MG tablet Take 0.5 tablets (12.5 mg total) by mouth daily.   tolterodine (DETROL LA) 2 MG 24 hr capsule Take 2 mg by mouth every morning.   [DISCONTINUED] furosemide (LASIX) 40 MG tablet Take 1 tablet (40 mg total) by mouth daily.   [DISCONTINUED] potassium chloride SA (KLOR-CON M20) 20 MEQ tablet Take 1 tablet (20 mEq total) by mouth daily.     Allergies:   Ace inhibitors, Atenolol, Beta adrenergic blockers, Erythromycin, and Ciprofloxacin   Social History   Socioeconomic History   Marital status: Married    Spouse name: Not on file   Number of children: Not on file   Years of  education: Not on file   Highest education level: Not on file  Occupational History   Not on file  Tobacco Use   Smoking status: Former    Types: Cigarettes, Cigars, Pipe   Smokeless tobacco: Never   Tobacco comments:    Quit in the 80's  Vaping Use   Vaping status: Never Used  Substance and Sexual Activity   Alcohol use: Yes    Alcohol/week: 2.0 - 3.0 standard drinks of alcohol    Types: 2 - 3 Glasses of wine per week    Comment: occasional 1-2 times week   Drug use: No   Sexual activity: Not Currently  Other Topics Concern   Not on file  Social History Narrative   Not on file   Social Drivers of Health   Financial Resource Strain: Not on file  Food Insecurity: Not on file  Transportation Needs: Not on file  Physical Activity: Not on file  Stress: Not on file  Social Connections: Not on file     Family History: The patient's family history includes Heart attack in his maternal grandmother; Heart disease in his mother. There is no history of Hypertension or Stroke.  ROS:   Please see the history of present illness.     All other systems reviewed and are negative.  EKGs/Labs/Other Studies Reviewed:    The following studies were reviewed today:  Echo 03/20/23:  1. Left ventricular ejection fraction, by estimation, is 35 to 40%.  Left  ventricular ejection fraction by 3D volume is 34 %. The left ventricle has  moderately decreased function. The left ventricle demonstrates global  hypokinesis. There is mild left  ventricular hypertrophy. Left ventricular diastolic function could not be  evaluated. The average left ventricular global longitudinal strain is  -12.0 %. The global longitudinal strain is abnormal.   2. Right ventricular systolic function mild to moderately reduced. The  right ventricular size is normal. Tricuspid regurgitation signal is  inadequate for assessing PA pressure.   3. The mitral valve is degenerative. Moderate mitral valve regurgitation.  No evidence of mitral stenosis. Moderate mitral annular calcification.   4. S/P 23mm Magna Bioprosthetic aortic valve, implant date 09/20/2016,  not well seen, no valvular regurgitation, no valvular stenosis (peak  velocity 2.6m/s, PG 22.8mmHG, MG 12.7 mmHG, DI 0.30, AT < ).   5. The inferior vena cava is normal in size with greater than 50%  respiratory variability, suggesting right atrial pressure of 3 mmHg.         Recent Labs: 03/11/2023: NT-Pro BNP 1,426 03/20/2023: BNP 128.5; BUN 19; Creatinine, Ser 1.24; Hemoglobin 10.3; Platelets 431; Potassium 4.3; Sodium 140  Recent Lipid Panel    Component Value Date/Time   CHOL 130 01/31/2017 0855   TRIG 110 01/31/2017 0855   HDL 62 01/31/2017 0855   CHOLHDL 2.1 01/31/2017 0855   LDLCALC 46 01/31/2017 0855     Risk Assessment/Calculations:    CHA2DS2-VASc Score = 5   This indicates a 7.2% annual risk of stroke. The patient's score is based upon: CHF History: 1 HTN History: 1 Diabetes History: 0 Stroke History: 0 Vascular Disease History: 1 Age Score: 2 Gender Score: 0            Physical Exam:    VS:  BP 120/72 (BP Location: Right Arm, Patient Position: Sitting, Cuff Size: Normal)   Pulse 92   Ht 5\' 9"  (1.753 m)   Wt 223 lb 6.4 oz (101.3 kg)   SpO2 97%  BMI 32.99 kg/m     Wt  Readings from Last 3 Encounters:  04/01/23 223 lb 6.4 oz (101.3 kg)  03/14/23 242 lb (109.8 kg)  03/12/22 245 lb (111.1 kg)     GEN:  Well nourished, well developed in no acute distress HEENT: Normal NECK: No JVD; No carotid bruits LYMPHATICS: No lymphadenopathy CARDIAC: RRR, no murmurs, rubs, gallops RESPIRATORY:  Clear to auscultation without rales, wheezing or rhonchi  ABDOMEN: Soft, non-tender, non-distended MUSCULOSKELETAL:  No edema; No deformity  SKIN: Warm and dry NEUROLOGIC:  Alert and oriented x 3 PSYCHIATRIC:  Normal affect   ASSESSMENT:    1. Atrial fibrillation, unspecified type (HCC)   2. Chronic anticoagulation   3. S/P CABG (coronary artery bypass graft)   4. Coronary artery disease involving native coronary artery of native heart without angina pectoris   5. Primary hypertension   6. Aortic valve stenosis, etiology of cardiac valve disease unspecified   7. S/P AVR   8. Hyperlipidemia with target LDL less than 70    PLAN:    In order of problems listed above:  Shortness of breath - improved Chronic systolic heart failure - LVEF historically 40-45% --> now 35-40% with RV dysfunction and MR - at last visit, I discontinue 240 mg Cardizem - Start 40 mg Lasix daily, 24-26 mg Entresto twice daily, 25 mg Lopressor twice daily -- much improved, will change lasix to PRN, stop potassium, start 12.5 mg spironolactone - recheck BMP and CBC in 1 week on 2/18, then DCCV on 03/13/23.  - I suspect dizziness and ocular migraines may signal overdiuresis - if persistent, will send to neurology, grossly neurologically intact   Atrial fibrillation History of postop A-fib after his CABG in 2018 -Is now converted back to A-fib -Rate controlled with metoprolol 25 mg twice daily -Now anticoagulated with 5 mg Eliquis twice daily - he is now euvolemic, will plan for outpatient DCCV   CAD s/p CABG 2018 -Continue aspirin  - no chest pain   Aortic stenosis s/p AVR  2018 -Good valve function on echocardiogram 2023 -SBE prophylaxis   Hypertension - BP well controlled, managed in the context of CHF   Hyperlipidemia with LDL goal less than 70 - Continue Lipitor   Morbid obesity - Working on weight loss   Given reduced LVEF along with moderately reduced RV, I suspect this may be related to recurrence of Afib. He is now rate controlled and euvolemic. Will further titrate GDMT with spironolactone, recheck labs, and then plan for DCCV without TEE on 04/10/23 - no missed doses of eliquis. Plan to recheck LVEF following DCCV and on max GDMT. Echo reviewed  with Dr. Herbie Baltimore and with Dr. Royann Shivers, low suspicion for PE.       Informed Consent   Shared Decision Making/Informed Consent The risks (stroke, cardiac arrhythmias rarely resulting in the need for a temporary or permanent pacemaker, skin irritation or burns and complications associated with conscious sedation including aspiration, arrhythmia, respiratory failure and death), benefits (restoration of normal sinus rhythm) and alternatives of a direct current cardioversion were explained in detail to Mr. Sausedo and he agrees to proceed.         Medication Adjustments/Labs and Tests Ordered: Current medicines are reviewed at length with the patient today.  Concerns regarding medicines are outlined above.  Orders Placed This Encounter  Procedures   Basic Metabolic Panel (BMET)   CBC   EKG 12-Lead   Meds ordered this encounter  Medications   spironolactone (  ALDACTONE) 25 MG tablet    Sig: Take 0.5 tablets (12.5 mg total) by mouth daily.    Dispense:  45 tablet    Refill:  3    Patient Instructions  Medication Instructions:  STOP Lasix  STOP Potassium   START Spironolactone 12.5 mg daily  *If you need a refill on your cardiac medications before your next appointment, please call your pharmacy*   Lab Work: BMET in one week (March 18th) CBC in one week (March 18th)  If you have labs  (blood work) drawn today and your tests are completely normal, you will receive your results only by: MyChart Message (if you have MyChart) OR A paper copy in the mail If you have any lab test that is abnormal or we need to change your treatment, we will call you to review the results.   Testing/Procedures:     Dear Joseph [name]  You are scheduled for a Cardioversion on Thursday, March 20 with Dr. Jens Som.  Please arrive at the [facility] (Main Entrance A) at [facility]: [address] [place], [place] [address] at 9:00 AM (This time is 1 hour(s) before your procedure to ensure your preparation).   Free valet parking service is available. You will check in at ADMITTING.   DIET:  Nothing to eat or drink after midnight except a sip of water with medications (see medication instructions below)   LABS: CBC/ BMET on Tuesday April 08, 2023  Continue taking your anticoagulant (blood thinner): Apixaban (Eliquis).  You will need to continue this after your procedure until you are told by your provider that it is safe to stop.    FYI:  For your safety, and to allow [place] to monitor your vital signs accurately during the surgery/procedure we request: If you have artificial nails, gel coating, SNS etc, please have those removed prior to your surgery/procedure. Not having the nail coverings /polish removed may result in cancellation or delay of your surgery/procedure.  Your support person will be asked to wait in the waiting room during your procedure.  It is OK to have someone drop you off and come back when you are ready to be discharged.  You cannot drive after the procedure and will need someone to drive you home.  Bring your insurance cards.  *Special Note: Every effort is made to have your procedure done on time. Occasionally there are emergencies that occur at the hospital that may cause delays. Please be patient if a delay does occur.    Follow-Up: At [facility],  you and your health needs are our priority.  As part of our continuing mission to provide you with exceptional heart care, we have created designated Provider Care Teams.  These Care Teams include your primary Cardiologist (physician) and Advanced Practice Providers (APPs -  Physician Assistants and Nurse Practitioners) who all work together to provide you with the care you need, when you need it.  We recommend signing up for the patient portal called "MyChart".  Sign up information is provided on this After Visit Summary.  MyChart is used to connect with patients for Virtual Visits (Telemedicine).  Patients are able to view lab/test results, encounter notes, upcoming appointments, etc.  Non-urgent messages can be sent to your provider as well.   To learn more about what you can do with MyChart, go to [email or web address].    Your next appointment:   April 16, 2023 at 2:45 pm  Provider:   Micah Flesher PA  Other Instructions Take 40 mg Lasix for 3 lb weight gain       Signed, Marcelino Duster, [place]  04/01/2023 5:08 PM    [facility]

## 2023-03-25 ENCOUNTER — Ambulatory Visit: Payer: Medicare HMO | Admitting: Physician Assistant

## 2023-03-28 ENCOUNTER — Ambulatory Visit: Payer: Medicare HMO | Admitting: Physician Assistant

## 2023-03-31 DIAGNOSIS — N3941 Urge incontinence: Secondary | ICD-10-CM | POA: Diagnosis not present

## 2023-03-31 DIAGNOSIS — N3281 Overactive bladder: Secondary | ICD-10-CM | POA: Diagnosis not present

## 2023-04-01 ENCOUNTER — Encounter: Payer: Self-pay | Admitting: Physician Assistant

## 2023-04-01 ENCOUNTER — Ambulatory Visit: Payer: Medicare HMO | Attending: Physician Assistant | Admitting: Physician Assistant

## 2023-04-01 VITALS — BP 120/72 | HR 92 | Ht 69.0 in | Wt 223.4 lb

## 2023-04-01 DIAGNOSIS — I350 Nonrheumatic aortic (valve) stenosis: Secondary | ICD-10-CM | POA: Diagnosis not present

## 2023-04-01 DIAGNOSIS — Z951 Presence of aortocoronary bypass graft: Secondary | ICD-10-CM

## 2023-04-01 DIAGNOSIS — E785 Hyperlipidemia, unspecified: Secondary | ICD-10-CM

## 2023-04-01 DIAGNOSIS — I10 Essential (primary) hypertension: Secondary | ICD-10-CM | POA: Diagnosis not present

## 2023-04-01 DIAGNOSIS — I2510 Atherosclerotic heart disease of native coronary artery without angina pectoris: Secondary | ICD-10-CM | POA: Diagnosis not present

## 2023-04-01 DIAGNOSIS — I4891 Unspecified atrial fibrillation: Secondary | ICD-10-CM

## 2023-04-01 DIAGNOSIS — Z952 Presence of prosthetic heart valve: Secondary | ICD-10-CM | POA: Diagnosis not present

## 2023-04-01 DIAGNOSIS — Z7901 Long term (current) use of anticoagulants: Secondary | ICD-10-CM

## 2023-04-01 MED ORDER — SPIRONOLACTONE 25 MG PO TABS: 12.5000 mg | ORAL_TABLET | Freq: Every day | ORAL | 3 refills | Status: DC

## 2023-04-01 NOTE — Patient Instructions (Addendum)
 Medication Instructions:  STOP Lasix  STOP Potassium   START Spironolactone 12.5 mg daily  *If you need a refill on your cardiac medications before your next appointment, please call your pharmacy*   Lab Work: BMET in one week (March 18th) CBC in one week (March 18th)  If you have labs (blood work) drawn today and your tests are completely normal, you will receive your results only by: MyChart Message (if you have MyChart) OR A paper copy in the mail If you have any lab test that is abnormal or we need to change your treatment, we will call you to review the results.   Testing/Procedures:     Dear Joseph [name]  You are scheduled for a Cardioversion on Thursday, March 20 with Dr. Jens Som.  Please arrive at the [facility] (Main Entrance A) at [facility]: [address] [place], [place] [address] at 9:00 AM (This time is 1 hour(s) before your procedure to ensure your preparation).   Free valet parking service is available. You will check in at ADMITTING.   DIET:  Nothing to eat or drink after midnight except a sip of water with medications (see medication instructions below)   LABS: CBC/ BMET on Tuesday April 08, 2023  Continue taking your anticoagulant (blood thinner): Apixaban (Eliquis).  You will need to continue this after your procedure until you are told by your provider that it is safe to stop.    FYI:  For your safety, and to allow [place] to monitor your vital signs accurately during the surgery/procedure we request: If you have artificial nails, gel coating, SNS etc, please have those removed prior to your surgery/procedure. Not having the nail coverings /polish removed may result in cancellation or delay of your surgery/procedure.  Your support person will be asked to wait in the waiting room during your procedure.  It is OK to have someone drop you off and come back when you are ready to be discharged.  You cannot drive after the procedure and will need  someone to drive you home.  Bring your insurance cards.  *Special Note: Every effort is made to have your procedure done on time. Occasionally there are emergencies that occur at the hospital that may cause delays. Please be patient if a delay does occur.    Follow-Up: At [facility], you and your health needs are our priority.  As part of our continuing mission to provide you with exceptional heart care, we have created designated Provider Care Teams.  These Care Teams include your primary Cardiologist (physician) and Advanced Practice Providers (APPs -  Physician Assistants and Nurse Practitioners) who all work together to provide you with the care you need, when you need it.  We recommend signing up for the patient portal called "MyChart".  Sign up information is provided on this After Visit Summary.  MyChart is used to connect with patients for Virtual Visits (Telemedicine).  Patients are able to view lab/test results, encounter notes, upcoming appointments, etc.  Non-urgent messages can be sent to your provider as well.   To learn more about what you can do with MyChart, go to [email or web address].    Your next appointment:   April 16, 2023 at 2:45 pm  Provider:   Micah Flesher PA  Other Instructions Take 40 mg Lasix for 3 lb weight gain

## 2023-04-08 DIAGNOSIS — Z7901 Long term (current) use of anticoagulants: Secondary | ICD-10-CM | POA: Diagnosis not present

## 2023-04-08 DIAGNOSIS — I38 Endocarditis, valve unspecified: Secondary | ICD-10-CM | POA: Diagnosis not present

## 2023-04-08 DIAGNOSIS — I4891 Unspecified atrial fibrillation: Secondary | ICD-10-CM | POA: Diagnosis not present

## 2023-04-08 DIAGNOSIS — Z01810 Encounter for preprocedural cardiovascular examination: Secondary | ICD-10-CM | POA: Diagnosis not present

## 2023-04-08 LAB — CBC

## 2023-04-09 LAB — CBC
Hematocrit: 36 % — ABNORMAL LOW (ref 37.5–51.0)
Hemoglobin: 10.5 g/dL — ABNORMAL LOW (ref 13.0–17.7)
MCH: 21.7 pg — ABNORMAL LOW (ref 26.6–33.0)
MCHC: 29.2 g/dL — ABNORMAL LOW (ref 31.5–35.7)
MCV: 75 fL — ABNORMAL LOW (ref 79–97)
Platelets: 360 10*3/uL (ref 150–450)
RBC: 4.83 x10E6/uL (ref 4.14–5.80)
RDW: 17 % — ABNORMAL HIGH (ref 11.6–15.4)
WBC: 8.8 10*3/uL (ref 3.4–10.8)

## 2023-04-09 LAB — BASIC METABOLIC PANEL
BUN/Creatinine Ratio: 12 (ref 10–24)
BUN: 16 mg/dL (ref 8–27)
CO2: 18 mmol/L — ABNORMAL LOW (ref 20–29)
Calcium: 9 mg/dL (ref 8.6–10.2)
Chloride: 108 mmol/L — ABNORMAL HIGH (ref 96–106)
Creatinine, Ser: 1.31 mg/dL — ABNORMAL HIGH (ref 0.76–1.27)
Glucose: 94 mg/dL (ref 70–99)
Potassium: 4.5 mmol/L (ref 3.5–5.2)
Sodium: 142 mmol/L (ref 134–144)
eGFR: 55 mL/min/{1.73_m2} — ABNORMAL LOW (ref >59)

## 2023-04-09 NOTE — Progress Notes (Signed)
 Left detailed VM for  patient and instructed them to come at 0930  and to be NPO after 0000.  Medications reviewed and patient instructed.    Advised that patient to have a ride home and someone to stay with them for 24 hours after the procedure.   Left number to call back if they have additional instructions.

## 2023-04-09 NOTE — Progress Notes (Signed)
 Cardiology Office Note:    Date:  04/16/2023   ID:  Joseph [name], DOB March 07, 1940, MRN [id number]  PCP:  Debroah Loop, DO   [facility] Providers Cardiologist:  Bryan Lemma, MD Cardiology APP:  Marcelino Duster, PA     Referring MD: Debroah Loop, DO   Chief Complaint  Patient presents with   Follow-up    Post DCCV    History of Present Illness:    Joseph [name] is a 83 y.o. 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  Amiodarone subsequently stopped 11/2016 and Coumadin stopped 01/2017.  He has maintained normal sinus rhythm.  He understands SBE prophylaxis.  Echocardiogram following COVID-pneumonia in 12/2021 showed an LVEF 40-45%-similar to his last echo.  Good aortic valve function.  He called our office 03/11/2023 with shortness of breath and was started on Lasix 40 mg along with 20 mEq potassium supplementation.  He was added to my schedule. During my interview 03/14/23 he reports that he has been sleeping a lot, denies chest pain or edema.  proBNP was 1426.  EKG that day with atrial fibrillation that was rate controlled in the 90s.  Had high suspicion that new onset A-fib may be contributing to his heart failure symptoms.   I escalated his GDMT to include 40 mg Lasix daily, 24-26 mg Entresto twice daily, and 25 mg Lopressor twice daily.  I opted to stop Cardizem given his history of reduced EF.  I started him on anticoagulation with 5 mg Eliquis twice daily.  Of note, he has listed allergies to atenolol and losartan, both of which he did not recall.  Repeat echocardiogram showed an LVEF of 35-40%, mild LVH, moderately reduced RV function, moderate MR, AVR with good function.   He did well with medication changes but remained in rate controlled atrial fibrillation.  I added 12.5 mg spironolactone and stopped his Lasix/potassium.  Follow-up BMP stable.  He proceeded  to DCCV after at least 3 weeks of oral anticoagulation.  He was cardioverted to sinus rhythm on 04/10/2023.  He returns today for follow-up after cardioversion.  He feels well and shows me stable dry weights in the 228 to 230 pound range.  He is not taking Lasix regularly.  He is doing well on his current medication regimen.  He does have 2 PVCs on today's EKG.  He is unaware of his PVCs.  I will check magnesium and TSH today.  I will consolidate Lopressor to Toprol.   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HCC)    03-03-14 had VP shunt inserted   Pre-diabetes    Rupture of left quadriceps tendon 07/21/2014   Skin cancer, basal cell    Urinary incontinence     Past Surgical History:  Procedure Laterality Date   ANAL FISSURE REPAIR     AORTIC VALVE REPLACEMENT N/A 09/20/2016   Procedure: AORTIC VALVE REPLACEMENT (AVR) USING MAGNA EASE 23 MM PERICARDIAL TISSUE HEART VALVE MODEL 3300TFX;  Surgeon: Alleen Borne, MD;  Location: [facility] OR;  Service: Open Heart Surgery;  Laterality: N/A;   BACK SURGERY     CARDIAC CATHETERIZATION     CARDIOVERSION N/A 04/10/2023   Procedure: CARDIOVERSION;  Surgeon: Lewayne Bunting, MD;  Location: [facility] INVASIVE CV LAB;  Service: Cardiovascular;  Laterality: N/A;   COLONOSCOPY W/ POLYPECTOMY     CORONARY ARTERY BYPASS GRAFT N/A 09/20/2016   Procedure: CORONARY ARTERY BYPASS GRAFTING (CABG)x 3 WITH ENDOSCOPIC HARVESTING OF RIGHT SAPHENOUS VEIN;  Surgeon: Alleen Borne, MD;  Location: [facility] OR;  Service: Open Heart Surgery;  Laterality: N/A;   ELBOW SURGERY Right    FINGER SURGERY     left shoulder RTC tear     LEG SURGERY     tendon  repair right leg   MICRODISCECTOMY LUMBAR     l5-s1   QUADRICEPS TENDON REPAIR Left 07/21/2014   Procedure: REPAIR LEFT QUADRICEP TENDON;  Surgeon: Teryl Lucy, MD;  Location: [facility];  Service: Orthopedics;  Laterality: Left;   RIGHT/LEFT HEART CATH AND CORONARY ANGIOGRAPHY N/A 09/03/2016   Procedure: RIGHT/LEFT HEART CATH AND CORONARY ANGIOGRAPHY;  Surgeon: Corky Crafts, MD;  Location: [facility] INVASIVE CV LAB;  Service: Cardiovascular;  Laterality: N/A;   TEE WITHOUT CARDIOVERSION N/A 09/20/2016   Procedure: TRANSESOPHAGEAL ECHOCARDIOGRAM (TEE);  Surgeon: Alleen Borne, MD;  Location: [facility] OR;  Service: Open Heart Surgery;  Laterality: N/A;   TONSILLECTOMY     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   Procedure: SHUNT INSERTION VENTRICULAR-PERITONEAL;  Surgeon: Tia Alert, MD;  Location: [facility] NEURO ORS;  Service: Neurosurgery;  Laterality: Right;    Current Medications: Current Meds  Medication Sig   acetaminophen (TYLENOL) 500 MG tablet Take 500-1,000 mg by mouth 4 (four) times daily as needed for moderate pain.    ammonium lactate (AMLACTIN) 12 % cream Apply 1 Application topically as needed for dry skin.   amoxicillin (AMOXIL) 500 MG capsule Take 500 mg by mouth See admin instructions. Take 4 capsules 1 hour prior to dental procedures   apixaban (ELIQUIS) 5 MG TABS tablet Take 1 tablet (5 mg total) by mouth 2 (two) times daily.   atorvastatin (LIPITOR) 10 MG tablet TAKE ONE TABLET BY MOUTH DAILY   clindamycin (CLEOCIN T) 1 % external solution Apply 1 Application topically 2 (two) times daily as needed (Rosacea).   GEMTESA 75 MG TABS Take 1 tablet by mouth every morning.   hydrALAZINE (APRESOLINE) 25 MG tablet TAKE ONE TABLET BY MOUTH THREE TIMES DAILY   ketoconazole (NIZORAL) 2 % shampoo Apply 1 application  topically once a week.   lansoprazole (PREVACID) 30 MG capsule Take 30 mg by mouth 2 (two) times daily.    metoprolol succinate (TOPROL XL) 50 MG 24 hr  tablet Take with or immediately following a meal at night   metroNIDAZOLE (METROCREAM) 0.75 % cream Apply 1 application  topically 2 (two) times daily as needed (rosacea).   nabumetone (RELAFEN) 750 MG tablet Take 1,500 mg by mouth daily.    psyllium (REGULOID) 0.52 g capsule Take 1.56 g by mouth daily.   sacubitril-valsartan (ENTRESTO) 24-26 MG Take 1 tablet by mouth 2 (two) times daily.   spironolactone (ALDACTONE) 25 MG tablet Take 0.5 tablets (12.5 mg total) by mouth daily.   tolterodine (DETROL LA) 2 MG 24 hr capsule Take 2 mg by mouth every morning.   [DISCONTINUED] metoprolol tartrate (LOPRESSOR) 25 MG tablet Take 1 tablet (25 mg total) by mouth 2 (two) times daily.   [DISCONTINUED] metoprolol  tartrate (LOPRESSOR) 50 MG tablet Take 1 tablet at night     Allergies:   Ace inhibitors, Atenolol, Beta adrenergic blockers, Erythromycin, and Ciprofloxacin   Social History   Socioeconomic History   Marital status: Married    Spouse name: Not on file   Number of children: Not on file   Years of education: Not on file   Highest education level: Not on file  Occupational History   Not on file  Tobacco Use   Smoking status: Former    Types: Cigarettes, Cigars, Pipe   Smokeless tobacco: Never   Tobacco comments:    Quit in the 80's  Vaping Use   Vaping status: Never Used  Substance and Sexual Activity   Alcohol use: Yes    Alcohol/week: 2.0 - 3.0 standard drinks of alcohol    Types: 2 - 3 Glasses of wine per week    Comment: occasional 1-2 times week   Drug use: No   Sexual activity: Not Currently  Other Topics Concern   Not on file  Social History Narrative   Not on file   Social Drivers of Health   Financial Resource Strain: Not on file  Food Insecurity: Not on file  Transportation Needs: Not on file  Physical Activity: Not on file  Stress: Not on file  Social Connections: Not on file     Family History: The patient's family history includes Heart attack in his  maternal grandmother; Heart disease in his mother. There is no history of Hypertension or Stroke.  ROS:   Please see the history of present illness.     All other systems reviewed and are negative.  EKGs/Labs/Other Studies Reviewed:    The following studies were reviewed today:  EKG Interpretation Date/Time:  Wednesday April 16 2023 14:49:44 EDT Ventricular Rate:  72 PR Interval:  196 QRS Duration:  108 QT Interval:  412 QTC Calculation: 451 R Axis:   45  Text Interpretation: Sinus rhythm with occasional Premature ventricular complexes Nonspecific ST and T wave abnormality When compared with ECG of 10-Apr-2023 10:44, Sinus rhythm has replaced Atrial fibrillation Incomplete left bundle branch block is no longer Present Nonspecific T wave abnormality now evident in Lateral leads Confirmed by Micah Flesher ([id number]) on 04/16/2023 3:05:51 PM    Recent Labs: 03/11/2023: NT-Pro BNP 1,426 03/20/2023: BNP 128.5 04/08/2023: BUN 16; Creatinine, Ser 1.31; Hemoglobin 10.5; Platelets 360; Potassium 4.5; Sodium 142  Recent Lipid Panel    Component Value Date/Time   CHOL 130 01/31/2017 0855   TRIG 110 01/31/2017 0855   HDL 62 01/31/2017 0855   CHOLHDL 2.1 01/31/2017 0855   LDLCALC 46 01/31/2017 0855     Risk Assessment/Calculations:    CHA2DS2-VASc Score = 5   This indicates a 7.2% annual risk of stroke. The patient's score is based upon: CHF History: 1 HTN History: 1 Diabetes History: 0 Stroke History: 0 Vascular Disease History: 1 Age Score: 2 Gender Score: 0             Physical Exam:    VS:  BP 124/72   Pulse 74   Ht 5\' 9"  (1.753 m)   Wt 226 lb 9.6 oz (102.8 kg)   SpO2 99%   BMI 33.46 kg/m     Wt Readings from Last 3 Encounters:  04/16/23 226 lb 9.6 oz (102.8 kg)  04/01/23 223 lb 6.4 oz (101.3 kg)  03/14/23 242 lb (109.8 kg)     GEN:  Well nourished, well developed in no  acute distress HEENT: Normal NECK: No JVD; No carotid bruits LYMPHATICS: No  lymphadenopathy CARDIAC: RRR, no murmurs, rubs, gallops RESPIRATORY:  Clear to auscultation without rales, wheezing or rhonchi  ABDOMEN: Soft, non-tender, non-distended MUSCULOSKELETAL:  No edema; No deformity  SKIN: Warm and dry NEUROLOGIC:  Alert and oriented x 3 PSYCHIATRIC:  Normal affect   ASSESSMENT:    1. Atrial fibrillation, unspecified type (HCC)   2. Chronic systolic heart failure (HCC)   3. Paroxysmal atrial fibrillation (HCC)   4. Chronic anticoagulation   5. Coronary artery disease involving native coronary artery of native heart without angina pectoris   6. S/P AVR   7. Hyperlipidemia with target LDL less than 70   8. Primary hypertension    PLAN:    In order of problems listed above:  Chronic systolic heart failure - LVEF historically 40-45% --> now 35-40% with RV dysfunction and MR -Medication changes to titrate GDMT included stopping Cardizem, starting Lopressor, Entresto, spironolactone - Consolidate Lopressor to Toprol -As needed Lasix -With restoration of sinus rhythm, plan repeat echocardiogram in 3 months -I will repeat a BMP today as his precardioversion creatinine was mildly elevated from baseline - If creatinine remains elevated, will discontinue spironolactone and start an SGLT2i   Atrial fibrillation-new diagnosis outside the setting of CABG History of postop A-fib after his CABG in 2018 -Recurrence of A-fib noted in the setting of shortness of breath and further reduced LVEF -Now anticoagulated with 5 mg Eliquis twice daily -DCCV on 04/10/2023 with restoration of sinus rhythm -He feels well - We reviewed that he was unaware of his A-fib and that it was rate controlled on initial diagnosis - He discusses different modalities for A-fib monitoring-I suggested either a cardia mobile or a Fitbit as his phone is an android -He has 2 PVCs on today's EKG, will collect magnesium and TSH as this was not done previously   CAD s/p CABG 2018 -Continue  aspirin   Aortic stenosis s/p AVR 2018 -Good valve function on echocardiogram 2023 -SBE prophylaxis   Hypertension -Managed in the context of CHF   Hyperlipidemia with LDL goal less than 70 - Continue Lipitor -Consider updated lipid panel at next visit   Morbid obesity - Working on weight loss             Medication Adjustments/Labs and Tests Ordered: Current medicines are reviewed at length with the patient today.  Concerns regarding medicines are outlined above.  Orders Placed This Encounter  Procedures   Basic metabolic panel   Magnesium   TSH   EKG 12-Lead   ECHOCARDIOGRAM COMPLETE   Meds ordered this encounter  Medications   DISCONTD: metoprolol tartrate (LOPRESSOR) 50 MG tablet    Sig: Take 1 tablet at night    Dispense:  90 tablet    Refill:  3   metoprolol succinate (TOPROL XL) 50 MG 24 hr tablet    Sig: Take with or immediately following a meal at night    Dispense:  90 tablet    Refill:  3    Patient Instructions  Medication Instructions:  Stop Metoprolol Tartrate 20 mg as directed Start Metoprolol Suuccinate 50 mg at night   *If you need a refill on your cardiac medications before your next appointment, please call your pharmacy*   Lab Work: BMET, Magnesium & TSH today  Testing/Procedures: Your physician has requested that you have an echocardiogram June 2025. Echocardiography is a painless test that uses sound waves to create images of your  heart. It provides your doctor with information about the size and shape of your heart and how well your heart's chambers and valves are working. This procedure takes approximately one hour. There are no restrictions for this procedure. Please do NOT wear cologne, perfume, aftershave, or lotions (deodorant is allowed). Please arrive 15 minutes prior to your appointment time.  Please note: We ask at that you not bring children with you during ultrasound (echo/ vascular) testing. Due to room size and  safety concerns, children are not allowed in the ultrasound rooms during exams. Our front office staff cannot provide observation of children in our lobby area while testing is being conducted. An adult accompanying a patient to their appointment will only be allowed in the ultrasound room at the discretion of the ultrasound technician under special circumstances. We apologize for any inconvenience.   Follow-Up: At [facility], you and your health needs are our priority.  As part of our continuing mission to provide you with exceptional heart care, we have created designated Provider Care Teams.  These Care Teams include your primary Cardiologist (physician) and Advanced Practice Providers (APPs -  Physician Assistants and Nurse Practitioners) who all work together to provide you with the care you need, when you need it.  We recommend signing up for the patient portal called "MyChart".  Sign up information is provided on this After Visit Summary.  MyChart is used to connect with patients for Virtual Visits (Telemedicine).  Patients are able to view lab/test results, encounter notes, upcoming appointments, etc.  Non-urgent messages can be sent to your provider as well.   To learn more about what you can do with MyChart, go to [email or web address].    Your next appointment:   June 2025 post Echo  Provider:   Bryan Lemma, MD     Other Instructions      1st Floor: - Lobby - Registration  - Pharmacy  - Lab - Cafe  2nd Floor: - PV Lab - Diagnostic Testing (echo, CT, nuclear med)  3rd Floor: - Vacant  4th Floor: - TCTS (cardiothoracic surgery) - AFib Clinic - Structural Heart Clinic - Vascular Surgery  - Vascular Ultrasound  5th Floor: - HeartCare Cardiology (general and EP) - Clinical Pharmacy for coumadin, hypertension, lipid, weight-loss medications, and med management appointments    Valet parking services will be available as well.      Signed, Marcelino Duster, [place]  04/16/2023 5:12 PM    [facility]

## 2023-04-10 ENCOUNTER — Ambulatory Visit (HOSPITAL_COMMUNITY): Admitting: Anesthesiology

## 2023-04-10 ENCOUNTER — Encounter (HOSPITAL_COMMUNITY)
Admission: RE | Disposition: A | Discharge status: Home / Self Care | Payer: Self-pay | Source: Home / Self Care | Attending: Cardiology

## 2023-04-10 ENCOUNTER — Ambulatory Visit (HOSPITAL_COMMUNITY)
Admission: RE | Admit: 2023-04-10 | Discharge: 2023-04-10 | Disposition: A | Discharge status: Home / Self Care | Attending: Cardiology | Admitting: Cardiology

## 2023-04-10 ENCOUNTER — Other Ambulatory Visit: Payer: Self-pay

## 2023-04-10 ENCOUNTER — Encounter (HOSPITAL_COMMUNITY): Payer: Self-pay | Admitting: Cardiology

## 2023-04-10 DIAGNOSIS — I4891 Unspecified atrial fibrillation: Secondary | ICD-10-CM | POA: Diagnosis not present

## 2023-04-10 DIAGNOSIS — Z8249 Family history of ischemic heart disease and other diseases of the circulatory system: Secondary | ICD-10-CM | POA: Diagnosis not present

## 2023-04-10 DIAGNOSIS — E785 Hyperlipidemia, unspecified: Secondary | ICD-10-CM | POA: Insufficient documentation

## 2023-04-10 DIAGNOSIS — I350 Nonrheumatic aortic (valve) stenosis: Secondary | ICD-10-CM | POA: Insufficient documentation

## 2023-04-10 DIAGNOSIS — I110 Hypertensive heart disease with heart failure: Secondary | ICD-10-CM | POA: Insufficient documentation

## 2023-04-10 DIAGNOSIS — I480 Paroxysmal atrial fibrillation: Secondary | ICD-10-CM

## 2023-04-10 DIAGNOSIS — Z952 Presence of prosthetic heart valve: Secondary | ICD-10-CM | POA: Insufficient documentation

## 2023-04-10 DIAGNOSIS — I2510 Atherosclerotic heart disease of native coronary artery without angina pectoris: Secondary | ICD-10-CM

## 2023-04-10 DIAGNOSIS — Z6832 Body mass index (BMI) 32.0-32.9, adult: Secondary | ICD-10-CM | POA: Diagnosis not present

## 2023-04-10 DIAGNOSIS — I10 Essential (primary) hypertension: Secondary | ICD-10-CM

## 2023-04-10 DIAGNOSIS — I4819 Other persistent atrial fibrillation: Secondary | ICD-10-CM | POA: Diagnosis not present

## 2023-04-10 DIAGNOSIS — I5022 Chronic systolic (congestive) heart failure: Secondary | ICD-10-CM | POA: Diagnosis not present

## 2023-04-10 DIAGNOSIS — Z87891 Personal history of nicotine dependence: Secondary | ICD-10-CM | POA: Insufficient documentation

## 2023-04-10 DIAGNOSIS — Z951 Presence of aortocoronary bypass graft: Secondary | ICD-10-CM | POA: Insufficient documentation

## 2023-04-10 DIAGNOSIS — Z7901 Long term (current) use of anticoagulants: Secondary | ICD-10-CM | POA: Diagnosis not present

## 2023-04-10 HISTORY — PX: CARDIOVERSION: EP1203

## 2023-04-10 SURGERY — CARDIOVERSION (CATH LAB)
Anesthesia: General

## 2023-04-10 MED ORDER — SODIUM CHLORIDE 0.9% FLUSH
3.0000 mL | INTRAVENOUS | Status: DC | PRN
  Administered 2023-04-10: 10 mL via INTRAVENOUS

## 2023-04-10 MED ORDER — LIDOCAINE 2% (20 MG/ML) 5 ML SYRINGE
INTRAMUSCULAR | Status: DC | PRN
  Administered 2023-04-10: 70 mg via INTRAVENOUS

## 2023-04-10 MED ORDER — SODIUM CHLORIDE 0.9% FLUSH: 3.0000 mL | Freq: Two times a day (BID) | INTRAVENOUS | Status: DC

## 2023-04-10 MED ORDER — PROPOFOL 10 MG/ML IV BOLUS
INTRAVENOUS | Status: DC | PRN
  Administered 2023-04-10: 50 mg via INTRAVENOUS

## 2023-04-10 SURGICAL SUPPLY — 1 items: PAD DEFIB RADIO PHYSIO CONN (PAD) ×1 IMPLANT

## 2023-04-10 NOTE — Transfer of Care (Signed)
 Immediate Anesthesia Transfer of Care Note  Patient: Joseph [name]  Procedure(s) Performed: CARDIOVERSION  Patient Location: PACU  Anesthesia Type:MAC  Level of Consciousness: awake, alert , and oriented  Airway & Oxygen Therapy: Patient Spontanous Breathing and Patient connected to nasal cannula oxygen  Post-op Assessment: Report given to RN and Post -op Vital signs reviewed and stable  Post vital signs: Reviewed and stable  Last Vitals:  Vitals Value Taken Time  BP    Temp    Pulse    Resp    SpO2      Last Pain:  Vitals:   04/10/23 0900  TempSrc:   PainSc: 0-No pain         Complications: No notable events documented.

## 2023-04-10 NOTE — Anesthesia Postprocedure Evaluation (Signed)
 Anesthesia Post Note  Patient: Joseph [name]  Procedure(s) Performed: CARDIOVERSION     Patient location during evaluation: Cath Lab Anesthesia Type: General Level of consciousness: awake and alert Pain management: pain level controlled Vital Signs Assessment: post-procedure vital signs reviewed and stable Respiratory status: spontaneous breathing, nonlabored ventilation, respiratory function stable and patient connected to nasal cannula oxygen Cardiovascular status: blood pressure returned to baseline and stable Postop Assessment: no apparent nausea or vomiting Anesthetic complications: no  No notable events documented.  Last Vitals:  Vitals:   04/10/23 1050 04/10/23 1055  BP: 118/74 118/65  Pulse: 63 69  Resp: 13 19  Temp:    SpO2: 98% 90%    Last Pain:  Vitals:   04/10/23 1040  TempSrc: Temporal  PainSc: 0-No pain                 Kailana Benninger L Vinicio Lynk

## 2023-04-10 NOTE — H&P (Signed)
 Office Visit 04/01/2023 [facility] at [facility], [place], [place] Cardiology Atrial fibrillation, unspecified type [facility]) +7 more Dx Follow-up  Reason for Visit   Additional Documentation  Vitals: BP 120/72 (BP Location: Right Arm, Patient Position: Sitting, Cuff Size: Normal)   Pulse 92   Ht 5\' 9"  (1.753 m)   Wt 101.3 kg   SpO2 97%   BMI 32.99 kg/m   BSA 2.22 m   Pain [place] 0-No pain  Flowsheets: NEWS,   MEWS Score,   Vital Signs,   Anthropometrics,   Data  Encounter Info: Billing Info,   History,   Allergies,   Detailed Report   All Notes   Progress Notes by Marcelino Duster, PA at 04/01/2023 1:55 PM  Author: Marcelino Duster, PA Author Type: Physician Assistant Filed: 04/01/2023  5:13 PM  Note Status: Signed Cosign: Cosign Not Required Encounter Date: 04/01/2023  Editor: Marcelino Duster, PA (Physician Assistant)             Expand All Collapse All  Cardiology Office Note:     Date:  04/01/2023    ID:  Joseph [name], DOB 1940/03/01, MRN [id number]   PCP:  Debroah Loop, DO              [facility] Providers Cardiologist:  Bryan Lemma, MD Cardiology APP:  Marcelino Duster, PA      Referring MD: No ref. provider found        Chief Complaint  Patient presents with   Follow-up      CHD, AFIB      History of Present Illness:     Joseph [name] is a 83 y.o. 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  Amiodarone subsequently stopped 11/2016 and Coumadin stopped 01/2017.  He has maintained normal sinus rhythm.  He understands SBE prophylaxis.  Echocardiogram following COVID-pneumonia in 12/2021 showed an LVEF 40-45%-similar to his last echo.  Good aortic valve function.   He called our office 03/11/2023 with shortness of breath and was started on Lasix 40 mg along with 20 mEq potassium supplementation.   He was added to my schedule. During my interview 03/14/23 he reports that he has been sleeping a lot, denies chest pain or edema.  proBNP was 1426.  EKG that day with atrial fibrillation that was rate controlled in the 90s.  Had high suspicion that new onset A-fib may be contributing to his heart failure symptoms.    I escalated his GDMT to include 40 mg Lasix daily, 24-26 mg Entresto twice daily, and 25 mg Lopressor twice daily.  I opted to stop Cardizem given his history of reduced EF.  I started him on anticoagulation with 5 mg Eliquis twice daily.  Of note, he has listed allergies to atenolol and losartan, both of which he did not recall.   Repeat echocardiogram showed an LVEF of 35-40%, mild LVH, moderately reduced RV function, moderate MR, AVR with good function.    He returns today for follow up. He feels significantly improved. He has been having what sounds like ocular migraine - 2-3 times in the past 2 days, he has had these in the past. He also started having dizziness 2-3 days ago - dizziness occurs without position changes and is brief, no syncope. He has also had intermittent blurry vision, which always resolves. I think he is a  bit dry on exam.    He remains in Afib today.          Past Medical History:  Diagnosis Date   Aortic stenosis     Arthritis     BPH (benign prostatic hyperplasia)     Depression     Diverticulosis     Dyspnea     Family history of adverse reaction to anesthesia      Pts mother had a difficult time awakening after anesthesia   GERD (gastroesophageal reflux disease)     Heart murmur     Hypertension     Normal pressure hydrocephalus (HCC)      03-03-14 had VP shunt inserted   Pre-diabetes     Rupture of left quadriceps tendon 07/21/2014   Skin cancer, basal cell     Urinary incontinence                 Past Surgical History:  Procedure Laterality Date   ANAL FISSURE REPAIR       AORTIC VALVE REPLACEMENT N/A 09/20/2016    Procedure: AORTIC  VALVE REPLACEMENT (AVR) USING MAGNA EASE 23 MM PERICARDIAL TISSUE HEART VALVE MODEL 3300TFX;  Surgeon: Alleen Borne, MD;  Location: [facility] OR;  Service: Open Heart Surgery;  Laterality: N/A;   BACK SURGERY       CARDIAC CATHETERIZATION       COLONOSCOPY W/ POLYPECTOMY       CORONARY ARTERY BYPASS GRAFT N/A 09/20/2016    Procedure: CORONARY ARTERY BYPASS GRAFTING (CABG)x 3 WITH ENDOSCOPIC HARVESTING OF RIGHT SAPHENOUS VEIN;  Surgeon: Alleen Borne, MD;  Location: [facility] OR;  Service: Open Heart Surgery;  Laterality: N/A;   ELBOW SURGERY Right     FINGER SURGERY       left shoulder RTC tear       LEG SURGERY        tendon repair right leg   MICRODISCECTOMY LUMBAR        l5-s1   QUADRICEPS TENDON REPAIR Left 07/21/2014    Procedure: REPAIR LEFT QUADRICEP TENDON;  Surgeon: Teryl Lucy, MD;  Location: [facility];  Service: Orthopedics;  Laterality: Left;   RIGHT/LEFT HEART CATH AND CORONARY ANGIOGRAPHY N/A 09/03/2016    Procedure: RIGHT/LEFT HEART CATH AND CORONARY ANGIOGRAPHY;  Surgeon: Corky Crafts, MD;  Location: [facility] INVASIVE CV LAB;  Service: Cardiovascular;  Laterality: N/A;   TEE WITHOUT CARDIOVERSION N/A 09/20/2016    Procedure: TRANSESOPHAGEAL ECHOCARDIOGRAM (TEE);  Surgeon: Alleen Borne, MD;  Location: [facility] OR;  Service: Open Heart Surgery;  Laterality: N/A;   TONSILLECTOMY       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    Procedure: SHUNT INSERTION VENTRICULAR-PERITONEAL;  Surgeon: Tia Alert, MD;  Location: [facility] NEURO ORS;  Service: Neurosurgery;  Laterality: Right;          Current Medications: Active Medications      Current Meds  Medication Sig   acetaminophen (TYLENOL) 500 MG tablet Take 500-1,000 mg by mouth 4 (four) times daily as needed for moderate pain.    ammonium lactate (AMLACTIN) 12 % cream Apply topically as needed.   amoxicillin (AMOXIL) 500 MG capsule Take 500 mg by mouth See admin instructions. Take 4 capsules 1 hour prior to dental  procedures   apixaban (ELIQUIS) 5 MG TABS tablet Take 1 tablet (5 mg total) by mouth 2 (two) times daily.   atorvastatin (LIPITOR) 10 MG tablet TAKE ONE TABLET BY MOUTH DAILY   clindamycin (CLEOCIN T)  1 % external solution Apply topically.   docusate sodium (COLACE) 100 MG capsule Take 100 mg by mouth daily.    econazole nitrate 1 % cream Apply topically.   GEMTESA 75 MG TABS Take 1 tablet by mouth every morning.   hydrALAZINE (APRESOLINE) 25 MG tablet TAKE ONE TABLET BY MOUTH THREE TIMES DAILY   ketoconazole (NIZORAL) 2 % shampoo Apply 1 application topically 2 (two) times a week.   lansoprazole (PREVACID) 30 MG capsule Take 30 mg by mouth 2 (two) times daily.    metoprolol tartrate (LOPRESSOR) 25 MG tablet Take 1 tablet (25 mg total) by mouth 2 (two) times daily.   metroNIDAZOLE (METROCREAM) 0.75 % cream Apply 1 application topically 2 (two) times daily.   nabumetone (RELAFEN) 750 MG tablet Take 1,500 mg by mouth daily.    psyllium (REGULOID) 0.52 g capsule Take 1.56 g by mouth daily.   sacubitril-valsartan (ENTRESTO) 24-26 MG Take 1 tablet by mouth 2 (two) times daily.   spironolactone (ALDACTONE) 25 MG tablet Take 0.5 tablets (12.5 mg total) by mouth daily.   tolterodine (DETROL LA) 2 MG 24 hr capsule Take 2 mg by mouth every morning.   [DISCONTINUED] furosemide (LASIX) 40 MG tablet Take 1 tablet (40 mg total) by mouth daily.   [DISCONTINUED] potassium chloride SA (KLOR-CON M20) 20 MEQ tablet Take 1 tablet (20 mEq total) by mouth daily.        Allergies:   Ace inhibitors, Atenolol, Beta adrenergic blockers, Erythromycin, and Ciprofloxacin    Social History         Socioeconomic History   Marital status: Married      Spouse name: Not on file   Number of children: Not on file   Years of education: Not on file   Highest education level: Not on file  Occupational History   Not on file  Tobacco Use   Smoking status: Former      Types: Cigarettes, Cigars, Pipe   Smokeless tobacco:  Never   Tobacco comments:      Quit in the 80's  Vaping Use   Vaping status: Never Used  Substance and Sexual Activity   Alcohol use: Yes      Alcohol/week: 2.0 - 3.0 standard drinks of alcohol      Types: 2 - 3 Glasses of wine per week      Comment: occasional 1-2 times week   Drug use: No   Sexual activity: Not Currently  Other Topics Concern   Not on file  Social History Narrative   Not on file    Social Drivers of Health    Financial Resource Strain: Not on file  Food Insecurity: Not on file  Transportation Needs: Not on file  Physical Activity: Not on file  Stress: Not on file  Social Connections: Not on file      Family History: The patient's family history includes Heart attack in his maternal grandmother; Heart disease in his mother. There is no history of Hypertension or Stroke.   ROS:   Please see the history of present illness.     All other systems reviewed and are negative.   EKGs/Labs/Other Studies Reviewed:     The following studies were reviewed today:   Echo 03/20/23:  1. Left ventricular ejection fraction, by estimation, is 35 to 40%. Left  ventricular ejection fraction by 3D volume is 34 %. The left ventricle has  moderately decreased function. The left ventricle demonstrates global  hypokinesis. There is mild left  ventricular hypertrophy. Left ventricular diastolic function could not be  evaluated. The average left ventricular global longitudinal strain is  -12.0 %. The global longitudinal strain is abnormal.   2. Right ventricular systolic function mild to moderately reduced. The  right ventricular size is normal. Tricuspid regurgitation signal is  inadequate for assessing PA pressure.   3. The mitral valve is degenerative. Moderate mitral valve regurgitation.  No evidence of mitral stenosis. Moderate mitral annular calcification.   4. S/P 23mm Magna Bioprosthetic aortic valve, implant date 09/20/2016,  not well seen, no valvular regurgitation,  no valvular stenosis (peak  velocity 2.65m/s, PG 22.34mmHG, MG 12.7 mmHG, DI 0.30, AT < ).   5. The inferior vena cava is normal in size with greater than 50%  respiratory variability, suggesting right atrial pressure of 3 mmHg.            Recent Labs: 03/11/2023: NT-Pro BNP 1,426 03/20/2023: BNP 128.5; BUN 19; Creatinine, Ser 1.24; Hemoglobin 10.3; Platelets 431; Potassium 4.3; Sodium 140  Recent Lipid Panel Labs (Brief)          Component Value Date/Time    CHOL 130 01/31/2017 0855    TRIG 110 01/31/2017 0855    HDL 62 01/31/2017 0855    CHOLHDL 2.1 01/31/2017 0855    LDLCALC 46 01/31/2017 0855          Risk Assessment/Calculations:     CHA2DS2-VASc Score = 5   This indicates a 7.2% annual risk of stroke. The patient's score is based upon: CHF History: 1 HTN History: 1 Diabetes History: 0 Stroke History: 0 Vascular Disease History: 1 Age Score: 2 Gender Score: 0              Physical Exam:     VS:  BP 120/72 (BP Location: Right Arm, Patient Position: Sitting, Cuff Size: Normal)   Pulse 92   Ht 5\' 9"  (1.753 m)   Wt 223 lb 6.4 oz (101.3 kg)   SpO2 97%   BMI 32.99 kg/m         Wt Readings from Last 3 Encounters:  04/01/23 223 lb 6.4 oz (101.3 kg)  03/14/23 242 lb (109.8 kg)  03/12/22 245 lb (111.1 kg)      GEN:  Well nourished, well developed in no acute distress HEENT: Normal NECK: No JVD; No carotid bruits LYMPHATICS: No lymphadenopathy CARDIAC: RRR, no murmurs, rubs, gallops RESPIRATORY:  Clear to auscultation without rales, wheezing or rhonchi  ABDOMEN: Soft, non-tender, non-distended MUSCULOSKELETAL:  No edema; No deformity  SKIN: Warm and dry NEUROLOGIC:  Alert and oriented x 3 PSYCHIATRIC:  Normal affect    ASSESSMENT:     1. Atrial fibrillation, unspecified type (HCC)   2. Chronic anticoagulation   3. S/P CABG (coronary artery bypass graft)   4. Coronary artery disease involving native coronary artery of native heart without angina  pectoris   5. Primary hypertension   6. Aortic valve stenosis, etiology of cardiac valve disease unspecified   7. S/P AVR   8. Hyperlipidemia with target LDL less than 70     PLAN:     In order of problems listed above:   Shortness of breath - improved Chronic systolic heart failure - LVEF historically 40-45% --> now 35-40% with RV dysfunction and MR - at last visit, I discontinue 240 mg Cardizem - Start 40 mg Lasix daily, 24-26 mg Entresto twice daily, 25 mg Lopressor twice daily -- much improved, will change lasix to PRN, stop potassium, start 12.5  mg spironolactone - recheck BMP and CBC in 1 week on 2/18, then DCCV on 03/13/23.  - I suspect dizziness and ocular migraines may signal overdiuresis - if persistent, will send to neurology, grossly neurologically intact     Atrial fibrillation History of postop A-fib after his CABG in 2018 -Is now converted back to A-fib -Rate controlled with metoprolol 25 mg twice daily -Now anticoagulated with 5 mg Eliquis twice daily - he is now euvolemic, will plan for outpatient DCCV     CAD s/p CABG 2018 -Continue aspirin  - no chest pain     Aortic stenosis s/p AVR 2018 -Good valve function on echocardiogram 2023 -SBE prophylaxis     Hypertension - BP well controlled, managed in the context of CHF     Hyperlipidemia with LDL goal less than 70 - Continue Lipitor     Morbid obesity - Working on weight loss     Given reduced LVEF along with moderately reduced RV, I suspect this may be related to recurrence of Afib. He is now rate controlled and euvolemic. Will further titrate GDMT with spironolactone, recheck labs, and then plan for DCCV without TEE on 04/10/23 - no missed doses of eliquis. Plan to recheck LVEF following DCCV and on max GDMT. Echo reviewed  with Dr. Herbie Baltimore and with Dr. Royann Shivers, low suspicion for PE.         Informed Consent Shared Decision Making/Informed Consent The risks (stroke, cardiac arrhythmias rarely  resulting in the need for a temporary or permanent pacemaker, skin irritation or burns and complications associated with conscious sedation including aspiration, arrhythmia, respiratory failure and death), benefits (restoration of normal sinus rhythm) and alternatives of a direct current cardioversion were explained in detail to Mr. Joseph [name] and he agrees to proceed.             Medication Adjustments/Labs and Tests Ordered: Current medicines are reviewed at length with the patient today.  Concerns regarding medicines are outlined above.     Orders Placed This Encounter  Procedures   Basic Metabolic Panel (BMET)   CBC   EKG 12-Lead        Meds ordered this encounter  Medications   spironolactone (ALDACTONE) 25 MG tablet      Sig: Take 0.5 tablets (12.5 mg total) by mouth daily.      Dispense:  45 tablet      Refill:  3      Patient Instructions  Medication Instructions:  STOP Lasix  STOP Potassium    START Spironolactone 12.5 mg daily   *If you need a refill on your cardiac medications before your next appointment, please call your pharmacy*     Lab Work: BMET in one week (March 18th) CBC in one week (March 18th)   If you have labs (blood work) drawn today and your tests are completely normal, you will receive your results only by: MyChart Message (if you have MyChart) OR A paper copy in the mail If you have any lab test that is abnormal or we need to change your treatment, we will call you to review the results.     Testing/Procedures:     Dear Joseph [name]  You are scheduled for a Cardioversion on Thursday, March 20 with Dr. Jens Som.  Please arrive at the [facility] (Main Entrance A) at [facility]: [address] [place], [place] [address] at 9:00 AM (This time is 1 hour(s) before your procedure to ensure your preparation).  Free valet parking service is available. You will check in at ADMITTING.    DIET:  Nothing to eat or drink after midnight  except a sip of water with medications (see medication instructions below)     LABS: CBC/ BMET on Tuesday April 08, 2023   Continue taking your anticoagulant (blood thinner): Apixaban (Eliquis).  You will need to continue this after your procedure until you are told by your provider that it is safe to stop.     FYI:  For your safety, and to allow [place] to monitor your vital signs accurately during the surgery/procedure we request: If you have artificial nails, gel coating, SNS etc, please have those removed prior to your surgery/procedure. Not having the nail coverings /polish removed may result in cancellation or delay of your surgery/procedure.   Your support person will be asked to wait in the waiting room during your procedure.  It is OK to have someone drop you off and come back when you are ready to be discharged.  You cannot drive after the procedure and will need someone to drive you home.   Bring your insurance cards.   *Special Note: Every effort is made to have your procedure done on time. Occasionally there are emergencies that occur at the hospital that may cause delays. Please be patient if a delay does occur.      Follow-Up: At [facility], you and your health needs are our priority.  As part of our continuing mission to provide you with exceptional heart care, we have created designated Provider Care Teams.  These Care Teams include your primary Cardiologist (physician) and Advanced Practice Providers (APPs -  Physician Assistants and Nurse Practitioners) who all work together to provide you with the care you need, when you need it.   We recommend signing up for the patient portal called "MyChart".  Sign up information is provided on this After Visit Summary.  MyChart is used to connect with patients for Virtual Visits (Telemedicine).  Patients are able to view lab/test results, encounter notes, upcoming appointments, etc.  Non-urgent messages can be sent to your provider  as well.   To learn more about what you can do with MyChart, go to [email or web address].     Your next appointment:   April 16, 2023 at 2:45 pm   Provider:   Micah Flesher PA      For DCCV; compliant with apixaban; no changes. Olga Millers

## 2023-04-10 NOTE — Procedures (Signed)
 Electrical Cardioversion Procedure Note [facility] [id number] 28-Dec-1940  Procedure: Electrical Cardioversion Indications:  Atrial Fibrillation  Procedure Details Consent: Risks of procedure as well as the alternatives and risks of each were explained to the (patient/caregiver).  Consent for procedure obtained. Time Out: Verified patient identification, verified procedure, site/side was marked, verified correct patient position, special equipment/implants available, medications/allergies/relevent history reviewed, required imaging and test results available.  Performed  Patient placed on cardiac monitor, pulse oximetry, supplemental oxygen as necessary.  Sedation given:  Pt sedated by anesthesia with lidocaine 100 mg and diprovan 70 mg IV. Pacer pads placed anterior and posterior chest.  Cardioverted 1 time(s).  Cardioverted at 300J.  Evaluation Findings: Post procedure EKG shows: NSR Complications: None Patient did tolerate procedure well.   Olga Millers 04/10/2023, 9:55 AM

## 2023-04-10 NOTE — Anesthesia Preprocedure Evaluation (Addendum)
 Anesthesia Evaluation  Patient identified by MRN, date of birth, ID band Patient awake    Reviewed: Allergy & Precautions, NPO status , Patient's Chart, lab work & pertinent test results, reviewed documented beta blocker date and time   Airway Mallampati: III  TM Distance: >3 FB Neck ROM: Full    Dental  (+) Dental Advisory Given, Chipped   Pulmonary former smoker   Pulmonary exam normal breath sounds clear to auscultation       Cardiovascular hypertension, Pt. on home beta blockers and Pt. on medications + CAD and + CABG  Normal cardiovascular exam+ dysrhythmias Atrial Fibrillation + Valvular Problems/Murmurs (s/p AVR)  Rhythm:Irregular Rate:Normal  TTE 2025  1. Left ventricular ejection fraction, by estimation, is 35 to 40%. Left  ventricular ejection fraction by 3D volume is 34 %. The left ventricle has  moderately decreased function. The left ventricle demonstrates global  hypokinesis. There is mild left  ventricular hypertrophy. Left ventricular diastolic function could not be  evaluated. The average left ventricular global longitudinal strain is  -12.0 %. The global longitudinal strain is abnormal.   2. Right ventricular systolic function mild to moderately reduced. The  right ventricular size is normal. Tricuspid regurgitation signal is  inadequate for assessing PA pressure.   3. The mitral valve is degenerative. Moderate mitral valve regurgitation.  No evidence of mitral stenosis. Moderate mitral annular calcification.   4. S/P 23mm Magna Bioprosthetic aortic valve, implant date 09/20/2016,  not well seen, no valvular regurgitation, no valvular stenosis (peak  velocity 2.37m/s, PG 22.45mmHG, MG 12.7 mmHG, DI 0.30, AT < ).   5. The inferior vena cava is normal in size with greater than 50%  respiratory variability, suggesting right atrial pressure of 3 mmHg.     Neuro/Psych  PSYCHIATRIC DISORDERS  Depression     negative neurological ROS     GI/Hepatic Neg liver ROS,GERD  ,,  Endo/Other    Class 3 obesity  Renal/GU negative Renal ROS  negative genitourinary   Musculoskeletal  (+) Arthritis ,    Abdominal   Peds  Hematology  (+) Blood dyscrasia (eliquis)   Anesthesia Other Findings   Reproductive/Obstetrics                             Anesthesia Physical Anesthesia Plan  ASA: 3  Anesthesia Plan: General   Post-op Pain Management:    Induction: Intravenous  PONV Risk Score and Plan: Propofol infusion and Treatment may vary due to age or medical condition  Airway Management Planned: Natural Airway  Additional Equipment:   Intra-op Plan:   Post-operative Plan:   Informed Consent: I have reviewed the patients History and Physical, chart, labs and discussed the procedure including the risks, benefits and alternatives for the proposed anesthesia with the patient or authorized representative who has indicated his/her understanding and acceptance.     Dental advisory given  Plan Discussed with: CRNA  Anesthesia Plan Comments:        Anesthesia Quick Evaluation

## 2023-04-10 NOTE — Interval H&P Note (Signed)
 History and Physical Interval Note:  04/10/2023 9:56 AM  Joseph [name]  has presented today for surgery, with the diagnosis of AFIB.  The various methods of treatment have been discussed with the patient and family. After consideration of risks, benefits and other options for treatment, the patient has consented to  Procedure(s): CARDIOVERSION (N/A) as a surgical intervention.  The patient's history has been reviewed, patient examined, no change in status, stable for surgery.  I have reviewed the patient's chart and labs.  Questions were answered to the patient's satisfaction.     Olga Millers

## 2023-04-11 ENCOUNTER — Encounter (HOSPITAL_COMMUNITY): Payer: Self-pay | Admitting: Cardiology

## 2023-04-16 ENCOUNTER — Other Ambulatory Visit: Payer: Self-pay | Admitting: Physician Assistant

## 2023-04-16 ENCOUNTER — Encounter: Payer: Self-pay | Admitting: Physician Assistant

## 2023-04-16 ENCOUNTER — Ambulatory Visit: Attending: Physician Assistant | Admitting: Physician Assistant

## 2023-04-16 VITALS — BP 124/72 | HR 74 | Ht 69.0 in | Wt 226.6 lb

## 2023-04-16 DIAGNOSIS — E785 Hyperlipidemia, unspecified: Secondary | ICD-10-CM

## 2023-04-16 DIAGNOSIS — I10 Essential (primary) hypertension: Secondary | ICD-10-CM

## 2023-04-16 DIAGNOSIS — I2510 Atherosclerotic heart disease of native coronary artery without angina pectoris: Secondary | ICD-10-CM | POA: Diagnosis not present

## 2023-04-16 DIAGNOSIS — Z7901 Long term (current) use of anticoagulants: Secondary | ICD-10-CM

## 2023-04-16 DIAGNOSIS — I5022 Chronic systolic (congestive) heart failure: Secondary | ICD-10-CM

## 2023-04-16 DIAGNOSIS — I480 Paroxysmal atrial fibrillation: Secondary | ICD-10-CM

## 2023-04-16 DIAGNOSIS — Z952 Presence of prosthetic heart valve: Secondary | ICD-10-CM

## 2023-04-16 DIAGNOSIS — I4891 Unspecified atrial fibrillation: Secondary | ICD-10-CM

## 2023-04-16 MED ORDER — METOPROLOL SUCCINATE ER 50 MG PO TB24: ORAL_TABLET | ORAL | 3 refills | Status: DC

## 2023-04-16 MED ORDER — METOPROLOL TARTRATE 50 MG PO TABS: ORAL_TABLET | ORAL | 3 refills | Status: DC

## 2023-04-16 NOTE — Patient Instructions (Addendum)
 Medication Instructions:  Stop Metoprolol Tartrate 20 mg as directed Start Metoprolol Suuccinate 50 mg at night   *If you need a refill on your cardiac medications before your next appointment, please call your pharmacy*   Lab Work: BMET, Magnesium & TSH today  Testing/Procedures: Your physician has requested that you have an echocardiogram June 2025. Echocardiography is a painless test that uses sound waves to create images of your heart. It provides your doctor with information about the size and shape of your heart and how well your heart's chambers and valves are working. This procedure takes approximately one hour. There are no restrictions for this procedure. Please do NOT wear cologne, perfume, aftershave, or lotions (deodorant is allowed). Please arrive 15 minutes prior to your appointment time.  Please note: We ask at that you not bring children with you during ultrasound (echo/ vascular) testing. Due to room size and safety concerns, children are not allowed in the ultrasound rooms during exams. Our front office staff cannot provide observation of children in our lobby area while testing is being conducted. An adult accompanying a patient to their appointment will only be allowed in the ultrasound room at the discretion of the ultrasound technician under special circumstances. We apologize for any inconvenience.   Follow-Up: At [facility], you and your health needs are our priority.  As part of our continuing mission to provide you with exceptional heart care, we have created designated Provider Care Teams.  These Care Teams include your primary Cardiologist (physician) and Advanced Practice Providers (APPs -  Physician Assistants and Nurse Practitioners) who all work together to provide you with the care you need, when you need it.  We recommend signing up for the patient portal called "MyChart".  Sign up information is provided on this After Visit Summary.  MyChart is used  to connect with patients for Virtual Visits (Telemedicine).  Patients are able to view lab/test results, encounter notes, upcoming appointments, etc.  Non-urgent messages can be sent to your provider as well.   To learn more about what you can do with MyChart, go to [email or web address].    Your next appointment:   June 2025 post Echo  Provider:   Bryan Lemma, MD     Other Instructions      1st Floor: - Lobby - Registration  - Pharmacy  - Lab - Cafe  2nd Floor: - PV Lab - Diagnostic Testing (echo, CT, nuclear med)  3rd Floor: - Vacant  4th Floor: - TCTS (cardiothoracic surgery) - AFib Clinic - Structural Heart Clinic - Vascular Surgery  - Vascular Ultrasound  5th Floor: - [facility] (general and EP) - Clinical Pharmacy for coumadin, hypertension, lipid, weight-loss medications, and med management appointments    Valet parking services will be available as well.

## 2023-04-17 LAB — BASIC METABOLIC PANEL WITH GFR
BUN/Creatinine Ratio: 12 (ref 10–24)
BUN: 13 mg/dL (ref 8–27)
CO2: 24 mmol/L (ref 20–29)
Calcium: 9.2 mg/dL (ref 8.6–10.2)
Chloride: 106 mmol/L (ref 96–106)
Creatinine, Ser: 1.11 mg/dL (ref 0.76–1.27)
Glucose: 91 mg/dL (ref 70–99)
Potassium: 4 mmol/L (ref 3.5–5.2)
Sodium: 144 mmol/L (ref 134–144)
eGFR: 67 mL/min/{1.73_m2} (ref >59)

## 2023-04-17 LAB — TSH: TSH: 1.21 u[IU]/mL (ref 0.450–4.500)

## 2023-04-17 LAB — MAGNESIUM: Magnesium: 1.8 mg/dL (ref 1.6–2.3)

## 2023-04-18 ENCOUNTER — Telehealth: Payer: Self-pay | Admitting: Physician Assistant

## 2023-04-18 NOTE — Telephone Encounter (Signed)
 Pt c/o medication issue:  1. Name of Medication: magnesium  2. How are you currently taking this medication (dosage and times per day)? Has not started  3. Are you having a reaction (difficulty breathing--STAT)? No  4. What is your medication issue? Pt was told to take 200mg  which he cannot find, is requesting cb

## 2023-04-18 NOTE — Telephone Encounter (Signed)
 Called and spoke to pt, Duke, PA's advice given:   250 mg tablets are just fine if that's what you can find.   Take one tablet twice daily and see how you do.   Thanks!  Angie

## 2023-04-21 ENCOUNTER — Encounter (HOSPITAL_BASED_OUTPATIENT_CLINIC_OR_DEPARTMENT_OTHER): Payer: Self-pay

## 2023-04-21 ENCOUNTER — Emergency Department (HOSPITAL_BASED_OUTPATIENT_CLINIC_OR_DEPARTMENT_OTHER): Admitting: Radiology

## 2023-04-21 ENCOUNTER — Emergency Department (HOSPITAL_BASED_OUTPATIENT_CLINIC_OR_DEPARTMENT_OTHER)

## 2023-04-21 ENCOUNTER — Other Ambulatory Visit: Payer: Self-pay

## 2023-04-21 ENCOUNTER — Emergency Department (HOSPITAL_BASED_OUTPATIENT_CLINIC_OR_DEPARTMENT_OTHER)
Admission: EM | Admit: 2023-04-21 | Discharge: 2023-04-21 | Disposition: A | Discharge status: Home / Self Care | Attending: Emergency Medicine | Admitting: Emergency Medicine

## 2023-04-21 DIAGNOSIS — I517 Cardiomegaly: Secondary | ICD-10-CM | POA: Diagnosis not present

## 2023-04-21 DIAGNOSIS — Z982 Presence of cerebrospinal fluid drainage device: Secondary | ICD-10-CM | POA: Diagnosis not present

## 2023-04-21 DIAGNOSIS — Y9301 Activity, walking, marching and hiking: Secondary | ICD-10-CM | POA: Insufficient documentation

## 2023-04-21 DIAGNOSIS — S0990XA Unspecified injury of head, initial encounter: Secondary | ICD-10-CM | POA: Diagnosis not present

## 2023-04-21 DIAGNOSIS — W1839XA Other fall on same level, initial encounter: Secondary | ICD-10-CM | POA: Diagnosis not present

## 2023-04-21 DIAGNOSIS — S80212A Abrasion, left knee, initial encounter: Secondary | ICD-10-CM | POA: Diagnosis not present

## 2023-04-21 DIAGNOSIS — M25562 Pain in left knee: Secondary | ICD-10-CM | POA: Diagnosis not present

## 2023-04-21 DIAGNOSIS — S8992XA Unspecified injury of left lower leg, initial encounter: Secondary | ICD-10-CM | POA: Diagnosis not present

## 2023-04-21 DIAGNOSIS — S50311A Abrasion of right elbow, initial encounter: Secondary | ICD-10-CM | POA: Diagnosis not present

## 2023-04-21 DIAGNOSIS — Z7901 Long term (current) use of anticoagulants: Secondary | ICD-10-CM | POA: Diagnosis not present

## 2023-04-21 DIAGNOSIS — I6782 Cerebral ischemia: Secondary | ICD-10-CM | POA: Diagnosis not present

## 2023-04-21 DIAGNOSIS — S50312A Abrasion of left elbow, initial encounter: Secondary | ICD-10-CM | POA: Insufficient documentation

## 2023-04-21 DIAGNOSIS — R0789 Other chest pain: Secondary | ICD-10-CM | POA: Insufficient documentation

## 2023-04-21 DIAGNOSIS — W19XXXA Unspecified fall, initial encounter: Secondary | ICD-10-CM

## 2023-04-21 NOTE — Discharge Instructions (Signed)
 Your CT scan looks good.  No signs of bleeding inside the skull.  X-ray of your chest does not show any obvious broken ribs.  Please return for worsening headache confusion or vomiting.  Whenever you fall down that can be a sign that there is something else going on.  Typically the family doctor wants to know if you have fallen and then they can decide if they want to see back in the office to perhaps evaluate your medications or perhaps consider physical therapy.

## 2023-04-21 NOTE — ED Triage Notes (Signed)
 Pt POV from home c/o fall from walking. States he had gotten out of his car and was starting to walk when he stumbled and fell on to his left knee. Did not hit head. No LOC. Is on anticoags for a fib. CAOx4.

## 2023-04-21 NOTE — ED Provider Notes (Signed)
 [place] EMERGENCY DEPARTMENT AT [facility] Provider Note   CSN: [id number] Arrival date & time: 04/21/23  1536     History  Chief Complaint  Patient presents with   Joseph [name] is a 83 y.o. male.  83 yo M with a cc of a fall.  Patient said he was clumsy and he ended up falling down.  Landed mostly on his left side.  Complaining of left-sided chest pain.  He actually did not notice this until he got here.  He did suffer mostly scrapes to his left knee and both of his elbows.  Tells me they do not hurt at all.  Tells me that his tetanus is up-to-date.  He does not think that he struck his head.  Denies headache denies neck pain.   Fall       Home Medications Prior to Admission medications   Medication Sig Start Date End Date Taking? Authorizing Provider  acetaminophen (TYLENOL) 500 MG tablet Take 500-1,000 mg by mouth 4 (four) times daily as needed for moderate pain.     [provider]  ammonium lactate (AMLACTIN) 12 % cream Apply 1 Application topically as needed for dry skin. 09/09/18   [provider]  amoxicillin (AMOXIL) 500 MG capsule Take 500 mg by mouth See admin instructions. Take 4 capsules 1 hour prior to dental procedures    [provider]  apixaban (ELIQUIS) 5 MG TABS tablet Take 1 tablet (5 mg total) by mouth 2 (two) times daily. 03/14/23   Marcelino Duster, PA  atorvastatin (LIPITOR) 10 MG tablet TAKE ONE TABLET BY MOUTH DAILY 02/07/23   Dyann Kief, PA-C  clindamycin (CLEOCIN T) 1 % external solution Apply 1 Application topically 2 (two) times daily as needed (Rosacea).    [provider]  GEMTESA 75 MG TABS Take 1 tablet by mouth every morning. 01/02/21   [provider]  hydrALAZINE (APRESOLINE) 25 MG tablet TAKE ONE TABLET BY MOUTH THREE TIMES DAILY 02/07/23   Dyann Kief, PA-C  ketoconazole (NIZORAL) 2 % shampoo Apply 1 application  topically once a week. 12/11/18   [provider]  lansoprazole (PREVACID) 30 MG capsule Take 30 mg by mouth 2 (two) times daily.     [provider]  metoprolol succinate (TOPROL-XL) 50 MG 24 hr tablet Take with or immediately following a meal at night 04/17/23   Marykay Lex, MD  metroNIDAZOLE (METROCREAM) 0.75 % cream Apply 1 application  topically 2 (two) times daily as needed (rosacea). 12/08/19   [provider]  nabumetone (RELAFEN) 750 MG tablet Take 1,500 mg by mouth daily.  06/09/15   [provider]  psyllium (REGULOID) 0.52 g capsule Take 1.56 g by mouth daily.    [provider]  sacubitril-valsartan (ENTRESTO) 24-26 MG Take 1 tablet by mouth 2 (two) times daily. 03/14/23   Marcelino Duster, PA  spironolactone (ALDACTONE) 25 MG tablet Take 0.5 tablets (12.5 mg total) by mouth daily. 04/01/23   Duke, Roe Rutherford, PA  tolterodine (DETROL LA) 2 MG 24 hr capsule Take 2 mg by mouth every morning. 12/21/18   [provider]      Allergies    Ace inhibitors, Atenolol, Beta adrenergic blockers, Erythromycin, and Ciprofloxacin    Review of Systems   Review of Systems  Physical Exam Updated Vital Signs BP (!) 142/82   Pulse 68   Temp 97.6 F (36.4 C) (Oral)   Resp  18   Ht 5\' 9"  (1.753 m)   Wt 102.1 kg   SpO2 100%   BMI 33.23 kg/m  Physical Exam Vitals and nursing note reviewed.  Constitutional:      Appearance: He is well-developed.  HENT:     Head: Normocephalic and atraumatic.  Eyes:     Pupils: Pupils are equal, round, and reactive to light.  Neck:     Vascular: No JVD.  Cardiovascular:     Rate and Rhythm: Normal rate and regular rhythm.     Heart sounds: No murmur heard.    No friction rub. No gallop.  Pulmonary:     Effort: No respiratory distress.     Breath sounds: No wheezing.  Abdominal:     General: There is no distension.     Tenderness: There is no abdominal tenderness. There is no guarding or rebound.  Musculoskeletal:        General:  Normal range of motion.     Cervical back: Normal range of motion and neck supple.     Comments: Patient has an abrasion overlying the left knee and bilateral elbows.  There is a mild amount of edema to the left knee.  We able to range it without significant discomfort.  He does appear to have a possible deformity to the right elbow.  Does have some pain with range of motion.  Has some pain about the left anterior rib angles about ribs 4 through 6.  No obvious external signs of trauma to the chest or abdomen.  No midline spinal tenderness step-offs or deformities.  Able to rotate his head without discomfort.  Skin:    Coloration: Skin is not pale.     Findings: No rash.  Neurological:     Mental Status: He is alert and oriented to person, place, and time.  Psychiatric:        Behavior: Behavior normal.     ED Results / Procedures / Treatments   Labs (all labs ordered are listed, but only abnormal results are displayed) Labs Reviewed - No data to display  EKG None  Radiology DG Ribs Unilateral W/Chest Left Result Date: 04/21/2023 CLINICAL DATA:  Left chest wall pain following a fall. EXAM: LEFT RIBS AND CHEST - 3+ VIEW COMPARISON:  12/31/2020. FINDINGS: Stable mildly enlarged cardiac silhouette and mildly prominent pulmonary vasculature. Clear lungs. 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 The distal tip of the shunt catheter is not clearly visualized. No rib fracture or pneumothorax. Bilateral glenohumeral degenerative changes and left shoulder anchor. IMPRESSION: 1. No fracture. 2. Stable mild cardiomegaly and mild pulmonary vascular congestion. Electronically Signed   By: Beckie Salts M.D.   On: 04/21/2023 16:50   CT Head Wo Contrast Result Date: 04/21/2023 CLINICAL DATA:  Head trauma. EXAM: CT HEAD WITHOUT CONTRAST TECHNIQUE: Contiguous axial images were obtained from the base of the skull through the vertex without intravenous contrast. RADIATION  DOSE REDUCTION: This exam was performed according to the departmental dose-optimization program which includes automated exposure control, adjustment of the mA and/or kV according to patient size and/or use of iterative reconstruction technique. COMPARISON:  Head CT dated 10/12/2021. FINDINGS: Brain: Right parietal ventriculostomy shunt with tip in the frontal horn of the left lateral ventricle in similar position. Overall interval decrease in the size of the lateral ventricle compared to prior CT. The left lateral ventricle measures approximately 9 mm in diameter (previously 18 mm). There is age-related atrophy and chronic microvascular ischemic  changes. There is no acute intracranial hemorrhage. No mass effect or midline shift. Diffuse dilatation of the subdural space with low attenuating fluid, new since the prior CT may be related to volume loss and subdural hygroma. Vascular: No hyperdense vessel or unexpected calcification. Skull: Normal. Negative for fracture or focal lesion. Sinuses/Orbits: Diffuse mucoperiosteal thickening of paranasal sinuses. No air-fluid level the mastoid air cells are clear. Other: None IMPRESSION: 1. No acute intracranial pathology. 2. Right parietal ventriculostomy shunt in similar position with interval decrease in the size of the lateral ventricle compared to prior CT. 3. Age-related atrophy and chronic microvascular ischemic changes. 4. 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 Electronically Signed   By: Elgie Collard M.D.   On: 04/21/2023 16:49    Procedures Procedures    Medications Ordered in ED Medications - No data to display  ED Course/ Medical Decision Making/ A&P                                 Medical Decision Making Amount and/or Complexity of Data Reviewed Radiology: ordered.   83 yo M with a chief complaints of a fall.  Nonsyncopal by history complaining mostly of abrasions to his left knee and bilateral elbows and pain to his left chest wall.  I  discussed performing imaging of his elbow and knees.  He is currently declining and says that they do not hurt.  I offered to obtain a plain film of the chest.  He is really worried about his head even though he said he did not hit his head and it does not hurt.  Will obtain CT imaging.  CT of the head negative for acute intracranial pathology.  Plain film of the ribs on my independent interpretation without obvious fracture or pneumothorax.  Will discharge home.  PCP follow-up.  7:33 PM:  I have discussed the diagnosis/risks/treatment options with the patient and family.  Evaluation and diagnostic testing in the emergency department does not suggest an emergent condition requiring admission or immediate intervention beyond what has been performed at this time.  They will follow up with PCP. We also discussed returning to the ED immediately if new or worsening sx occur. We discussed the sx which are most concerning (e.g., sudden worsening pain, fever, inability to tolerate by mouth) that necessitate immediate return. Medications administered to the patient during their visit and any new prescriptions provided to the patient are listed below.  Medications given during this visit Medications - No data to display   The patient appears reasonably screen and/or stabilized for discharge and I doubt any other medical condition or other [facility] requiring further screening, evaluation, or treatment in the ED at this time prior to discharge.          Final Clinical Impression(s) / ED Diagnoses Final diagnoses:  Fall, initial encounter    Rx / DC Orders ED Discharge Orders     None         Melene Plan, DO 04/21/23 1933

## 2023-04-21 NOTE — ED Notes (Signed)
 Patient transported to X-ray

## 2023-04-25 ENCOUNTER — Other Ambulatory Visit (HOSPITAL_BASED_OUTPATIENT_CLINIC_OR_DEPARTMENT_OTHER): Payer: Self-pay

## 2023-04-25 MED ORDER — CAPVAXIVE 0.5 ML IM SOSY
0.5000 mL | PREFILLED_SYRINGE | Freq: Once | INTRAMUSCULAR | 0 refills | Status: AC
  Filled 2023-04-25: qty 0.5, 1d supply, fill #0

## 2023-05-14 DIAGNOSIS — M25571 Pain in right ankle and joints of right foot: Secondary | ICD-10-CM | POA: Diagnosis not present

## 2023-05-28 ENCOUNTER — Ambulatory Visit: Admitting: Family Medicine

## 2023-05-28 ENCOUNTER — Encounter: Payer: Self-pay | Admitting: Family Medicine

## 2023-05-28 VITALS — BP 134/60 | Ht 69.0 in | Wt 226.0 lb

## 2023-05-28 DIAGNOSIS — M2142 Flat foot [pes planus] (acquired), left foot: Secondary | ICD-10-CM

## 2023-05-28 DIAGNOSIS — M2141 Flat foot [pes planus] (acquired), right foot: Secondary | ICD-10-CM | POA: Diagnosis not present

## 2023-05-28 DIAGNOSIS — M79671 Pain in right foot: Secondary | ICD-10-CM

## 2023-05-28 NOTE — Progress Notes (Signed)
 PCP: Elida Grounds, DO  Subjective:   HPI: Patient is a 83 y.o. male here for custom orthotics.  Patient was referred here by Dr. Agatha Horsfall for custom orthotics. He is being treated there for lateral foot pain - has been using voltaren gel with relief. Walks with a walker for stability. Pain has improved so far. No acute injury or trauma. He wears superfeet insoles but also has a steel insert in one of his shoes from a prior 5th metatarsal fracture - has just continued to use though no pain here.  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HCC)    03-03-14 had VP shunt inserted   Pre-diabetes    Rupture of left quadriceps tendon 07/21/2014   Skin cancer, basal cell    Urinary incontinence     Current Outpatient Medications on File Prior to Visit  Medication Sig Dispense Refill   acetaminophen  (TYLENOL ) 500 MG tablet Take 500-1,000 mg by mouth 4 (four) times daily as needed for moderate pain.      ammonium lactate (AMLACTIN) 12 % cream Apply 1 Application topically as needed for dry skin.     amoxicillin  (AMOXIL ) 500 MG capsule Take 500 mg by mouth See admin instructions. Take 4 capsules 1 hour prior to dental procedures     apixaban  (ELIQUIS ) 5 MG TABS tablet Take 1 tablet (5 mg total) by mouth 2 (two) times daily. 180 tablet 3   atorvastatin  (LIPITOR) 10 MG tablet TAKE ONE TABLET BY MOUTH DAILY 90 tablet 3   clindamycin (CLEOCIN T) 1 % external solution Apply 1 Application topically 2 (two) times daily as needed (Rosacea).     GEMTESA 75 MG TABS Take 1 tablet by mouth every morning.     hydrALAZINE  (APRESOLINE ) 25 MG tablet TAKE ONE TABLET BY MOUTH THREE  TIMES DAILY 270 tablet 3   ketoconazole (NIZORAL) 2 % shampoo Apply 1 application  topically once a week.     lansoprazole (PREVACID) 30 MG capsule Take 30 mg by mouth 2 (two) times daily.      metoprolol  succinate (TOPROL -XL) 50 MG 24 hr tablet Take with or immediately following a meal at night 90 tablet 3   metroNIDAZOLE (METROCREAM) 0.75 % cream Apply 1 application  topically 2 (two) times daily as needed (rosacea).     nabumetone (RELAFEN) 750 MG tablet Take 1,500 mg by mouth daily.      psyllium (REGULOID) 0.52 g capsule Take 1.56 g by mouth daily.     sacubitril -valsartan  (ENTRESTO ) 24-26 MG Take 1 tablet by mouth 2 (two) times daily. 180 tablet 3   spironolactone  (ALDACTONE ) 25 MG tablet Take 0.5 tablets (12.5 mg total) by mouth daily. 45 tablet 3   tolterodine (DETROL LA) 2 MG 24 hr capsule Take 2 mg by mouth every morning.     No current facility-administered medications on file prior to visit.    Past Surgical History:  Procedure Laterality Date   ANAL FISSURE REPAIR     AORTIC VALVE REPLACEMENT N/A 09/20/2016   Procedure: AORTIC VALVE REPLACEMENT (AVR) USING MAGNA EASE 23 MM PERICARDIAL TISSUE HEART  VALVE MODEL 3300TFX;  Surgeon: Bartley Lightning, MD;  Location: [facility] OR;  Service: Open Heart Surgery;  Laterality: N/A;   BACK SURGERY     CARDIAC CATHETERIZATION     CARDIOVERSION N/A 04/10/2023   Procedure: CARDIOVERSION;  Surgeon: Lenise Quince, MD;  Location: [facility] INVASIVE CV LAB;  Service: Cardiovascular;  Laterality: N/A;   COLONOSCOPY W/ POLYPECTOMY     CORONARY ARTERY BYPASS GRAFT N/A 09/20/2016   Procedure: CORONARY ARTERY BYPASS GRAFTING (CABG)x 3 WITH ENDOSCOPIC HARVESTING OF RIGHT SAPHENOUS VEIN;  Surgeon: Bartley Lightning, MD;  Location: [facility] OR;  Service: Open Heart Surgery;  Laterality: N/A;   ELBOW SURGERY Right    FINGER SURGERY     left shoulder RTC tear     LEG SURGERY     tendon repair right leg   MICRODISCECTOMY LUMBAR     l5-s1   QUADRICEPS TENDON REPAIR Left  07/21/2014   Procedure: REPAIR LEFT QUADRICEP TENDON;  Surgeon: Osa Blase, MD;  Location: [facility];  Service: Orthopedics;  Laterality: Left;   RIGHT/LEFT HEART CATH AND CORONARY ANGIOGRAPHY N/A 09/03/2016   Procedure: RIGHT/LEFT HEART CATH AND CORONARY ANGIOGRAPHY;  Surgeon: Lucendia Rusk, MD;  Location: [facility] INVASIVE CV LAB;  Service: Cardiovascular;  Laterality: N/A;   TEE WITHOUT CARDIOVERSION N/A 09/20/2016   Procedure: TRANSESOPHAGEAL ECHOCARDIOGRAM (TEE);  Surgeon: Bartley Lightning, MD;  Location: [facility] OR;  Service: Open Heart Surgery;  Laterality: N/A;   TONSILLECTOMY     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   Procedure: SHUNT INSERTION VENTRICULAR-PERITONEAL;  Surgeon: Isadora Mar, MD;  Location: [facility] NEURO ORS;  Service: Neurosurgery;  Laterality: Right;    Allergies  Allergen Reactions   Ace Inhibitors Cough   Atenolol Shortness Of Breath   Beta Adrenergic Blockers Shortness Of Breath    Moderate reaction   Erythromycin Diarrhea   Ciprofloxacin Other (See Comments)    ANYTHING RELATED TO CIPRO, TENDON RUPTURES     BP 134/60   Ht 5\' 9"  (1.753 m)   Wt 226 lb (102.5 kg)   BMI 33.37 kg/m       No data to display              No data to display              Objective:  Physical Exam:  Gen: NAD, comfortable in exam room  Bilateral feet/ankles: Pes planus right more than left.  No hallux valgus.  Rotation of 5th digits bilaterally.  No splaying.  No other gross deformity, swelling, ecchymoses Full range of motion No tenderness to palpation currently Negative ant drawer and negative talar tilt.   NV intact distally.   Assessment & Plan:  1. Foot pain - underlying pes planus.  ? Lateral column overload.  Will continue treatment with Dr. Agatha Horsfall.  Custom orthotics made today and felt comfortable.  He will consider additional pairs.  Patient was fitted for a : standard, cushioned, semi-rigid orthotic. The orthotic was heated and  afterward the patient stood on the orthotic blank positioned on the orthotic stand. The patient was positioned in subtalar neutral position and 10 degrees of ankle dorsiflexion in a weight bearing stance. After completion of molding, a stable base was applied to the orthotic blank. The blank was ground to a stable position for weight bearing. Size: 13 fit & run Base: none Posting: none Additional orthotic padding: none

## 2023-06-18 ENCOUNTER — Encounter: Payer: Self-pay | Admitting: Family Medicine

## 2023-06-18 ENCOUNTER — Ambulatory Visit (INDEPENDENT_AMBULATORY_CARE_PROVIDER_SITE_OTHER): Admitting: Family Medicine

## 2023-06-18 VITALS — BP 134/69 | Ht 69.0 in | Wt 225.0 lb

## 2023-06-18 DIAGNOSIS — M2142 Flat foot [pes planus] (acquired), left foot: Secondary | ICD-10-CM

## 2023-06-18 DIAGNOSIS — M79671 Pain in right foot: Secondary | ICD-10-CM | POA: Diagnosis not present

## 2023-06-18 DIAGNOSIS — M2141 Flat foot [pes planus] (acquired), right foot: Secondary | ICD-10-CM

## 2023-06-18 NOTE — Progress Notes (Signed)
 PCP: Elida Grounds, DO  Subjective:   HPI: Patient is a 83 y.o. male here for custom orthotics.  Patient is doing well with his pair of orthotics so returns for 2 additional pairs.  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HCC)    03-03-14 had VP shunt inserted   Pre-diabetes    Rupture of left quadriceps tendon 07/21/2014   Skin cancer, basal cell    Urinary incontinence     Current Outpatient Medications on File Prior to Visit  Medication Sig Dispense Refill   acetaminophen  (TYLENOL ) 500 MG tablet Take 500-1,000 mg by mouth 4 (four) times daily as needed for moderate pain.      ammonium lactate (AMLACTIN) 12 % cream Apply 1 Application topically as needed for dry skin.     amoxicillin  (AMOXIL ) 500 MG capsule Take 500 mg by mouth See admin instructions. Take 4 capsules 1 hour prior to dental procedures     apixaban  (ELIQUIS ) 5 MG TABS tablet Take 1 tablet (5 mg total) by mouth 2 (two) times daily. 180 tablet 3   atorvastatin  (LIPITOR) 10 MG tablet TAKE ONE TABLET BY MOUTH DAILY 90 tablet 3   clindamycin (CLEOCIN T) 1 % external solution Apply 1 Application topically 2 (two) times daily as needed (Rosacea).     GEMTESA 75 MG TABS Take 1 tablet by mouth every morning.     hydrALAZINE  (APRESOLINE ) 25 MG tablet TAKE ONE TABLET BY MOUTH THREE TIMES DAILY 270 tablet 3   ketoconazole (NIZORAL) 2 % shampoo Apply 1 application  topically once a week.     lansoprazole (PREVACID) 30 MG capsule Take 30 mg by mouth 2 (two) times daily.      metoprolol  succinate (TOPROL -XL) 50 MG 24 hr tablet Take with or immediately following a meal at night 90 tablet 3    metroNIDAZOLE (METROCREAM) 0.75 % cream Apply 1 application  topically 2 (two) times daily as needed (rosacea).     nabumetone (RELAFEN) 750 MG tablet Take 1,500 mg by mouth daily.      psyllium (REGULOID) 0.52 g capsule Take 1.56 g by mouth daily.     sacubitril -valsartan  (ENTRESTO ) 24-26 MG Take 1 tablet by mouth 2 (two) times daily. 180 tablet 3   spironolactone  (ALDACTONE ) 25 MG tablet Take 0.5 tablets (12.5 mg total) by mouth daily. 45 tablet 3   tolterodine (DETROL LA) 2 MG 24 hr capsule Take 2 mg by mouth every morning.     No current facility-administered medications on file prior to visit.    Past Surgical History:  Procedure Laterality Date   ANAL FISSURE REPAIR     AORTIC VALVE REPLACEMENT N/A 09/20/2016   Procedure: AORTIC VALVE REPLACEMENT (AVR) USING MAGNA EASE 23 MM PERICARDIAL TISSUE HEART VALVE MODEL 3300TFX;  Surgeon: Bartley Lightning, MD;  Location: [facility] OR;  Service: Open Heart Surgery;  Laterality: N/A;   BACK SURGERY     CARDIAC CATHETERIZATION     CARDIOVERSION N/A 04/10/2023   Procedure: CARDIOVERSION;  Surgeon: Lenise Quince, MD;  Location: [facility] INVASIVE CV LAB;  Service: Cardiovascular;  Laterality:  N/A;   COLONOSCOPY W/ POLYPECTOMY     CORONARY ARTERY BYPASS GRAFT N/A 09/20/2016   Procedure: CORONARY ARTERY BYPASS GRAFTING (CABG)x 3 WITH ENDOSCOPIC HARVESTING OF RIGHT SAPHENOUS VEIN;  Surgeon: Bartley Lightning, MD;  Location: [facility] OR;  Service: Open Heart Surgery;  Laterality: N/A;   ELBOW SURGERY Right    FINGER SURGERY     left shoulder RTC tear     LEG SURGERY     tendon repair right leg   MICRODISCECTOMY LUMBAR     l5-s1   QUADRICEPS TENDON REPAIR Left 07/21/2014   Procedure: REPAIR LEFT QUADRICEP TENDON;  Surgeon: Osa Blase, MD;  Location: [facility];  Service: Orthopedics;  Laterality: Left;   RIGHT/LEFT HEART CATH AND CORONARY ANGIOGRAPHY N/A 09/03/2016   Procedure: RIGHT/LEFT HEART CATH AND CORONARY ANGIOGRAPHY;  Surgeon: Lucendia Rusk, MD;  Location: [facility] INVASIVE CV LAB;  Service: Cardiovascular;  Laterality: N/A;   TEE WITHOUT CARDIOVERSION N/A 09/20/2016   Procedure: TRANSESOPHAGEAL ECHOCARDIOGRAM (TEE);  Surgeon: Bartley Lightning, MD;  Location: [facility] OR;  Service: Open Heart Surgery;  Laterality: N/A;   TONSILLECTOMY     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   Procedure: SHUNT INSERTION VENTRICULAR-PERITONEAL;  Surgeon: Isadora Mar, MD;  Location: [facility] NEURO ORS;  Service: Neurosurgery;  Laterality: Right;    Allergies  Allergen Reactions   Ace Inhibitors Cough   Atenolol Shortness Of Breath   Beta Adrenergic Blockers Shortness Of Breath    Moderate reaction   Erythromycin Diarrhea   Ciprofloxacin Other (See Comments)    ANYTHING RELATED TO CIPRO, TENDON RUPTURES     BP 134/69   Ht 5\' 9"  (1.753 m)   Wt 225 lb (102.1 kg)   BMI 33.23 kg/m       No data to display              No data to display              Objective:  Physical Exam:  Gen: NAD, comfortable in exam room  No changes from last visit.   Assessment & Plan:  1. Pes planus, foot pain and lateral column overload.    Patient was fitted for a : standard, cushioned, semi-rigid orthotic. The orthotic was heated and afterward the patient stood on the orthotic blank positioned on the orthotic stand. The patient was positioned in subtalar neutral position and 10 degrees of ankle dorsiflexion in a weight bearing stance. After completion of molding, a stable base was applied to the orthotic blank. The blank was ground to a stable position for weight bearing. Size: 13 fit and run - 2 sets Base:none Posting:none Additional orthotic padding:none

## 2023-07-07 ENCOUNTER — Ambulatory Visit (HOSPITAL_COMMUNITY)
Admission: RE | Admit: 2023-07-07 | Discharge: 2023-07-07 | Disposition: A | Discharge status: Home / Self Care | Source: Ambulatory Visit | Attending: Cardiology | Admitting: Cardiology

## 2023-07-07 DIAGNOSIS — I5022 Chronic systolic (congestive) heart failure: Secondary | ICD-10-CM | POA: Diagnosis not present

## 2023-07-07 LAB — ECHOCARDIOGRAM COMPLETE
AR max vel: 1.13 cm2
AV Area VTI: 1.24 cm2
AV Area mean vel: 1.15 cm2
AV Mean grad: 21 mmHg
AV Peak grad: 38.7 mmHg
Ao pk vel: 3.11 m/s
Area-P 1/2: 3.84 cm2
S' Lateral: 3.1 cm

## 2023-07-08 ENCOUNTER — Ambulatory Visit: Payer: Self-pay | Admitting: Physician Assistant

## 2023-07-09 DIAGNOSIS — L719 Rosacea, unspecified: Secondary | ICD-10-CM | POA: Diagnosis not present

## 2023-07-09 DIAGNOSIS — L570 Actinic keratosis: Secondary | ICD-10-CM | POA: Diagnosis not present

## 2023-07-09 DIAGNOSIS — L218 Other seborrheic dermatitis: Secondary | ICD-10-CM | POA: Diagnosis not present

## 2023-07-14 ENCOUNTER — Ambulatory Visit: Attending: Cardiology | Admitting: Cardiology

## 2023-07-14 ENCOUNTER — Encounter: Payer: Self-pay | Admitting: Cardiology

## 2023-07-14 VITALS — BP 130/62 | HR 73 | Ht 69.0 in | Wt 231.5 lb

## 2023-07-14 DIAGNOSIS — I5032 Chronic diastolic (congestive) heart failure: Secondary | ICD-10-CM | POA: Diagnosis not present

## 2023-07-14 DIAGNOSIS — I4819 Other persistent atrial fibrillation: Secondary | ICD-10-CM

## 2023-07-14 DIAGNOSIS — Z952 Presence of prosthetic heart valve: Secondary | ICD-10-CM | POA: Diagnosis not present

## 2023-07-14 DIAGNOSIS — Z951 Presence of aortocoronary bypass graft: Secondary | ICD-10-CM | POA: Diagnosis not present

## 2023-07-14 DIAGNOSIS — I10 Essential (primary) hypertension: Secondary | ICD-10-CM | POA: Diagnosis not present

## 2023-07-14 DIAGNOSIS — I4891 Unspecified atrial fibrillation: Secondary | ICD-10-CM

## 2023-07-14 DIAGNOSIS — I2510 Atherosclerotic heart disease of native coronary artery without angina pectoris: Secondary | ICD-10-CM | POA: Diagnosis not present

## 2023-07-14 DIAGNOSIS — I480 Paroxysmal atrial fibrillation: Secondary | ICD-10-CM

## 2023-07-14 MED ORDER — ENTRESTO 49-51 MG PO TABS: 1.0000 | ORAL_TABLET | Freq: Two times a day (BID) | ORAL | 3 refills | Status: AC

## 2023-07-14 NOTE — Patient Instructions (Signed)
 Medication Instructions:   Complete the current bottle of Hydralazine   three times a day and Entresto  25/26 mg twice a day.   Once the above medication have  been taken stop Hydralazine  and increase Entresto  49/51 mg    *If you need a refill on your cardiac medications before your next appointment, please call your pharmacy*   Lab Work: Not needed    Testing/Procedures: Not needed   Follow-Up: At [facility], you and your health needs are our priority.  As part of our continuing mission to provide you with exceptional heart care, we have created designated Provider Care Teams.  These Care Teams include your primary Cardiologist (physician) and Advanced Practice Providers (APPs -  Physician Assistants and Nurse Practitioners) who all work together to provide you with the care you need, when you need it.     Your next appointment:   6 month(s)  The format for your next appointment:   In Person  Provider:   Alm Clay, MD or Jon Hails, PA-C

## 2023-07-14 NOTE — Progress Notes (Unsigned)
 Cardiology Office Note:  .   Date:  07/15/2023  ID:  Joseph [name], DOB 1940-07-16, MRN [id number] PCP: Joseph Opal, DO  [facility] Providers Cardiologist:  Joseph Clay, MD Cardiology APP:  Joseph [name], Joseph [name]     Chief Complaint  Patient presents with   Follow-up    16-month follow-up   Atrial Fibrillation    As far as No Breakthrough since Cardioversion   Congestive Heart Failure    Weights have been stable at roughly 228 and 230 pounds-not currently on diuretic   Coronary Artery Disease    No active angina   Cardiac Valve Problem    Valve function normal on recent echo    Patient Profile: .     Joseph [name] is a moderately obese 83 y.o. male  with a PMH notable for CAD/AAS (s/p CABG/AVR), HFmrEF, HTN, HLD, PAF who presents here for 78-month follow-up at the Joseph [name], Joseph [name]  Former patient of Dr. JINNY [name], who has been seen on multiple occasions by different APP's.  Most recently by Joseph Madie, PA on 04/16/2023 following cardioversion/ Referring Physician: Auston Opal, DO.  PMH: CAD-CABG/AVR 09/20/2016 Postop A-fib (now on diltiazem -having stopped amiodarone ; OAC with warfarin) Recurrent A-fib status post cardioversion HFmrEF (EF 40 to 45%)- > recent echo EF 35 to 40% while in AFib - now back to  Normal  On Lasix  40 mg daily, Entresto  24 over 26 mg twice daily, Cardizem  stopped and placed on Lopressor  25 mg twice daily.  Also switched from warfarin to Eliquis     Joseph [name] was last seen on 04/16/2023 for follow-up after cardioversion by Joseph Madie, PA.  She had initially seen him on February 18 for dyspnea-started on Lasix  with potassium supplementation.  3 days later was seen on 03/14/2023 noted more increased sleep but no chest pain or edema.  Noted A-fib in the 90s.  Thought that A-fib was contributing to CHF. => Cardioverted 04/10/2023.  Feeling well post cardioversion.  Weights were in the 228-230 range.  Taking Lasix  more PRN.  Unaware PACs.   Beta-blocker due to Toprol .  They discussed ways of monitoring for recurrent A-fib.  Considered switching spironolactone  for SGLT2 in order.  Subjective  Discussed the use of AI scribe software for clinical note transcription with the patient, who gave verbal consent to proceed.  History of Present Illness History of Present Illness Joseph [name] for 51-month follow-up after cardioversion of Symptomatic A-fib/HFmREF to discuss results of follow-up echo..  He has a history of aortic stenosis and underwent valve replacement with a 23 mm Joseph [name] bioprosthetic valve during his bypass surgery. He was asymptomatic prior to the surgery, with the murmur being the only indication of his condition. He was informed that he had a heart attack-CAD noted on pre-TAVR cardiac cath.  As a result, he was planned for CABG/AVR as opposed to simple TAVR. He underwent coronary artery bypass surgery in 2018 and subsequently experienced atrial fibrillation while in the ICU, which he initially did not recognize. He experienced atrial fibrillation again in February 2025, which was associated with symptoms of heart failure, including significant shortness of breath. He underwent cardioversion on April 10, 2023, which successfully restored normal heart rhythm.  No shortness of breath since February 2025 and no current symptoms of irregular heartbeat or chest pain. He sleeps on one pillow and does not wake up short of breath at night.  Stable lower extremity edema, but does not have a diuretic ordered.  No  active exertional or rest angina or dyspnea.  He did not necessarily feel irregular heartbeats when he was in A-fib, he simply had profound dyspnea and fatigue and he has not had that symptom since cardioversion.  Overall feels much improved since the cardioversion. He monitors his weight, maintaining it between 228 and 230 pounds.   He is currently on a medication regimen that includes Toprol  25 mg, Eliquis , Entresto  24/26 mg  twice a day, spironolactone  12.5 mg daily, and hydralazine . .  He has a history of ruptured quadriceps tendons, which affects his mobility, making walking difficult. He uses a tripod for support and finds it convenient for mobility. He engages in light physical activity, and asked about potentially using a stationary pedal bike.   Objective  Medications - Toprol  50 mg - Eliquis  5 mg twice daily - Entresto  24/26 mg twice a day - Spironolactone  12.5 mg a day - Hydralazine  25 mg 3 times daily -Atorvastatin  10 mg daily - Potassium - Magnesium  Psyllium 1.5 mg daily; nabumetone 1500 mg daily; Prevacid 30 mg twice daily; Gemtesa 75 mg every morning;  -Standing prescription for SBE prophylaxis-amoxicillin  500 mg.  Studies Reviewed: SABRA   EKG Interpretation Date/Time:  Monday July 14 2023 14:12:29 EDT Ventricular Rate:  73 PR Interval:  216 QRS Duration:  106 QT Interval:  398 QTC Calculation: 438 R Axis:   41  Text Interpretation: Sinus rhythm with 1st degree A-V block Incomplete left bundle branch block Nonspecific T wave abnormality When compared with ECG of 16-Apr-2023 14:49, Premature ventricular complexes are no longer Present Confirmed by Anner [name] ([id number]) on 07/14/2023 2:20:37 PM    ECHO: EF normalized back to 55 to 60%.  No RWMA.  GR 2 DD.  Moderate LA dilation.  Normal RV.  23 magna bioprosthetic AoV with no Aortic Stenosis.  Normal RAP.  (07/07/2023) Echo: EF 35 to 40% with global hypokinesis.  Mild LVH.  Abnormal global strain.  Mild to moderately reduced RV function.  Unable to assess PAP.  Moderate MR with moderate MAC.  23 mm magna bioprosthetic aortic valve in place.  Mean AVG 12.7 mmHg. => EF had reduced from 40 and 45% to 35-40% (03/14/2023-in the setting of A-fib) R&LHC-Cors: mLAD 90%, mRCA 100% (L-R collaterals), p-mCx 50%, RI 25%; CO 5.8 L/min. CI 2.4. Ao sat 96%. PA sat 71%. => Referred for CABG/AVR => LIMA-LAD, SVG-diagonal, SVG-RCA; 23 mm [facility] Ease pericardial  valve placed in the aortic position.  Labs 02/14/2023: TC 123, TG 51, HDL 51, LDL 50; A1c 6.0. Lab Results  Component Value Date   NA 144 04/16/2023   K 4.0 04/16/2023   CREATININE 1.11 04/16/2023   EGFR 67 04/16/2023   GLUCOSE 91 04/16/2023   Lab Results  Component Value Date   TSH 1.210 04/16/2023   Risk Assessment/Calculations:    CHA2DS2-VASc Score = 5   This indicates a 7.2% annual risk of stroke. The patient's score is based upon: CHF History: 1 HTN History: 1 Diabetes History: 0 Stroke History: 0 Vascular Disease History: 1 Age Score: 2 Gender Score: 0            Physical Exam:   VS:  BP 130/62 (BP Location: Right Arm, Patient Position: Sitting, Cuff Size: Normal)   Pulse 73   Ht 5' 9 (1.753 m)   Wt 231 lb 8 oz (105 kg)   SpO2 94%   BMI 34.19 kg/m    Wt Readings from Last 3 Encounters:  07/14/23 231 lb 8  oz (105 kg)  06/18/23 225 lb (102.1 kg)  05/28/23 226 lb (102.5 kg)    GEN: Well nourished, well groomed in no acute distress; moderately obese NECK: No JVD; No carotid bruits CARDIAC: Normal S1, S2; RRR, 2/6 SEM at RUSB-carotids; no other murmurs, rubs, gallops RESPIRATORY:  Clear to auscultation without rales, wheezing or rhonchi ; nonlabored, good air movement. ABDOMEN: Soft, non-tender, non-distended EXTREMITIES: Trivial edema; No deformity; bilateral knee surgery scars in place.     ASSESSMENT AND PLAN: .    Problem List Items Addressed This Visit       Cardiology Problems   (HFpEF) heart failure with preserved ejection fraction (HCC) - Primary (Chronic)   Recent exacerbation in the setting of A-fib-had pretty significant CHF symptoms with worsening EF.  Now back to normal EF of 55 to 60%.  The presence of moderate LA dilation would indicate significant diastolic dysfunction which would exacerbate in the setting of A-fib.  - At present, seems to maintaining sinus rhythm, but may require antiarrhythmic therapy if symptoms recur frequently. -  For now continue rate control with Toprol  50 mg daily - Consolidate afterload reduction by discontinuing hydralazine  and increasing Entresto  to 49-51 mg upon completion of current prescription course which should be about 6 more weeks. -Continue spironolactone  12.5 mg daily ( 1/2 of 25 mg tablet) - Provide as-needed Lasix  for weight gain of 3 pounds in a day or 5 pounds in a week. - Encourage regular exercise such as swimming/water walking or stationary biking.       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 Required cardioversion after initiation of Eliquis . Currently in sinus rhythm. Discussed recurrence risk, especially with illness or stress. - Monitor for symptoms such as irregular heartbeat or shortness of breath similar to his CHF symptoms that he had. - Advise use of Fitbit (or potentially Kardia-Mobile) to monitor heart rate and rhythm. - Seek EKG if symptoms recur. -Continue rate control with Toprol  50 mg daily -Continue Eliquis  5 mg twice daily      Relevant Medications   sacubitril -valsartan  (ENTRESTO ) 49-51 MG   Primary hypertension (Chronic)   BP pretty well-controlled on current regimen which includes hydralazine  25 mg 3 times daily, Toprol  50 mg daily and Entresto  24-26 mg twice daily with spironolactone  12.5 mg daily. -Will consolidate upon completion of current prescription cycle-DC hydralazine  and increase Entresto  to 49-51 mg twice daily.      Relevant Medications   sacubitril -valsartan  (ENTRESTO ) 49-51 MG     Other   S/P AVR (Chronic)   Bioprosthetic valve placed in 2018.  Valve  functioning well on current echo. Turbulence leads to murmur on exam -SBE prophylaxis-amoxicillin .      S/P CABG x 3 (Chronic)   No active angina symptoms.  Would hold off on ischemic evaluation unless he were to have symptoms.      Other Visit Diagnoses       Essential hypertension       Relevant Medications   sacubitril -valsartan  (ENTRESTO ) 49-51 MG   Other Relevant Orders   EKG 12-Lead (Completed)              Follow-Up:  Return in about 6 months (around 01/13/2024) for Alternate 6 month follow-up with APP & MD.  Total time spent: 26 min spent with patient + 28 min spent charting = 54 min I spent 54 minutes in the care of Joseph [name] today including reviewing labs (2 minutes), reviewing studies (10 minutes reviewing echocardiograms, comparing to previous studies; cath films and report and's report), face to face time discussing treatment options (26), reviewing records from most recent NP visits/cardioversion etc. (8 minutes), 8 minutes dictating, and documenting in the encounter.     Signed, Joseph MICAEL Clay, MD, MS Joseph [name], M.D., M.S. Interventional Chartered certified accountant  Pager # [phone]

## 2023-07-15 ENCOUNTER — Encounter: Payer: Self-pay | Admitting: Cardiology

## 2023-07-15 DIAGNOSIS — I5030 Unspecified diastolic (congestive) heart failure: Secondary | ICD-10-CM | POA: Insufficient documentation

## 2023-07-15 MED ORDER — FUROSEMIDE 20 MG PO TABS: ORAL_TABLET | ORAL | 11 refills | Status: AC

## 2023-07-15 NOTE — Assessment & Plan Note (Signed)
 Initially had postop A-fib but did not have any further symptoms until February 2025 when he had symptoms of heart failure and noted to be in A-fib. Required cardioversion after initiation of Eliquis . Currently in sinus rhythm. Discussed recurrence risk, especially with illness or stress. - Monitor for symptoms such as irregular heartbeat or shortness of breath similar to his CHF symptoms that he had. - Advise use of Fitbit (or potentially Kardia-Mobile) to monitor heart rate and rhythm. - Seek EKG if symptoms recur. -Continue rate control with Toprol  50 mg daily -Continue Eliquis  5 mg twice daily

## 2023-07-15 NOTE — Assessment & Plan Note (Signed)
 Recent exacerbation in the setting of A-fib-had pretty significant CHF symptoms with worsening EF.  Now back to normal EF of 55 to 60%.  The presence of moderate LA dilation would indicate significant diastolic dysfunction which would exacerbate in the setting of A-fib.  - At present, seems to maintaining sinus rhythm, but may require antiarrhythmic therapy if symptoms recur frequently. - For now continue rate control with Toprol  50 mg daily - Consolidate afterload reduction by discontinuing hydralazine  and increasing Entresto  to 49-51 mg upon completion of current prescription course which should be about 6 more weeks. -Continue spironolactone  12.5 mg daily ( 1/2 of 25 mg tablet) - Provide as-needed Lasix  for weight gain of 3 pounds in a day or 5 pounds in a week. - Encourage regular exercise such as swimming/water walking or stationary biking.

## 2023-07-15 NOTE — Assessment & Plan Note (Signed)
 Bioprosthetic valve placed in 2018.  Valve functioning well on current echo. Turbulence leads to murmur on exam -SBE prophylaxis-amoxicillin .

## 2023-07-15 NOTE — Assessment & Plan Note (Signed)
 No active angina symptoms.  Would hold off on ischemic evaluation unless he were to have symptoms.

## 2023-07-15 NOTE — Telephone Encounter (Signed)
 Yes.  I may not have made that clear during signout to my nurse. => Would like to do Lasix  20 mg PRN weight gain more than 3 pounds in 1 day, or weight gain more than 5 pounds in 1 week, or for worsening edema/exertional dyspnea  Lasix  20 mg; 1-2 tab PRN Wgt gain / Edema (as Directed - per above); Disp #20 - 11 refills  DH

## 2023-07-15 NOTE — Assessment & Plan Note (Signed)
 BP pretty well-controlled on current regimen which includes hydralazine  25 mg 3 times daily, Toprol  50 mg daily and Entresto  24-26 mg twice daily with spironolactone  12.5 mg daily. -Will consolidate upon completion of current prescription cycle-DC hydralazine  and increase Entresto  to 49-51 mg twice daily.

## 2023-08-06 DIAGNOSIS — L570 Actinic keratosis: Secondary | ICD-10-CM | POA: Diagnosis not present

## 2023-08-06 DIAGNOSIS — L821 Other seborrheic keratosis: Secondary | ICD-10-CM | POA: Diagnosis not present

## 2023-08-06 DIAGNOSIS — L578 Other skin changes due to chronic exposure to nonionizing radiation: Secondary | ICD-10-CM | POA: Diagnosis not present

## 2023-08-06 DIAGNOSIS — D1801 Hemangioma of skin and subcutaneous tissue: Secondary | ICD-10-CM | POA: Diagnosis not present

## 2023-08-06 DIAGNOSIS — L814 Other melanin hyperpigmentation: Secondary | ICD-10-CM | POA: Diagnosis not present

## 2023-08-06 DIAGNOSIS — L905 Scar conditions and fibrosis of skin: Secondary | ICD-10-CM | POA: Diagnosis not present

## 2023-09-11 ENCOUNTER — Telehealth: Payer: Self-pay | Admitting: Physician Assistant

## 2023-09-11 NOTE — Telephone Encounter (Signed)
 Pt returning nurse's call. Please advise

## 2023-09-11 NOTE — Telephone Encounter (Signed)
 Called and spoke with patient. Advised patient ok to take Imodium per Chris Pavero, [facility]. Patient verbalized understanding.

## 2023-09-11 NOTE — Telephone Encounter (Signed)
 Pt would like a c/b regarding whether he is able to take Imodium being that he has heart issues. Please advise

## 2023-09-11 NOTE — Telephone Encounter (Signed)
 Attempted to contact patient. Left message to call back on personal voicemail.

## 2023-09-11 NOTE — Telephone Encounter (Signed)
 Called and spoke with patient. Let patient know his question about taking Imodium was routed to the pharmacy team to review and advise. Advised patient he will be contacted when response is given. Patient verbalized understanding and agreeable to plan.

## 2023-10-16 ENCOUNTER — Encounter: Payer: Self-pay | Admitting: Cardiology

## 2023-11-05 DIAGNOSIS — L608 Other nail disorders: Secondary | ICD-10-CM | POA: Diagnosis not present

## 2023-11-05 DIAGNOSIS — L578 Other skin changes due to chronic exposure to nonionizing radiation: Secondary | ICD-10-CM | POA: Diagnosis not present

## 2023-11-06 NOTE — Telephone Encounter (Signed)
 Already addressed

## 2023-11-24 DIAGNOSIS — H52203 Unspecified astigmatism, bilateral: Secondary | ICD-10-CM | POA: Diagnosis not present

## 2023-11-24 DIAGNOSIS — Z961 Presence of intraocular lens: Secondary | ICD-10-CM | POA: Diagnosis not present

## 2023-11-24 DIAGNOSIS — H26491 Other secondary cataract, right eye: Secondary | ICD-10-CM | POA: Diagnosis not present

## 2024-01-04 ENCOUNTER — Other Ambulatory Visit: Payer: Self-pay | Admitting: Physician Assistant

## 2024-01-06 MED ORDER — SPIRONOLACTONE 25 MG PO TABS: 12.5000 mg | ORAL_TABLET | Freq: Every day | ORAL | 1 refills | Status: AC

## 2024-01-18 NOTE — Progress Notes (Unsigned)
 " Cardiology Office Note:    Date:  01/20/2024   ID:  Joseph [name], DOB 11-25-1940, MRN [id number]  PCP:  Auston Opal, DO   [facility] Providers Cardiologist:  Alm Clay, MD Cardiology APP:  Madie Jon Garre, PA     Referring MD: Auston Opal, DO   Chief Complaint  Patient presents with   Follow-up    CHF, PAF    History of Present Illness:    Joseph [name] is a 83 y.o. 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  Amiodarone  subsequently stopped 11/2016 and Coumadin  stopped 01/2017.  He has maintained normal sinus rhythm.  He understands SBE prophylaxis.  Echocardiogram following COVID-pneumonia in 12/2021 showed an LVEF 40-45%-similar to his last echo.  Good aortic valve function.  He called our office 03/11/2023 with shortness of breath and was started on Lasix  40 mg along with 20 mEq potassium supplementation.  He was added to my schedule. During my interview 03/14/23 he reports that he has been sleeping a lot, denies chest pain or edema.  proBNP was 1426.  EKG that day with atrial fibrillation that was rate controlled in the 90s.  Had high suspicion that new onset A-fib may be contributing to his heart failure symptoms.   I escalated his GDMT to include 40 mg Lasix  daily, 24-26 mg Entresto  twice daily, and 25 mg Lopressor  twice daily.  I opted to stop Cardizem  given his history of reduced EF.  I started him on anticoagulation with 5 mg Eliquis  twice daily.  Of note, he has listed allergies to atenolol and losartan , both of which he did not recall. He has subsequently tolerated both. Repeat echocardiogram showed an LVEF of 35-40%, mild LVH, moderately reduced RV function, moderate MR, AVR with good function.   He did well with medication changes but remained in rate controlled atrial fibrillation.  I added 12.5 mg spironolactone  and stopped his Lasix /potassium.   Follow-up BMP stable.  He proceeded to DCCV after at least 3 weeks of oral anticoagulation.  He was cardioverted to sinus rhythm on 04/10/2023.  Last seen by Dr. Clay 07/14/2023.  Repeat echocardiogram 07/07/2023 with improved LVEF back to 55-60%, grade 2 DD, normalized RV function, and normal functioning aortic valve.  He presents today for routine follow-up. He does reports once weekly chest pain that does not correlate with activity, but is new in the last couple of months. CP lasts for about 1 minute, has not taken NTG.   He presents a weight log - dry weight is near 235-238 lbs. Has taken intermittent lasix  20 mg for weight increase.    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HCC)    03-03-14 had VP shunt inserted   Pre-diabetes    Rupture of left quadriceps tendon 07/21/2014   Skin cancer, basal cell    Urinary incontinence     Past Surgical History:  Procedure Laterality Date   ANAL FISSURE REPAIR     AORTIC VALVE REPLACEMENT N/A 09/20/2016  Procedure: AORTIC VALVE REPLACEMENT (AVR) USING MAGNA EASE 23 MM PERICARDIAL TISSUE HEART VALVE MODEL 3300TFX;  Surgeon: Lucas Dorise POUR, MD;  Location: [facility] OR;  Service: Open Heart Surgery;  Laterality: N/A;   BACK SURGERY     CARDIAC CATHETERIZATION     CARDIOVERSION N/A 04/10/2023   Procedure: CARDIOVERSION;  Surgeon: Pietro Redell RAMAN, MD;  Location: [facility] INVASIVE CV LAB;  Service: Cardiovascular;  Laterality: N/A;   COLONOSCOPY W/ POLYPECTOMY     CORONARY ARTERY BYPASS GRAFT N/A 09/20/2016   Procedure: CORONARY ARTERY BYPASS GRAFTING (CABG)x 3 WITH ENDOSCOPIC HARVESTING OF RIGHT SAPHENOUS VEIN;  Surgeon:  Lucas Dorise POUR, MD;  Location: [facility] OR;  Service: Open Heart Surgery;  Laterality: N/A;   ELBOW SURGERY Right    FINGER SURGERY     left shoulder RTC tear     LEG SURGERY     tendon repair right leg   MICRODISCECTOMY LUMBAR     l5-s1   QUADRICEPS TENDON REPAIR Left 07/21/2014   Procedure: REPAIR LEFT QUADRICEP TENDON;  Surgeon: Fonda Olmsted, MD;  Location: [facility];  Service: Orthopedics;  Laterality: Left;   RIGHT/LEFT HEART CATH AND CORONARY ANGIOGRAPHY N/A 09/03/2016   Procedure: RIGHT/LEFT HEART CATH AND CORONARY ANGIOGRAPHY;  Surgeon: Dann Candyce RAMAN, MD;  Location: [facility] INVASIVE CV LAB;  Service: Cardiovascular;  Laterality: N/A;   TEE WITHOUT CARDIOVERSION N/A 09/20/2016   Procedure: TRANSESOPHAGEAL ECHOCARDIOGRAM (TEE);  Surgeon: Lucas Dorise POUR, MD;  Location: [facility] OR;  Service: Open Heart Surgery;  Laterality: N/A;   TONSILLECTOMY     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   Procedure: SHUNT INSERTION VENTRICULAR-PERITONEAL;  Surgeon: Alm RAMAN Molt, MD;  Location: [facility] NEURO ORS;  Service: Neurosurgery;  Laterality: Right;    Current Medications: Active Medications[1]   Allergies:   Ace inhibitors, Atenolol, Beta adrenergic blockers, Erythromycin, and Ciprofloxacin   Social History   Socioeconomic History   Marital status: Married    Spouse name: Not on file   Number of children: Not on file   Years of education: Not on file   Highest education level: Not on file  Occupational History   Not on file  Tobacco Use   Smoking status: Former    Types: Cigarettes, Cigars, Pipe   Smokeless tobacco: Never   Tobacco comments:    Quit in the 80's  Vaping Use   Vaping status: Never Used  Substance and Sexual Activity   Alcohol use: Yes    Alcohol/week: 2.0 - 3.0 standard drinks of alcohol    Types: 2 - 3 Glasses of wine per week    Comment: occasional 1-2 times week   Drug use: No   Sexual activity: Not Currently  Other Topics Concern   Not on file   Social History Narrative   Not on file   Social Drivers of Health   Tobacco Use: Medium Risk (01/20/2024)   Patient History    Smoking Tobacco Use: Former    Smokeless Tobacco Use: Never    Passive Exposure: Not on Actuary Strain: Not on file  Food Insecurity: Not on file  Transportation Needs: Not on file  Physical Activity: Not on file  Stress: Not on file  Social Connections: Not on file  Depression (EYV7-0): Not on file  Alcohol Screen: Not on file  Housing: Not on file  Utilities: Not on file  Health Literacy: Not on file     Family History: The patient's family history includes Heart attack in his  maternal grandmother; Heart disease in his mother. There is no history of Hypertension or Stroke.  ROS:   Please see the history of present illness.     All other systems reviewed and are negative.  EKGs/Labs/Other Studies Reviewed:    The following studies were reviewed today:  EKG Interpretation Date/Time:  Tuesday January 20 2024 12:22:13 EST Ventricular Rate:  60 PR Interval:  262 QRS Duration:  100 QT Interval:  434 QTC Calculation: 434 R Axis:   44  Text Interpretation: Sinus rhythm with 1st degree A-V block When compared with ECG of 14-Jul-2023 14:12, Nonspecific T wave abnormality no longer evident in Inferior leads Confirmed by Madie Slough ([id number]) on 01/20/2024 12:23:24 PM    Recent Labs: 03/11/2023: NT-Pro BNP 1,426 03/20/2023: BNP 128.5 04/08/2023: Hemoglobin 10.5; Platelets 360 04/16/2023: BUN 13; Creatinine, Ser 1.11; Magnesium  1.8; Potassium 4.0; Sodium 144; TSH 1.210  Recent Lipid Panel    Component Value Date/Time   CHOL 130 01/31/2017 0855   TRIG 110 01/31/2017 0855   HDL 62 01/31/2017 0855   CHOLHDL 2.1 01/31/2017 0855   LDLCALC 46 01/31/2017 0855     Risk Assessment/Calculations:    CHA2DS2-VASc Score = 5   This indicates a 7.2% annual risk of stroke. The patient's score is based upon: CHF History: 1 HTN History:  1 Diabetes History: 0 Stroke History: 0 Vascular Disease History: 1 Age Score: 2 Gender Score: 0            Physical Exam:    VS:  BP (!) 124/50 (BP Location: Right Arm, Patient Position: Sitting, Cuff Size: Normal)   Pulse 61   Resp 16   Ht 5' 9.5 (1.765 m)   Wt 240 lb 6.4 oz (109 kg)   BMI 34.99 kg/m     Wt Readings from Last 3 Encounters:  01/20/24 240 lb 6.4 oz (109 kg)  07/14/23 231 lb 8 oz (105 kg)  06/18/23 225 lb (102.1 kg)     GEN:  Well nourished, well developed in no acute distress HEENT: Normal NECK: No JVD; No carotid bruits LYMPHATICS: No lymphadenopathy CARDIAC: RRR, no murmurs, rubs, gallops RESPIRATORY:  Clear to auscultation without rales, wheezing or rhonchi  ABDOMEN: Soft, non-tender, non-distended MUSCULOSKELETAL:  No edema; No deformity  SKIN: Warm and dry NEUROLOGIC:  Alert and oriented x 3 PSYCHIATRIC:  Normal affect   ASSESSMENT:    1. Chest pain of uncertain etiology   2. Hx of CABG   3. Chronic systolic heart failure (HCC)   4. PAF (paroxysmal atrial fibrillation) (HCC)   5. Chronic anticoagulation   6. Primary hypertension   7. Hyperlipidemia with target LDL less than 70   8. S/P AVR    PLAN:    In order of problems listed above:  Chest pain - he does report intermittent chest pain that started 2 months ago - while the pain is not life limiting, it is occurring weekly, brief in duration - discussed that we may need to proceed with an ischemic evaluation if frequency or quality increase - for now, will stop hydralazine  25 mg TID and add 5 mg amlodipine at night with next pill pack - I opted for amlodipine over imdur (even though CHF) for possible vasopasm - will also provide PRN NTG to use - EKG stable from prior - they will keep a log of CP with time of day, activity, and anticipated meal to get a better handle on associated symptoms - will follow up in 3 months  and can consider PET stress test at that time   CAD s/p CABG  2018 - no ASA given eliquis    Chronic systolic and diastolic heart failure - LVEF historically 40-45% --> 35-40% with RV dysfunction and MR on 03/20/2023 -- GDMT with toprol , Entresto , spironolactone , and jardiance - As needed Lasix  20 mg - With restoration of sinus rhythm, echocardiogram was repeated and shows improved BiV function - Will continue GDMT with the above change from hydralazine  to amlodipine as an anti-anginal    PAF -- History of postop A-fib after his CABG in 2018 with recurrence in 2025 - Recurrence of A-fib noted in the setting of shortness of breath and further reduced LVEF in 2025 - Now anticoagulated with 5 mg Eliquis  twice daily - DCCV on 04/10/2023 with restoration of sinus rhythm   Aortic stenosis s/p AVR 2018 - Good valve function on echocardiogram 2023 - SBE prophylaxis   Hypertension -Managed in the context of CHF   Hyperlipidemia with LDL goal less than 70 - Continue Lipitor 10 mg - LDL 50 when checked 07/2023   Morbid obesity - Working on weight loss   Follow up in 3 months.            Medication Adjustments/Labs and Tests Ordered: Current medicines are reviewed at length with the patient today.  Concerns regarding medicines are outlined above.  Orders Placed This Encounter  Procedures   EKG 12-Lead   Meds ordered this encounter  Medications   nitroGLYCERIN  (NITROSTAT ) 0.4 MG SL tablet    Sig: Place 1 tablet (0.4 mg total) under the tongue every 5 (five) minutes as needed for chest pain (MAY TAKE UP TO 3 TABLETS, THEN CALL 911).    Dispense:  25 tablet    Refill:  11   amLODipine (NORVASC) 5 MG tablet    Sig: Take 1 tablet (5 mg total) by mouth daily.    Dispense:  90 tablet    Refill:  3    Patient Instructions  Medication Instructions:  STOP HYDRALAZINE  25MG   START AMLODIPINE 5 MG IN THE EVENING *If you need a refill on your cardiac medications before your next appointment, please call your pharmacy*    Follow-Up: At  [facility], you and your health needs are our priority.  As part of our continuing mission to provide you with exceptional heart care, our providers are all part of one team.  This team includes your primary Cardiologist (physician) and Advanced Practice Providers or APPs (Physician Assistants and Nurse Practitioners) who all work together to provide you with the care you need, when you need it.  Your next appointment:   3 month(s)  Provider:   Alm Clay, MD   We recommend signing up for the patient portal called MyChart.  Sign up information is provided on this After Visit Summary.  MyChart is used to connect with patients for Virtual Visits (Telemedicine).  Patients are able to view lab/test results, encounter notes, upcoming appointments, etc.  Non-urgent messages can be sent to your provider as well.   To learn more about what you can do with MyChart, go to [email or web address].   Other Instructions PLEASE CHECK YOUR WEIGHT AND BLOOD PRESSURE DAILY.            Signed, Jon Garre Laytoya Ion, PA  01/20/2024 2:03 PM    [facility]     [1]  Current Meds  Medication Sig   acetaminophen  (TYLENOL ) 500 MG tablet Take 500-1,000 mg by mouth 4 (  four) times daily as needed for moderate pain.    amLODipine (NORVASC) 5 MG tablet Take 1 tablet (5 mg total) by mouth daily.   ammonium lactate (AMLACTIN) 12 % cream Apply 1 Application topically as needed for dry skin.   amoxicillin  (AMOXIL ) 500 MG capsule Take 500 mg by mouth See admin instructions. Take 4 capsules 1 hour prior to dental procedures   apixaban  (ELIQUIS ) 5 MG TABS tablet Take 1 tablet (5 mg total) by mouth 2 (two) times daily.   atorvastatin  (LIPITOR) 10 MG tablet TAKE ONE TABLET BY MOUTH EVERY DAY   clindamycin (CLEOCIN T) 1 % external solution Apply 1 Application topically 2 (two) times daily as needed (Rosacea).   furosemide  (LASIX ) 20 MG tablet Take Furosemide  20 mg daily as needed  for weight gain  more than 3 pounds in 1 day, or weight gain more than 5 pounds in 1 week, or for worsening edema/exertional shortness of breath.   GEMTESA 75 MG TABS Take 1 tablet by mouth every morning.   ketoconazole (NIZORAL) 2 % shampoo Apply 1 application  topically once a week.   lansoprazole (PREVACID) 30 MG capsule Take 30 mg by mouth 2 (two) times daily.    metoprolol  succinate (TOPROL -XL) 50 MG 24 hr tablet Take with or immediately following a meal at night   metroNIDAZOLE (METROCREAM) 0.75 % cream Apply 1 application  topically 2 (two) times daily as needed (rosacea).   nabumetone (RELAFEN) 750 MG tablet Take 1,500 mg by mouth daily.    nitroGLYCERIN  (NITROSTAT ) 0.4 MG SL tablet Place 1 tablet (0.4 mg total) under the tongue every 5 (five) minutes as needed for chest pain (MAY TAKE UP TO 3 TABLETS, THEN CALL 911).   psyllium (REGULOID) 0.52 g capsule Take 1.56 g by mouth daily.   sacubitril -valsartan  (ENTRESTO ) 49-51 MG Take 1 tablet by mouth 2 (two) times daily.   spironolactone  (ALDACTONE ) 25 MG tablet Take 0.5 tablets (12.5 mg total) by mouth daily.   tolterodine (DETROL LA) 2 MG 24 hr capsule Take 2 mg by mouth every morning.   [DISCONTINUED] hydrALAZINE  (APRESOLINE ) 25 MG tablet TAKE ONE TABLET BY MOUTH THREE TIMES DAILY   "

## 2024-01-20 ENCOUNTER — Encounter: Payer: Self-pay | Admitting: Physician Assistant

## 2024-01-20 ENCOUNTER — Telehealth: Payer: Self-pay | Admitting: Physician Assistant

## 2024-01-20 ENCOUNTER — Ambulatory Visit: Attending: Physician Assistant | Admitting: Physician Assistant

## 2024-01-20 VITALS — BP 124/50 | HR 61 | Resp 16 | Ht 69.5 in | Wt 240.4 lb

## 2024-01-20 DIAGNOSIS — I5022 Chronic systolic (congestive) heart failure: Secondary | ICD-10-CM | POA: Diagnosis not present

## 2024-01-20 DIAGNOSIS — Z952 Presence of prosthetic heart valve: Secondary | ICD-10-CM

## 2024-01-20 DIAGNOSIS — E785 Hyperlipidemia, unspecified: Secondary | ICD-10-CM

## 2024-01-20 DIAGNOSIS — I480 Paroxysmal atrial fibrillation: Secondary | ICD-10-CM | POA: Diagnosis not present

## 2024-01-20 DIAGNOSIS — I10 Essential (primary) hypertension: Secondary | ICD-10-CM | POA: Diagnosis not present

## 2024-01-20 DIAGNOSIS — R079 Chest pain, unspecified: Secondary | ICD-10-CM | POA: Diagnosis not present

## 2024-01-20 DIAGNOSIS — Z7901 Long term (current) use of anticoagulants: Secondary | ICD-10-CM

## 2024-01-20 DIAGNOSIS — Z951 Presence of aortocoronary bypass graft: Secondary | ICD-10-CM | POA: Diagnosis not present

## 2024-01-20 MED ORDER — AMLODIPINE BESYLATE 5 MG PO TABS: 5.0000 mg | ORAL_TABLET | Freq: Every day | ORAL | 3 refills | Status: DC

## 2024-01-20 MED ORDER — NITROGLYCERIN 0.4 MG SL SUBL: 0.4000 mg | SUBLINGUAL_TABLET | SUBLINGUAL | 11 refills | Status: AC | PRN

## 2024-01-20 NOTE — Telephone Encounter (Signed)
 Pt c/o medication issue:  1. Name of Medication:   amLODipine (NORVASC) 5 MG tablet     2. How are you currently taking this medication (dosage and times per day)? Take 1 tablet (5 mg total) by mouth daily.   3. Are you having a reaction (difficulty breathing--STAT)? No  4. What is your medication issue? Patient stated when it went to pick up his new medication, the pharmacy stated he has not been taking the Hydralazine  since July. Please advise.

## 2024-01-20 NOTE — Patient Instructions (Signed)
 Medication Instructions:  STOP HYDRALAZINE  25MG   START AMLODIPINE 5 MG IN THE EVENING *If you need a refill on your cardiac medications before your next appointment, please call your pharmacy*    Follow-Up: At [facility], you and your health needs are our priority.  As part of our continuing mission to provide you with exceptional heart care, our providers are all part of one team.  This team includes your primary Cardiologist (physician) and Advanced Practice Providers or APPs (Physician Assistants and Nurse Practitioners) who all work together to provide you with the care you need, when you need it.  Your next appointment:   3 month(s)  Provider:   Alm Clay, MD   We recommend signing up for the patient portal called MyChart.  Sign up information is provided on this After Visit Summary.  MyChart is used to connect with patients for Virtual Visits (Telemedicine).  Patients are able to view lab/test results, encounter notes, upcoming appointments, etc.  Non-urgent messages can be sent to your provider as well.   To learn more about what you can do with MyChart, go to [email or web address].   Other Instructions PLEASE CHECK YOUR WEIGHT AND BLOOD PRESSURE DAILY.

## 2024-01-20 NOTE — Telephone Encounter (Signed)
 Thank you for the update. Can we please update his med list? And we can change the amlodipine from 5 mg at night to 2.5 mg at night. Please reach out to the pharmacy and they are making pill packs.   Thank you for your help.  Angie

## 2024-01-20 NOTE — Telephone Encounter (Signed)
 Spoke with pt. Pt stated he has not been taking hydralazine  since July. Stated pharmacy and discharge paper from PCP comfirm. Pt wanted to verify with Jon Hails, PA that he does need to start amlodipine 5 mg before he started taking. Advised we would confirm and get back to him. Pt stated understanding.

## 2024-01-21 ENCOUNTER — Other Ambulatory Visit: Payer: Self-pay

## 2024-01-21 MED ORDER — AMLODIPINE BESYLATE 2.5 MG PO TABS: ORAL_TABLET | ORAL | 3 refills | Status: AC

## 2024-01-21 NOTE — Telephone Encounter (Signed)
 Noted. Sent a message to pts pharmacy with details on medication Amlodipine 2.5 mg nightly. Spoke with pt and his pharmacy has contacted him about the dose change. Pt will receive pill packs with Amlodipine 2.5 mg next week and understands the dose change.

## 2024-04-30 ENCOUNTER — Ambulatory Visit: Admitting: Cardiology
